# Patient Record
Sex: Female | Born: 1985 | Race: Black or African American | Hispanic: No | Smoking: Current every day smoker
Health system: Southern US, Community
[De-identification: ages and names within clinical notes are randomized; demographics above are authoritative.]

## PROBLEM LIST (undated history)

## (undated) ENCOUNTER — Inpatient Hospital Stay (HOSPITAL_COMMUNITY): Payer: Self-pay

## (undated) DIAGNOSIS — E785 Hyperlipidemia, unspecified: Secondary | ICD-10-CM

## (undated) DIAGNOSIS — F319 Bipolar disorder, unspecified: Secondary | ICD-10-CM

## (undated) DIAGNOSIS — B999 Unspecified infectious disease: Secondary | ICD-10-CM

## (undated) DIAGNOSIS — F419 Anxiety disorder, unspecified: Secondary | ICD-10-CM

## (undated) DIAGNOSIS — I209 Angina pectoris, unspecified: Secondary | ICD-10-CM

## (undated) DIAGNOSIS — Z9289 Personal history of other medical treatment: Secondary | ICD-10-CM

## (undated) DIAGNOSIS — E0500 Thyrotoxicosis with diffuse goiter without thyrotoxic crisis or storm: Secondary | ICD-10-CM

## (undated) DIAGNOSIS — I219 Acute myocardial infarction, unspecified: Secondary | ICD-10-CM

## (undated) DIAGNOSIS — R000 Tachycardia, unspecified: Secondary | ICD-10-CM

## (undated) DIAGNOSIS — D649 Anemia, unspecified: Secondary | ICD-10-CM

## (undated) DIAGNOSIS — F329 Major depressive disorder, single episode, unspecified: Secondary | ICD-10-CM

## (undated) DIAGNOSIS — L732 Hidradenitis suppurativa: Secondary | ICD-10-CM

## (undated) DIAGNOSIS — I10 Essential (primary) hypertension: Secondary | ICD-10-CM

## (undated) DIAGNOSIS — E079 Disorder of thyroid, unspecified: Secondary | ICD-10-CM

## (undated) DIAGNOSIS — G629 Polyneuropathy, unspecified: Secondary | ICD-10-CM

## (undated) DIAGNOSIS — I2510 Atherosclerotic heart disease of native coronary artery without angina pectoris: Secondary | ICD-10-CM

## (undated) DIAGNOSIS — E0590 Thyrotoxicosis, unspecified without thyrotoxic crisis or storm: Secondary | ICD-10-CM

## (undated) DIAGNOSIS — F32A Depression, unspecified: Secondary | ICD-10-CM

## (undated) HISTORY — DX: Thyrotoxicosis with diffuse goiter without thyrotoxic crisis or storm: E05.00

## (undated) HISTORY — DX: Hyperlipidemia, unspecified: E78.5

## (undated) HISTORY — PX: DILATION AND CURETTAGE OF UTERUS: SHX78

## (undated) HISTORY — PX: ADENOIDECTOMY: SUR15

## (undated) HISTORY — DX: Personal history of other medical treatment: Z92.89

## (undated) HISTORY — DX: Atherosclerotic heart disease of native coronary artery without angina pectoris: I25.10

## (undated) HISTORY — DX: Disorder of thyroid, unspecified: E07.9

## (undated) HISTORY — DX: Tachycardia, unspecified: R00.0

## (undated) HISTORY — PX: APPENDECTOMY: SHX54

## (undated) HISTORY — PX: TONSILLECTOMY: SUR1361

## (undated) HISTORY — PX: OTHER SURGICAL HISTORY: SHX169

## (undated) HISTORY — DX: Thyrotoxicosis, unspecified without thyrotoxic crisis or storm: E05.90

---
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
## 2009-05-18 ENCOUNTER — Emergency Department (HOSPITAL_COMMUNITY): Admission: EM | Admit: 2009-05-18 | Discharge: 2009-05-18 | Payer: Self-pay | Admitting: Emergency Medicine

## 2009-05-21 ENCOUNTER — Encounter (INDEPENDENT_AMBULATORY_CARE_PROVIDER_SITE_OTHER): Payer: Self-pay | Admitting: Family Medicine

## 2009-05-21 ENCOUNTER — Inpatient Hospital Stay (HOSPITAL_COMMUNITY): Admission: EM | Admit: 2009-05-21 | Discharge: 2009-05-24 | Payer: Self-pay | Admitting: Emergency Medicine

## 2009-06-11 ENCOUNTER — Emergency Department (HOSPITAL_COMMUNITY): Admission: EM | Admit: 2009-06-11 | Discharge: 2009-06-11 | Payer: Self-pay | Admitting: Emergency Medicine

## 2009-11-17 ENCOUNTER — Ambulatory Visit: Payer: Self-pay | Admitting: Family Medicine

## 2009-11-17 DIAGNOSIS — E669 Obesity, unspecified: Secondary | ICD-10-CM

## 2009-11-17 DIAGNOSIS — E118 Type 2 diabetes mellitus with unspecified complications: Secondary | ICD-10-CM

## 2009-11-17 DIAGNOSIS — N898 Other specified noninflammatory disorders of vagina: Secondary | ICD-10-CM | POA: Insufficient documentation

## 2009-11-17 DIAGNOSIS — I10 Essential (primary) hypertension: Secondary | ICD-10-CM | POA: Insufficient documentation

## 2009-11-17 DIAGNOSIS — J45909 Unspecified asthma, uncomplicated: Secondary | ICD-10-CM | POA: Insufficient documentation

## 2009-11-17 LAB — CONVERTED CEMR LAB
ALT: 12 units/L (ref 0–35)
AST: 14 units/L (ref 0–37)
Albumin: 4.2 g/dL (ref 3.5–5.2)
Alkaline Phosphatase: 147 units/L — ABNORMAL HIGH (ref 39–117)
BUN: 8 mg/dL (ref 6–23)
Basophils Absolute: 0 10*3/uL (ref 0.0–0.1)
Beta hcg, urine, semiquantitative: NEGATIVE
Bilirubin Urine: NEGATIVE
Blood Glucose, Fingerstick: 144
Blood in Urine, dipstick: NEGATIVE
Hgb A1c MFr Bld: 6.9 %
Ketones, urine, test strip: NEGATIVE
Lymphocytes Relative: 43 % (ref 12–46)
Lymphs Abs: 3.4 10*3/uL (ref 0.7–4.0)
Neutro Abs: 3.8 10*3/uL (ref 1.7–7.7)
Neutrophils Relative %: 49 % (ref 43–77)
Platelets: 357 10*3/uL (ref 150–400)
Potassium: 4.3 meq/L (ref 3.5–5.3)
RDW: 14.4 % (ref 11.5–15.5)
Rapid HIV Screen: NEGATIVE
Sodium: 139 meq/L (ref 135–145)
Specific Gravity, Urine: >=1.03
Total Protein: 7.2 g/dL (ref 6.0–8.3)
Vit D, 25-Hydroxy: 12 ng/mL — ABNORMAL LOW (ref 30–89)
WBC: 7.8 10*3/uL (ref 4.0–10.5)

## 2009-11-18 ENCOUNTER — Ambulatory Visit: Payer: Self-pay | Admitting: Family Medicine

## 2009-11-18 DIAGNOSIS — K219 Gastro-esophageal reflux disease without esophagitis: Secondary | ICD-10-CM | POA: Insufficient documentation

## 2009-11-18 DIAGNOSIS — N920 Excessive and frequent menstruation with regular cycle: Secondary | ICD-10-CM

## 2009-11-18 DIAGNOSIS — N760 Acute vaginitis: Secondary | ICD-10-CM | POA: Insufficient documentation

## 2009-11-18 DIAGNOSIS — E559 Vitamin D deficiency, unspecified: Secondary | ICD-10-CM | POA: Insufficient documentation

## 2009-11-18 LAB — CONVERTED CEMR LAB
Cholesterol: 176 mg/dL (ref 0–200)
HDL: 37 mg/dL — ABNORMAL LOW (ref >39)
Triglycerides: 62 mg/dL (ref <150)
VLDL: 12 mg/dL (ref 0–40)

## 2009-11-22 ENCOUNTER — Encounter (INDEPENDENT_AMBULATORY_CARE_PROVIDER_SITE_OTHER): Payer: Self-pay | Admitting: Family Medicine

## 2009-12-08 ENCOUNTER — Ambulatory Visit: Payer: Self-pay | Admitting: Family Medicine

## 2009-12-08 DIAGNOSIS — E7800 Pure hypercholesterolemia, unspecified: Secondary | ICD-10-CM

## 2009-12-08 DIAGNOSIS — L732 Hidradenitis suppurativa: Secondary | ICD-10-CM

## 2009-12-08 LAB — CONVERTED CEMR LAB: Blood Glucose, Fingerstick: 179

## 2009-12-19 ENCOUNTER — Telehealth (INDEPENDENT_AMBULATORY_CARE_PROVIDER_SITE_OTHER): Payer: Self-pay | Admitting: *Deleted

## 2009-12-28 ENCOUNTER — Ambulatory Visit: Payer: Self-pay | Admitting: Family Medicine

## 2010-01-05 ENCOUNTER — Encounter (INDEPENDENT_AMBULATORY_CARE_PROVIDER_SITE_OTHER): Payer: Self-pay | Admitting: Family Medicine

## 2011-01-21 ENCOUNTER — Encounter: Payer: Self-pay | Admitting: Family Medicine

## 2011-01-30 NOTE — Letter (Signed)
Summary: TEST ORDER FORM//[facility]TRASOUND//MENORRHAGIA//APPT DATE & TIME  TEST ORDER FORM//ULTRASOUND//MENORRHAGIA//APPT DATE & TIME   Imported By: Arta Bruce 01/25/2010 10:15:02  _____________________________________________________________________  External Attachment:    Type:   Image     Comment:   External Document

## 2011-01-30 NOTE — Letter (Signed)
Summary: Discharge Summary  Discharge Summary   Imported By: Arta Bruce 01/03/2010 14:25:19  _____________________________________________________________________  External Attachment:    Type:   Image     Comment:   External Document

## 2011-01-30 NOTE — Letter (Signed)
Summary: TEST ORDER FROM//ULTRASOUND//APPT DATE & TIME  TEST ORDER FROM//ULTRASOUND//APPT DATE & TIME   Imported By: Arta Bruce 03/13/2010 16:37:07  _____________________________________________________________________  External Attachment:    Type:   Image     Comment:   External Document

## 2011-04-10 LAB — PHOSPHORUS: Phosphorus: 3.3 mg/dL (ref 2.3–4.6)

## 2011-04-10 LAB — URINALYSIS, ROUTINE W REFLEX MICROSCOPIC
Ketones, ur: NEGATIVE mg/dL
Leukocytes, UA: NEGATIVE
Nitrite: NEGATIVE
Protein, ur: NEGATIVE mg/dL
pH: 5.5 (ref 5.0–8.0)

## 2011-04-10 LAB — CBC
Platelets: 298 10*3/uL (ref 150–400)
RDW: 13.1 % (ref 11.5–15.5)

## 2011-04-10 LAB — GLUCOSE, CAPILLARY
Glucose-Capillary: 117 mg/dL — ABNORMAL HIGH (ref 70–99)
Glucose-Capillary: 140 mg/dL — ABNORMAL HIGH (ref 70–99)
Glucose-Capillary: 142 mg/dL — ABNORMAL HIGH (ref 70–99)
Glucose-Capillary: 197 mg/dL — ABNORMAL HIGH (ref 70–99)

## 2011-04-10 LAB — POCT I-STAT, CHEM 8
Creatinine, Ser: 0.7 mg/dL (ref 0.4–1.2)
Hemoglobin: 13.6 g/dL (ref 12.0–15.0)
Sodium: 140 mEq/L (ref 135–145)
TCO2: 22 mmol/L (ref 0–100)

## 2011-04-10 LAB — CULTURE, BLOOD (ROUTINE X 2): Culture: NO GROWTH

## 2011-04-10 LAB — DIFFERENTIAL
Basophils Absolute: 0 10*3/uL (ref 0.0–0.1)
Lymphocytes Relative: 19 % (ref 12–46)
Neutro Abs: 7.1 10*3/uL (ref 1.7–7.7)
Neutrophils Relative %: 74 % (ref 43–77)

## 2011-04-10 LAB — URINE CULTURE

## 2011-04-10 LAB — MAGNESIUM: Magnesium: 1.8 mg/dL (ref 1.5–2.5)

## 2011-04-10 LAB — LIPID PANEL: Triglycerides: 94 mg/dL (ref <150)

## 2011-04-10 LAB — COMPREHENSIVE METABOLIC PANEL
ALT: 14 U/L (ref 0–35)
Calcium: 8.9 mg/dL (ref 8.4–10.5)
GFR calc Af Amer: >60 mL/min (ref >60)
Glucose, Bld: 139 mg/dL — ABNORMAL HIGH (ref 70–99)
Sodium: 141 mEq/L (ref 135–145)
Total Protein: 6.2 g/dL (ref 6.0–8.3)

## 2011-04-10 LAB — PROTIME-INR: Prothrombin Time: 13.1 seconds (ref 11.6–15.2)

## 2011-04-10 LAB — APTT: aPTT: 37 seconds (ref 24–37)

## 2011-04-10 LAB — HEMOGLOBIN A1C: Hgb A1c MFr Bld: 7.3 % — ABNORMAL HIGH (ref 4.6–6.1)

## 2011-04-10 LAB — TSH: TSH: 1.776 u[IU]/mL (ref 0.350–4.500)

## 2011-05-15 NOTE — Discharge Summary (Signed)
Michelle Osborn, Michelle Osborn NO.:  [id number]   MEDICAL RECORD NO.:  [id number]          PATIENT TYPE:  INP   LOCATION:  5152                         FACILITY:  [facility]   PHYSICIAN:  Charlestine Massed, MDDATE OF BIRTH:  Dec 04, 1986   DATE OF ADMISSION:  05/21/2009  DATE OF DISCHARGE:  05/24/2009                               DISCHARGE SUMMARY   PRIMARY CARE PHYSICIAN:  [facility]   ENT:  Jefry H. Pollyann Kennedy, MD, [facility].   DISCHARGE DIAGNOSES:  1. Bilateral otitis externa and bilateral otitis media with no      evidence of cholesteatoma.  2. Bilateral temporomandibular joint dysfunction, left more than      right.  3. Diabetes mellitus.  4. Hypertension.  5. Obesity.  6. History of asthma.   CURRENT DISCHARGE MEDICATIONS:  1. Clindamycin 600 mg p.o. t.i.d. for 3 days.  2. Levaquin 500 mg p.o. daily for 3 days.  3. Zyrtec 10 mg p.o. daily x2 weeks.  4. Cortisporin ear drops 3 drops to both ears 3 times per day for 2      more days.   The existing medications to be continued are:  1. Lisinopril 20 mg p.o. daily.  2. Metformin 1000 mg p.o. b.i.d.  3. Advair 250/50 one puff b.i.d.  4. Albuterol MDI 2 puffs twice daily p.r.n. for wheeze.  5. Glucotrol XL 2.5 mg p.o. daily.  6. Trazodone 100 mg p.o. at bedtime.   ALLERGIES:  She is allergic to PENICILLIN.  She gets anaphylaxis with  PENICILLIN.  She was admitted 5 years ago in a general hospital when she  was given some form of PENICILLIN and she had severe wheezing and  extreme chest discomfort and was given nebulizers and treated for that,  so she should be labeled as severe allergies to PENICILLIN.  She is also  allergic to PEANUTS.   HOSPITAL COURSE:  1. Ear infection.  CAT scan and ENT examination was done.  The      conclusion, she has bilateral otitis externa and bilateral otitis      media with effusion and bilateral mastoiditis.  She has been seen      by ENT, Dr. Pollyann Kennedy, who advised her  to continue Cortisporin ear      drops for 2 more days for a total of 3 days, and to see him back in      the office in 1 month's time.  He has given his card to her and she      has been advised to call and make appointment.  She will also      continue clindamycin for 3 more days.  She has completed 2 days of      antibiotics IV and she will also continue Levaquin for 3 more days.      She has completed 2 days IV.  She has also been advised to take      Zyrtec for 2 weeks because of eustachian catarrh and she is feeling      a lot better.  Auto-insufflation exercises have been taught by the  ENT physician and she is currently doing that and she is clinically      improved.  Medication adherence has been taught to her and she      exhibits understanding.  2. Bilateral temporomandibular joint dysfunction.  The patient needs      to be followed up with a dentist for further followup as per ENT.      The patient exhibits understanding and she will make an appointment      after she goes back.  3. Tobacco abuse.  The patient has been educated very well about      stopping smoking and she exhibits understanding.  She said she will      stop it.  4. Diabetes mellitus.  Blood sugars are currently controlled.      Continue same metformin and Glucotrol XL.  5. History of asthma.  Continue Advair 250/50 daily.  Finally, her      status is stable.  6. Hypertension.  Blood pressure is controlled.  Continue lisinopril      daily.  7. Questionable small dot on her right side of the posterior abdominal      wall.  The patient does not know whether she had any tick bite or      anything, but she has a small area with a dot with a punctum in the      middle and there is some exudation as well as punctum with erythema      around.  I have given her 1 dose of doxycycline.  She is not      homeless.  She has a house and she has a very remote chance of any      tick bite.  I strongly think it could be  a sebaceous cyst which      could be possibly infected and should get resolved by antibiotics,      and she has been advised to follow up with Dr. Despina Arias, also report      to the [facility] for any further followup on that.      Currently, no active management needed on that.   DISPOSITION:  Discharged back home.   FOLLOWUP:  1. Follow up with [facility] at the next appointment available.  2. Follow up with Dr. Pollyann Kennedy in 1 month.  Call [phone] for      appointment.  3. Follow up with dentist for TMJ issues.   A total of 40 minutes spent on this discharge.      Charlestine Massed, MD  Electronically Signed     UT/MEDQ  D:  05/24/2009  T:  05/25/2009  Job:  [id number]   cc:   Jeannett Senior. Pollyann Kennedy, MD  [facility]

## 2011-05-15 NOTE — Consult Note (Signed)
[name], Michelle Osborn               ACCOUNT NO.:  [id number]   MEDICAL RECORD NO.:  [id number]          PATIENT TYPE:  INP   LOCATION:  5152                         FACILITY:  [facility]   PHYSICIAN:  Jefry H. Pollyann Kennedy, MD     DATE OF BIRTH:  06-23-1986   DATE OF CONSULTATION:  05/23/2009  DATE OF DISCHARGE:                                 CONSULTATION   REASON FOR CONSULTATION:  Mastoiditis.   HISTORY:  This is a 25 year old female who was admitted to the hospital  for severe ear pain and otitis media that was not clearing on  antibiotic.  Prior to Wednesday, she was doing well and then on  Wednesday, she started to develop some hearing loss bilaterally with  bilateral ear pain, pain on chewing, and was initially treated with  antibiotics as an outpatient, but she was noncompliant with her  antibiotic and was then return to the emergency room where she was  admitted for continued treatment.   PAST MEDICAL HISTORY:  Significant for diabetes, hypertension, obesity.  She has a long history of itching of the ears and using Q-tips to  scratch.  She underwent a CT scan of the temporal bone, which revealed  fluid in the mastoid bilaterally and in the middle ear.  There is no  evidence coalescence or any bony destruction to suggest cholesteatoma.   PHYSICAL EXAMINATION:  She is an obese young lady in no distress.  No  palpable neck masses.  She has significant hirsutism.  The oral cavity  and pharynx are clear.  Nasal exam unremarkable.  Ears:  The ear canals  on both sides are worse, on the left reveal some surface exudate and  moist desquamation.  There is no granulation tissue identified.  The  tympanic membranes are intact and there is serous effusion bilaterally.  There is no evidence of injection or erythema of the drums.  There is no  tenderness of the mastoid, although she is tender in the left auricle.  She is also severely tender in the TMJ bilaterally, but worse on the  left side.   When she opens her mouth, she is exquisitely tender in the  left TMJ.   IMPRESSION:  Bilateral external otitis, worse on the left, bilateral  otitis media, serous without evidence of cholesteatoma clinically or  radiographically.  There is also evidence of bilateral temporomandibular  joint dysfunction, also worse on the left.  Recommend she stop using Q-  tips or any other instruments in the ears.  Recommend Cortisporin drops  for the next 3-5 days to treat the external otitis.  Recommend auto  inflation exercises for the middle ear effusion.  Recommend she consult  with a dentist for the TMJ dysfunction.  She has not been to a dentist  in quite some time.  Recommend she find a dentist and have a complete  dental examination.  Recommend a followup with me in about 1 month in  the office.      Jefry H. Pollyann Kennedy, MD  Electronically Signed     JHR/MEDQ  D:  05/23/2009  T:  05/24/2009  Job:  [id number]

## 2011-05-15 NOTE — H&P (Signed)
Michelle Osborn, Michelle Osborn NO.:  [id number]   MEDICAL RECORD NO.:  [id number]          PATIENT TYPE:  INP   LOCATION:  1824                         FACILITY:  [facility]   PHYSICIAN:  Manus Gunning, MD      DATE OF BIRTH:  [birth year]   DATE OF ADMISSION:  05/21/2009  DATE OF DISCHARGE:                              HISTORY & PHYSICAL   CHIEF COMPLAINT:  Bilateral ear pain and headache.   HISTORY OF PRESENT ILLNESS:  Michelle Osborn is a pleasant 25 year old African  American female who presented to the ED this past Wednesday at which  time she was diagnosed with otitis media, treated with doxycycline and  prescribed Percocet for pain.  Unfortunately, she has been taking these  medications, despite which she continues to experience pain which  gradually progressed to the right side of her ear as well.  It pains her  to move her head, pains her to chew and she has begun to experience  nausea and vomiting.  She presented to the emergency department at which  time her temperature was measured at 100.5 degrees Fahrenheit, and a CT  scan of her head demonstrated bilateral otitis media with mastoiditis,  left greater than right, with no definite signs to suggest  cholesteatoma.  The patient, herself, does complain of headache.  No  syncope, no presyncope, lightheadedness, no odynophagia, dysphagia. Does  have painful chewing, trismus. No muscle spasms.  No blurring of vision.  She does complain of superficial chest pain as the pain radiated  bilaterally but correlates this with worsening pain as it spread from  the left side to the right side.  She denies shortness of breath.  Denies dyspnea on exertion and PND.  Denies orthopnea and no  palpitations.  No other musculoskeletal complaints, abdominal pain,  positive nausea and vomiting starting to day x1 episode. No dysuria,  polyuria, hematuria.  No bright red blood per rectum. No melenic stools.  No diarrhea or constipation.   PAST  MEDICAL/SURGICAL HISTORY:  1. History of asthma.  2. Diabetes mellitus type 2.  3. Hypertension.  4. Tonsillectomy.  5. Adenoidectomy.  6. Appendectomy.   ALLERGIES:  PENICILLIN.   SOCIAL HISTORY:  Smokes 1 pack which lasts her for 4 days.  Smoking for  approximately 6 years.  Denies any illicit drug use.  Drinks socially  only.  Is currently married, no children.   FAMILY HISTORY:  Mother had asthma and hypertension.  Father had  hypertension.   HOME MEDICATIONS:  1. Metformin 1000 mg twice daily.  2. Lisinopril 20 mg daily.  3. Advair 250/50 mcg one puff daily.  4. Albuterol meter dose inhaler 2 puffs twice daily.  5. Glucotrol XL 2.5 mg p.o. daily (dose patient is not sure of).   REVIEW OF SYSTEMS:  Essentially a 14-point review of systems is  performed. Pertinent positives and negatives as described above.   PHYSICAL EXAMINATION:  VITAL SIGNS:  At the time of presentation,  temperature 100.5, heart rate 106, respiratory rate 20, blood pressure  142/94,  O2 saturation is 99% in room air.  GENERAL:  Well-developed, well-nourished African American lady lying in  bed comfortably in no apparent distress.  HEENT:  Normocephalic, atraumatic.  Does complain of bilateral ear pain  on palpation.  Has palpable lymphadenopathy in the jugular site as well  as retroauricular.  Eyes anicteric.  Extraocular muscles are intact.  Pupils are equal, react to light and accommodation.  CARDIOVASCULAR:  S1 and S2 normal, regular rate and rhythm.  No murmurs,  rubs or gallops.  RESPIRATORY:  Air entry bilaterally equal.  No rales, rhonchi or wheezes  appreciated.  ABDOMEN:  Soft and nontender.  Nondistended.  Positive bowel sounds. No  organomegaly.  EXTREMITIES:  No clubbing, cyanosis, or edema.  Positive bilateral  dorsalis pedis pulses.  CNS:  Alert and oriented x3.  Cranial nerves II through XII grossly  intact.  Power, sensation and reflexes bilaterally symmetrical.  SKIN:  No  breakdown, swelling, ulceration or masses.  HEMATOLOGY/ONCOLOGY:  No hepatosplenomegaly, no ecchymoses, bruising or  petechia.  Positive retroauricular and jugular lymphadenopathy.   LABORATORY DATA:  Urinalysis demonstrates 0-2 rbc's, 3-6 wbc's with a  negative urinalysis.  CT scan of the orbits, temporal bones demonstrates  bilateral otitis media and mastoiditis, left greater than right.  Chest  x-ray, 2 views, demonstrates no active cardiopulmonary disease.  PTT is  37, INR 1, pro time 13.1.  Sodium 140, potassium 4, chloride 107, BUN 5,  creatinine 0.7, glucose 124, calcium 1.11.  Hemoglobin 13.6, hematocrit  40, WBC 9,600 and platelet count 298,000 with 74% polymorphs, no  bandemia.   ASSESSMENT AND PLAN:  1. The patient has otitis media with mastoiditis, failed outpatient      therapy.  Start Rocephin 2 grams IV q.24 hours and clindamycin 450      mg IV q.6 hours.  I will have to confirm and make sure that the      patient is not allergic to Keflex as the patient, during my      interview, claimed only that she was allergic to penicillins but      apparently Keflex is on her  chart.  If this is the case, we will      discontinue Rocephin as well and continue only clindamycin. The      patient will need an ENT consultation in the morning.  At this      time, no steroids will be prescribed.  This can be addressed by      ENT.  2. Diabetes mellitus type 2.  Check hemoglobin A1c.  Patient placed on      sliding scale insulin protocol, sensitive scale.  Continue      metformin and Glucotrol. Check a fasting lipid profile as well.  3. Gastrointestinal and deep vein thrombosis prophylaxis:  Protonix 40      mg p.o. daily and heparin 5000 units subcu q.8 hours.      Manus Gunning, MD  Electronically Signed     SP/MEDQ  D:  05/21/2009  T:  05/22/2009  Job:  [id number]

## 2011-05-18 NOTE — Op Note (Signed)
NAME:  Michelle Osborn, Michelle Osborn                         ACCOUNT NO.:  [id number]   MEDICAL RECORD NO.:  [id number]                   PATIENT TYPE:  AMB   LOCATION:  DSC                                  FACILITY:  [facility]   PHYSICIAN:  Kinnie Scales. Annalee Genta, M.D.            DATE OF BIRTH:  09/29/1986   DATE OF PROCEDURE:  04/22/2003  DATE OF DISCHARGE:                                 OPERATIVE REPORT   PREOPERATIVE DIAGNOSIS:  1. Recurrent acute tonsillitis.  2. Adenotonsillar hypertrophy.   POSTOPERATIVE DIAGNOSIS:  1. Recurrent acute tonsillitis.  2. Adenotonsillar hypertrophy.   OPERATION PERFORMED:  Tonsillectomy and adenoidectomy (adenoid ablation).   SURGEON:  Kinnie Scales. Annalee Genta, M.D.   ANESTHESIA:  General endotracheal.   COMPLICATIONS:  None.   ESTIMATED BLOOD LOSS:  Minimal.   The patient was transferred from the operating room to the recovery room in  stable condition.  The patient will follow up in my office in two weeks for  postoperative care.   INDICATIONS FOR PROCEDURE:  The patient is an almost 25 year old black  female who was referred for evaluation of significant adenotonsillar  hypertrophy, recurrent tonsillitis, snoring with possible mild obstructive  sleep apnea.  The patient had a past medical history of mild reactive airway  disease, obesity and borderline hypertension. Given her history and  examination, I recommended that we consider her for tonsillectomy and  adenoidectomy under general anesthesia.  The risks, benefits and possible  complications of these procedures were discussed in detail with the patient  and her mother, who understood and concurred with our plan for surgery which  is scheduled for April 22, 2003.   DESCRIPTION OF PROCEDURE:  The patient was brought to the operating room on  April 22, 2003 at [facility].  General endotracheal  anesthesia was established without difficulty.  When the patient was  adequately  anesthetized, the Crowe-Davis mouth gag was inserted.  There are  no loose or broken teeth and the hard and soft palate were intact.  The  procedure was begun with adenoidectomy.  Beginning on the patient's left  hand side, using the [employer] scalpel and dissecting in subcapsular  fashion, the entire left tonsil from superior pole to tongue base was  resected.  The right tonsil was removed in a similar fashion and tonsillar  tissue was sent to pathology for gross microscopic evaluation.  The tonsil  was removed.  Access was gained to the posterior nasopharynx.  A red rubber  catheter was inserted without difficulty through the right nasal chamber and  Bovie suction cautery was used to ablate the adenoid tissue.  With complete  ablation of the adenoids, the nasopharynx was widely patent.  There was no  evidence of active bleeding.  The patient's oral cavity, oropharynx, nasal  cavity and nasopharynx were irrigated with saline solution and suctioned.  A  dry tonsil sponge was used to gently  abrade the tonsillar fossae and several  small areas of bleeding which were then cauterized with suction cautery.  An  orogastric tube was passed and the stomach contents were aspirated.  The  Crowe-Davis mouth gag was released and reapplied.  There was no active  bleeding.  The mouth gag was removed.  There were no loose or broken teeth.  The patient was then awakened from her anesthetic, extubated and was  transferred from the operating room to the recovery room in stable  condition.  There were no complications.  The estimated blood loss was  minimal.                                               Onalee Hua L. Annalee Genta, M.D.    DLS/MEDQ  D:  16/10/9603  T:  04/22/2003  Job:  [id number]

## 2012-02-05 ENCOUNTER — Emergency Department (HOSPITAL_COMMUNITY)
Admission: EM | Admit: 2012-02-05 | Discharge: 2012-02-05 | Disposition: A | Discharge status: Home / Self Care | Payer: Self-pay | Attending: Emergency Medicine | Admitting: Emergency Medicine

## 2012-02-05 ENCOUNTER — Encounter (HOSPITAL_COMMUNITY): Payer: Self-pay | Admitting: Emergency Medicine

## 2012-02-05 ENCOUNTER — Emergency Department (HOSPITAL_COMMUNITY): Payer: Self-pay

## 2012-02-05 DIAGNOSIS — R300 Dysuria: Secondary | ICD-10-CM | POA: Insufficient documentation

## 2012-02-05 DIAGNOSIS — F17200 Nicotine dependence, unspecified, uncomplicated: Secondary | ICD-10-CM | POA: Insufficient documentation

## 2012-02-05 DIAGNOSIS — E119 Type 2 diabetes mellitus without complications: Secondary | ICD-10-CM | POA: Insufficient documentation

## 2012-02-05 DIAGNOSIS — R1031 Right lower quadrant pain: Secondary | ICD-10-CM | POA: Insufficient documentation

## 2012-02-05 DIAGNOSIS — I10 Essential (primary) hypertension: Secondary | ICD-10-CM | POA: Insufficient documentation

## 2012-02-05 DIAGNOSIS — N898 Other specified noninflammatory disorders of vagina: Secondary | ICD-10-CM | POA: Insufficient documentation

## 2012-02-05 DIAGNOSIS — E669 Obesity, unspecified: Secondary | ICD-10-CM | POA: Insufficient documentation

## 2012-02-05 DIAGNOSIS — O200 Threatened abortion: Secondary | ICD-10-CM | POA: Insufficient documentation

## 2012-02-05 DIAGNOSIS — M549 Dorsalgia, unspecified: Secondary | ICD-10-CM | POA: Insufficient documentation

## 2012-02-05 DIAGNOSIS — R10819 Abdominal tenderness, unspecified site: Secondary | ICD-10-CM | POA: Insufficient documentation

## 2012-02-05 DIAGNOSIS — M79609 Pain in unspecified limb: Secondary | ICD-10-CM | POA: Insufficient documentation

## 2012-02-05 DIAGNOSIS — J45909 Unspecified asthma, uncomplicated: Secondary | ICD-10-CM | POA: Insufficient documentation

## 2012-02-05 HISTORY — DX: Essential (primary) hypertension: I10

## 2012-02-05 LAB — CBC
HCT: 34.9 % — ABNORMAL LOW (ref 36.0–46.0)
MCV: 77.4 fL — ABNORMAL LOW (ref 78.0–100.0)
Platelets: 324 10*3/uL (ref 150–400)
RBC: 4.51 MIL/uL (ref 3.87–5.11)
WBC: 7.1 10*3/uL (ref 4.0–10.5)

## 2012-02-05 LAB — COMPREHENSIVE METABOLIC PANEL
ALT: 29 U/L (ref 0–35)
Alkaline Phosphatase: 138 U/L — ABNORMAL HIGH (ref 39–117)
CO2: 22 mEq/L (ref 19–32)
Calcium: 9.4 mg/dL (ref 8.4–10.5)
Chloride: 102 mEq/L (ref 96–112)
GFR calc Af Amer: >90 mL/min (ref >90)
GFR calc non Af Amer: >90 mL/min (ref >90)
Glucose, Bld: 227 mg/dL — ABNORMAL HIGH (ref 70–99)
Sodium: 134 mEq/L — ABNORMAL LOW (ref 135–145)
Total Bilirubin: 0.3 mg/dL (ref 0.3–1.2)

## 2012-02-05 LAB — DIFFERENTIAL
Eosinophils Relative: 1 % (ref 0–5)
Lymphocytes Relative: 41 % (ref 12–46)
Lymphs Abs: 2.9 10*3/uL (ref 0.7–4.0)
Neutro Abs: 3.7 10*3/uL (ref 1.7–7.7)

## 2012-02-05 LAB — WET PREP, GENITAL

## 2012-02-05 LAB — URINALYSIS, ROUTINE W REFLEX MICROSCOPIC
Bilirubin Urine: NEGATIVE
Ketones, ur: 15 mg/dL — AB
Nitrite: NEGATIVE
Protein, ur: NEGATIVE mg/dL
Specific Gravity, Urine: 1.03 (ref 1.005–1.030)
Urobilinogen, UA: 0.2 mg/dL (ref 0.0–1.0)

## 2012-02-05 LAB — GLUCOSE, CAPILLARY: Glucose-Capillary: 203 mg/dL — ABNORMAL HIGH (ref 70–99)

## 2012-02-05 LAB — URINE MICROSCOPIC-ADD ON

## 2012-02-05 LAB — TYPE AND SCREEN

## 2012-02-05 LAB — HCG, QUANTITATIVE, PREGNANCY: hCG, Beta Chain, Quant, S: 130 m[IU]/mL — ABNORMAL HIGH (ref <5)

## 2012-02-05 MED ORDER — IBUPROFEN 200 MG PO TABS
600.0000 mg | ORAL_TABLET | Freq: Once | ORAL | Status: AC
  Administered 2012-02-05: 600 mg via ORAL
  Filled 2012-02-05: qty 3

## 2012-02-05 MED ORDER — SODIUM CHLORIDE 0.9 % IV BOLUS (SEPSIS)
500.0000 mL | Freq: Once | INTRAVENOUS | Status: AC
  Administered 2012-02-05: 500 mL via INTRAVENOUS

## 2012-02-05 MED ORDER — FENTANYL CITRATE 0.05 MG/ML IJ SOLN
50.0000 ug | Freq: Once | INTRAMUSCULAR | Status: AC
  Administered 2012-02-05: 50 ug via INTRAVENOUS
  Filled 2012-02-05: qty 2

## 2012-02-05 MED ORDER — ONDANSETRON HCL 4 MG/2ML IJ SOLN
4.0000 mg | Freq: Once | INTRAMUSCULAR | Status: AC
  Administered 2012-02-05: 4 mg via INTRAVENOUS
  Filled 2012-02-05: qty 2

## 2012-02-05 MED ORDER — ACETAMINOPHEN 500 MG PO TABS: 500.0000 mg | ORAL_TABLET | Freq: Four times a day (QID) | ORAL | Status: DC | PRN

## 2012-02-05 NOTE — ED Provider Notes (Signed)
History     CSN: [id number]  Arrival date & time 02/05/12  1151   First MD Initiated Contact with Patient 02/05/12 1159      Chief Complaint  Patient presents with  . Dysuria  . Abdominal Pain  . Back Pain    (Consider location/radiation/quality/duration/timing/severity/associated sxs/prior treatment) HPI  Patient presents to ER complaining of 2 day hx of gradual onset RLQ abdominal pain with pain in right inguinal region and radiation of pain into right thigh and down to right calf. Patient states she first noticed the pain yesterday that her persisted and worsened. Patient states pain is aggravated by movement of hip and walking, as well as touch of abdomen. Patient states pain is also aggravated by the straining of urination but denies dysuria or hematuria. She states she has a hx of appendectomy but no other abdominal surgeries. She is G0P0 but states that she is not due for her next menstrual cycle until Feb 20th but has been having light pink tinged "either vaginal discharge or pink when I wipe when I pee." Denies pelvic pain or dyspareunia but states she is sexually active. Denies fevers, chills, chest pain, SOB, n/v/d, blood in stool or dysuria. Patient has not taken anything for pain PTA. Patient states she has been dx with DM II and HTN in the past but is not currently taking any medications and is not being managed by a PCP.   Past Medical History  Diagnosis Date  . Hypertension   . Diabetes mellitus   . Asthma     Past Surgical History  Procedure Date  . Appendectomy   . Tonsillectomy   . Adenoidectomy     Family History  Problem Relation Age of Onset  . Hypertension Mother   . Hypertension Father     History  Substance Use Topics  . Smoking status: Current Everyday Smoker -- 1.0 packs/day for 10 years  . Smokeless tobacco: Not on file  . Alcohol Use: No    OB History    Grav Para Term Preterm Abortions TAB SAB Ect Mult Living                  Review  of Systems  All other systems reviewed and are negative.    Allergies  Peanut-containing drug products; Penicillins; Cephalexin; and Propoxyphene n-acetaminophen  Home Medications  No current outpatient prescriptions on file.  BP 143/95  Pulse 89  Temp(Src) 98 F (36.7 C) (Oral)  Resp 17  Ht 5\' 7"  (1.702 m)  Wt 255 lb (115.667 kg)  BMI 39.94 kg/m2  SpO2 100%  LMP 01/21/2012  Physical Exam  Nursing note and vitals reviewed. Constitutional: She is oriented to person, place, and time. She appears well-developed and well-nourished. No distress.       obese  HENT:  Head: Normocephalic and atraumatic.  Eyes: Conjunctivae are normal.  Neck: Normal range of motion. Neck supple.  Cardiovascular: Normal rate, regular rhythm, normal heart sounds and intact distal pulses.  Exam reveals no gallop and no friction rub.   No murmur heard. Pulmonary/Chest: Effort normal and breath sounds normal. No respiratory distress. She has no wheezes. She has no rales. She exhibits no tenderness.  Abdominal: Bowel sounds are normal. She exhibits no distension and no mass. There is tenderness. There is no rebound and no guarding.       TTP of entire right abdomen with RLQ TTP worse but entire right side tender. Guarding but no rigidity or peritoneal.  Genitourinary: Vagina normal. Uterus is tender. Cervix exhibits no motion tenderness, no discharge and no friability. Right adnexum displays tenderness. Right adnexum displays no fullness. Left adnexum displays no mass, no tenderness and no fullness.       Blood tinged thin cervical d/c  Musculoskeletal: Normal range of motion. She exhibits tenderness. She exhibits no edema.       TTP of right anterior thigh and inguinal crease as well as right calf but no skin changes, erythema, crepitous or edema. Good femoral pulse.   Neurological: She is alert and oriented to person, place, and time.  Skin: Skin is warm and dry. No rash noted. She is not diaphoretic. No  erythema.  Psychiatric: She has a normal mood and affect.    ED Course  Procedures (including critical care time)  IV fentanyl, zofran and fluids.   Urine pregnancy POC was ordered immediately after initial evaluation though there was a delay because ED staff sent urine to lab without dipping for pregnancy in ER. After delayed result was recognized, lab upreg was ordered but once again, prolonged time of after initial order to get result of positive.   After positive pregnancy, [place] immediately ordered. VSS. Patient is alert and oriented with pain better controlled.   Labs Reviewed  CBC - Abnormal; Notable for the following:    Hemoglobin 11.9 (*)    HCT 34.9 (*)    MCV 77.4 (*)    All other components within normal limits  COMPREHENSIVE METABOLIC PANEL - Abnormal; Notable for the following:    Sodium 134 (*)    Glucose, Bld 227 (*)    Creatinine, Ser 0.49 (*)    Alkaline Phosphatase 138 (*)    All other components within normal limits  URINALYSIS, ROUTINE W REFLEX MICROSCOPIC - Abnormal; Notable for the following:    Glucose, UA 500 (*)    Hgb urine dipstick TRACE (*)    Ketones, ur 15 (*)    All other components within normal limits  WET PREP, GENITAL - Abnormal; Notable for the following:    Clue Cells Wet Prep HPF POC MODERATE (*)    WBC, Wet Prep HPF POC FEW (*)    All other components within normal limits  GLUCOSE, CAPILLARY - Abnormal; Notable for the following:    Glucose-Capillary 203 (*)    All other components within normal limits  URINE MICROSCOPIC-ADD ON - Abnormal; Notable for the following:    Squamous Epithelial / LPF FEW (*)    Bacteria, UA FEW (*)    All other components within normal limits  PREGNANCY, URINE - Abnormal; Notable for the following:    Preg Test, Ur POSITIVE (*)    All other components within normal limits  DIFFERENTIAL  LIPASE, BLOOD  GC/CHLAMYDIA PROBE AMP, GENITAL  HCG, QUANTITATIVE, PREGNANCY  TYPE AND SCREEN  ABO/RH  PREGNANCY,  URINE   US Ob Comp Less 14 Wks  02/05/2012  *RADIOLOGY REPORT*  Clinical Data: Back pain and dysuria.  LMP 01/21/2012. +UPT with bHCG pending.  OBSTETRIC <14 WK [place] AND TRANSVAGINAL OB US  Technique:  Both transabdominal and transvaginal ultrasound examinations were performed for complete evaluation of the gestation as well as the maternal uterus, adnexal regions, and pelvic cul-de-sac.  Transvaginal technique was performed to assess early pregnancy.  Comparison:  None.  Intrauterine gestational sac:  None is seen Yolk sac: Not applicable Embryo: Not applicable Cardiac Activity: Heart Rate:  bpm  MSD:   mm  w     d CRL:    mm     w     d        [place] EDC:  Maternal uterus/adnexae: The uterus has a sagittal length of 7.6 cm, depth of 3.5 cm and width of 4.7 cm.  A homogeneous uterine myometrium is seen.  The endometrial lining appears trilayered with a width of 10 mm.  No areas of focal thickening or heterogeneity are seen and no signs of an intrauterine gestational sac is noted.  The right ovary measures 2.1 x 3.5 x 2.2 cm and contains several small follicles.  The left ovary is only seen transabdominally and is poorly resolved due to patient habitus measuring 1.8 x 2.1 x 1.7 cm.  A small amount of complex free fluid is noted in the cul-de-sac  IMPRESSION: Tri- layered endometrial stripe with no evidence for an intrauterine gestational sac.  Normal transabdominal appearance to the left ovary and normal endovaginal appearance to the right ovary.  Small amount of complex pelvic fluid.  Given the history of positive urine pregnancy test and suspected early pregnancy, this could represent and early intrauterine pregnancy which is not yet visible, a sonographically silent ectopic gestation or nonprogressing abnormal gestation. Correlation with serial beta HCG is recommended to help differentiate between these diagnostic possibilities with follow-up sonography as indicated.  We would typically expect to see evidence for  an intrauterine gestational sac with a beta HCG 3-D > 1800.  Original Report Authenticated By: Bertha Stakes, M.D.   US Ob Transvaginal  02/05/2012  *RADIOLOGY REPORT*  Clinical Data: Back pain and dysuria.  LMP 01/21/2012. +UPT with bHCG pending.  OBSTETRIC <14 WK [place] AND TRANSVAGINAL OB US  Technique:  Both transabdominal and transvaginal ultrasound examinations were performed for complete evaluation of the gestation as well as the maternal uterus, adnexal regions, and pelvic cul-de-sac.  Transvaginal technique was performed to assess early pregnancy.  Comparison:  None.  Intrauterine gestational sac:  None is seen Yolk sac: Not applicable Embryo: Not applicable Cardiac Activity: Heart Rate:  bpm  MSD:   mm      w     d CRL:    mm     w     d        [place] EDC:  Maternal uterus/adnexae: The uterus has a sagittal length of 7.6 cm, depth of 3.5 cm and width of 4.7 cm.  A homogeneous uterine myometrium is seen.  The endometrial lining appears trilayered with a width of 10 mm.  No areas of focal thickening or heterogeneity are seen and no signs of an intrauterine gestational sac is noted.  The right ovary measures 2.1 x 3.5 x 2.2 cm and contains several small follicles.  The left ovary is only seen transabdominally and is poorly resolved due to patient habitus measuring 1.8 x 2.1 x 1.7 cm.  A small amount of complex free fluid is noted in the cul-de-sac  IMPRESSION: Tri- layered endometrial stripe with no evidence for an intrauterine gestational sac.  Normal transabdominal appearance to the left ovary and normal endovaginal appearance to the right ovary.  Small amount of complex pelvic fluid.  Given the history of positive urine pregnancy test and suspected early pregnancy, this could represent and early intrauterine pregnancy which is not yet visible, a sonographically silent ectopic gestation or nonprogressing abnormal gestation. Correlation with serial beta HCG is recommended to help differentiate between these  diagnostic possibilities with follow-up sonography as indicated.  We would typically expect to see evidence for an intrauterine gestational sac with a beta HCG 3-D > 1800.  Original Report Authenticated By: Bertha Stakes, M.D.     1. Threatened abortion   2. Diabetes mellitus       MDM  Early IUP not seen on [place] vs early ectopic not visualized but patient's abdomen is non acute and her VSS. Afebrile. NAD. Spoke AT LENGTH with patient about GYN follow up and need to return to ER for changing or worsening of symptoms. She voices understanding and is agreeable to plan.         [place], [place] 02/05/12 [phone]

## 2012-02-05 NOTE — ED Notes (Signed)
Pt. Reported of pain upon urination with abdominal pain on RLQ radiating to lower back started 9pm of last night. Also claimed decreased in the amount of urine since pain felt. Denies SOB / chest pain.

## 2012-02-06 ENCOUNTER — Inpatient Hospital Stay (HOSPITAL_COMMUNITY)
Admission: AD | Admit: 2012-02-06 | Discharge: 2012-02-06 | Disposition: A | Discharge status: Home / Self Care | Payer: Self-pay | Source: Ambulatory Visit | Attending: Obstetrics & Gynecology | Admitting: Obstetrics & Gynecology

## 2012-02-06 ENCOUNTER — Encounter (HOSPITAL_COMMUNITY): Payer: Self-pay | Admitting: *Deleted

## 2012-02-06 DIAGNOSIS — O039 Complete or unspecified spontaneous abortion without complication: Secondary | ICD-10-CM | POA: Insufficient documentation

## 2012-02-06 LAB — GC/CHLAMYDIA PROBE AMP, GENITAL
Chlamydia, DNA Probe: NEGATIVE
GC Probe Amp, Genital: NEGATIVE

## 2012-02-06 LAB — CBC
HCT: 35.3 % — ABNORMAL LOW (ref 36.0–46.0)
Hemoglobin: 11.7 g/dL — ABNORMAL LOW (ref 12.0–15.0)
MCV: 79.7 fL (ref 78.0–100.0)
RBC: 4.43 MIL/uL (ref 3.87–5.11)
WBC: 8.6 10*3/uL (ref 4.0–10.5)

## 2012-02-06 LAB — HCG, QUANTITATIVE, PREGNANCY: hCG, Beta Chain, Quant, S: 91 m[IU]/mL — ABNORMAL HIGH (ref <5)

## 2012-02-06 MED ORDER — IBUPROFEN 800 MG PO TABS: 800.0000 mg | ORAL_TABLET | Freq: Three times a day (TID) | ORAL | Status: AC | PRN

## 2012-02-06 MED ORDER — IBUPROFEN 800 MG PO TABS
800.0000 mg | ORAL_TABLET | Freq: Once | ORAL | Status: AC
  Administered 2012-02-06: 800 mg via ORAL
  Filled 2012-02-06: qty 1

## 2012-02-06 MED ORDER — OXYCODONE-ACETAMINOPHEN 5-325 MG PO TABS
2.0000 | ORAL_TABLET | Freq: Once | ORAL | Status: AC
  Administered 2012-02-06: 2 via ORAL
  Filled 2012-02-06: qty 2

## 2012-02-06 MED ORDER — OXYCODONE-ACETAMINOPHEN 5-325 MG PO TABS: 1.0000 | ORAL_TABLET | ORAL | Status: AC | PRN

## 2012-02-06 MED ORDER — OXYCODONE-ACETAMINOPHEN 5-325 MG PO TABS: 2.0000 | ORAL_TABLET | Freq: Once | ORAL | Status: DC

## 2012-02-06 NOTE — ED Provider Notes (Signed)
Medical screening examination/treatment/procedure(s) were performed by non-physician practitioner and as supervising physician I was immediately available for consultation/collaboration.  Geoffery Lyons, MD 02/06/12 [phone]

## 2012-02-06 NOTE — Progress Notes (Signed)
Ivonne Andrew, CNM at bedside.  Lab results discussed with pt.

## 2012-02-06 NOTE — Progress Notes (Signed)
Pt seen at [facility] for abd pain 02/04/2012 for lower right side abd pain.  Pt was told she was pregnant and to follow up at [facility] in 2 days.  Pt continues to have RLQ pain that is constant.  Denies bleeding at this time.

## 2012-02-06 NOTE — Progress Notes (Signed)
V. Smith, CNM at bedside.  Assessment done and poc discussed with pt.  

## 2012-02-06 NOTE — ED Provider Notes (Signed)
History   Michelle Osborn is a 26 y.o. year old G1P0 female at unknown weeks gestation who presents to [facility]U reporting continued low abd pain. Pain is greater in right groin radiating down her right leg. She was seen in [facility] yesterday for the same concerns. Quant 130. Blood type O pos. She denies VB, N/V/D, fever, chills, loss of appetite. She has Hx appendectomy.  CSN: [id number]  Arrival date & time 02/06/12  1841   None     Chief Complaint  Patient presents with  . Abdominal Pain    (Consider location/radiation/quality/duration/timing/severity/associated sxs/prior treatment) HPI  Past Medical History  Diagnosis Date  . Hypertension   . Diabetes mellitus   . Asthma     Past Surgical History  Procedure Date  . Appendectomy   . Tonsillectomy   . Adenoidectomy     Family History  Problem Relation Age of Onset  . Hypertension Mother   . Hypertension Father     History  Substance Use Topics  . Smoking status: Current Everyday Smoker -- 1.0 packs/day for 10 years  . Smokeless tobacco: Not on file  . Alcohol Use: No    OB History    Grav Para Term Preterm Abortions TAB SAB Ect Mult Living   1               Review of Systems  Constitutional: Negative for fever, chills and appetite change.  Gastrointestinal: Positive for abdominal pain. Negative for nausea, vomiting, diarrhea and constipation.  Genitourinary: Negative for dysuria, flank pain and vaginal bleeding.    Allergies  Peanut-containing drug products; Penicillins; and Cephalexin  Home Medications  No current outpatient prescriptions on file.  BP 128/89  Pulse 86  Temp(Src) 98.4 F (36.9 C) (Oral)  Resp 16  Ht 5\' 7"  (1.702 m)  Wt 111.676 kg (246 lb 3.2 oz)  BMI 38.56 kg/m2  LMP 01/21/2012  Physical Exam  Constitutional: She is oriented to person, place, and time. She appears well-developed and well-nourished. She appears distressed (mild).  Cardiovascular: Normal rate.   Pulmonary/Chest: Effort  normal.  Abdominal: Soft. Bowel sounds are normal. She exhibits no distension and no mass. There is tenderness (generalized low abd). There is no rebound and no guarding.  Neurological: She is alert and oriented to person, place, and time.  Skin: Skin is warm and dry.  Psychiatric: She has a normal mood and affect.    ED Course  Procedures (including critical care time)  Labs Reviewed  HCG, QUANTITATIVE, PREGNANCY - Abnormal; Notable for the following:    hCG, Beta Chain, Quant, S 91 (*)    All other components within normal limits  CBC - Abnormal; Notable for the following:    Hemoglobin 11.7 (*)    HCT 35.3 (*)    All other components within normal limits   US Ob Comp Less 14 Wks  02/05/2012  *RADIOLOGY REPORT*  Clinical Data: Back pain and dysuria.  LMP 01/21/2012. +UPT with bHCG pending.  OBSTETRIC <14 WK [place] AND TRANSVAGINAL OB US  Technique:  Both transabdominal and transvaginal ultrasound examinations were performed for complete evaluation of the gestation as well as the maternal uterus, adnexal regions, and pelvic cul-de-sac.  Transvaginal technique was performed to assess early pregnancy.  Comparison:  None.  Intrauterine gestational sac:  None is seen Yolk sac: Not applicable Embryo: Not applicable Cardiac Activity: Heart Rate:  bpm  MSD:   mm      w     d CRL:  mm     w     d        [place] EDC:  Maternal uterus/adnexae: The uterus has a sagittal length of 7.6 cm, depth of 3.5 cm and width of 4.7 cm.  A homogeneous uterine myometrium is seen.  The endometrial lining appears trilayered with a width of 10 mm.  No areas of focal thickening or heterogeneity are seen and no signs of an intrauterine gestational sac is noted.  The right ovary measures 2.1 x 3.5 x 2.2 cm and contains several small follicles.  The left ovary is only seen transabdominally and is poorly resolved due to patient habitus measuring 1.8 x 2.1 x 1.7 cm.  A small amount of complex free fluid is noted in the cul-de-sac   IMPRESSION: Tri- layered endometrial stripe with no evidence for an intrauterine gestational sac.  Normal transabdominal appearance to the left ovary and normal endovaginal appearance to the right ovary.  Small amount of complex pelvic fluid.  Given the history of positive urine pregnancy test and suspected early pregnancy, this could represent and early intrauterine pregnancy which is not yet visible, a sonographically silent ectopic gestation or nonprogressing abnormal gestation. Correlation with serial beta HCG is recommended to help differentiate between these diagnostic possibilities with follow-up sonography as indicated.  We would typically expect to see evidence for an intrauterine gestational sac with a beta HCG 3-D > 1800.  Original Report Authenticated By: Bertha Stakes, M.D.   1. SAB (spontaneous abortion)     MDM  Pt very tearful. Has been TTC for several years. D/C home Support given F/U in [facility]U for quant on 02/09/12 SAB precautions  Dorathy Kinsman 02/06/2012 10:43 PM

## 2012-02-20 ENCOUNTER — Emergency Department (HOSPITAL_COMMUNITY)
Admission: EM | Admit: 2012-02-20 | Discharge: 2012-02-20 | Disposition: A | Discharge status: Home / Self Care | Payer: Self-pay | Attending: Emergency Medicine | Admitting: Emergency Medicine

## 2012-02-20 ENCOUNTER — Encounter (HOSPITAL_COMMUNITY): Payer: Self-pay | Admitting: *Deleted

## 2012-02-20 DIAGNOSIS — N61 Mastitis without abscess: Secondary | ICD-10-CM | POA: Insufficient documentation

## 2012-02-20 DIAGNOSIS — F17200 Nicotine dependence, unspecified, uncomplicated: Secondary | ICD-10-CM | POA: Insufficient documentation

## 2012-02-20 DIAGNOSIS — L989 Disorder of the skin and subcutaneous tissue, unspecified: Secondary | ICD-10-CM | POA: Insufficient documentation

## 2012-02-20 DIAGNOSIS — I10 Essential (primary) hypertension: Secondary | ICD-10-CM | POA: Insufficient documentation

## 2012-02-20 DIAGNOSIS — E119 Type 2 diabetes mellitus without complications: Secondary | ICD-10-CM | POA: Insufficient documentation

## 2012-02-20 DIAGNOSIS — N611 Abscess of the breast and nipple: Secondary | ICD-10-CM

## 2012-02-20 MED ORDER — SULFAMETHOXAZOLE-TRIMETHOPRIM 800-160 MG PO TABS: 2.0000 | ORAL_TABLET | Freq: Two times a day (BID) | ORAL | Status: AC

## 2012-02-20 MED ORDER — HYDROCODONE-ACETAMINOPHEN 5-325 MG PO TABS
1.0000 | ORAL_TABLET | Freq: Once | ORAL | Status: AC
  Administered 2012-02-20: 1 via ORAL
  Filled 2012-02-20: qty 1

## 2012-02-20 MED ORDER — HYDROCODONE-ACETAMINOPHEN 5-325 MG PO TABS: 1.0000 | ORAL_TABLET | ORAL | Status: AC | PRN

## 2012-02-20 NOTE — ED Notes (Signed)
Pt with multiple abscesses developiing a couple of days ago, noted in breast area and groin

## 2012-02-20 NOTE — Discharge Instructions (Signed)
Please call the surgeon listed above for a follow up appointment.  Use warm moist compresses on your abscesses every 2-3 hours.  Please see the surgeon (or return to the ER if you are unable to see the surgeon) for a recheck in 2-3 days.  You may return to the ER at any time for worsening condition or any new symptoms that concern you.

## 2012-02-21 NOTE — ED Provider Notes (Signed)
Medical screening examination/treatment/procedure(s) were performed by non-physician practitioner and as supervising physician I was immediately available for consultation/collaboration. Devoria Albe, MD, FACEP   Ward Givens, MD 02/21/12 1025

## 2012-02-21 NOTE — ED Provider Notes (Signed)
History     CSN: [id number]  Arrival date & time 02/20/12  9562   First MD Initiated Contact with Patient 02/20/12 2055      Chief Complaint  Patient presents with  . Abscess    (Consider location/radiation/quality/duration/timing/severity/associated sxs/prior treatment) HPI Comments: The patient reports she has hidradenitis suppurativa and she has developed abscesses of her right and left breast and left thigh.  The left thigh abscess opened on its own but is not healing. Reports she has had two surgeries of her axilla, but continues to get abscesses.  The surgeries were performed in [place].  Patient states since the abscesses began she has had migraines, decreased appetite, nausea, increased sleep, bilateral ear pain, metallic taste in her mouth, chills, and sweats.  The patient has also recently had a spontaneous abortion.  On the fifth of this month she was diagnosed with a threatened miscarriage, and uncontrolled diabetes.  She had beta hCG Quant of 130 at the time.  [facility] and had a Quant of 91.  States she was asked to come back to have it rechecked, but did not have the transportation to do it.  Patient states she continues to have light bleeding but never enough for a pad.  States she just has blood when she wiped.  Patient denies abdominal pain. Denies any drainage of the abscesses, denies nipple discharge.    Patient is a 26 y.o. female presenting with abscess. The history is provided by the patient.  Abscess     Past Medical History  Diagnosis Date  . Hypertension   . Diabetes mellitus   . Asthma     Past Surgical History  Procedure Date  . Appendectomy   . Tonsillectomy   . Adenoidectomy     Family History  Problem Relation Age of Onset  . Hypertension Mother   . Hypertension Father     History  Substance Use Topics  . Smoking status: Current Everyday Smoker -- 1.0 packs/day for 10 years  . Smokeless tobacco: Not on file  .  Alcohol Use: No    OB History    Grav Para Term Preterm Abortions TAB SAB Ect Mult Living   1               Review of Systems  Respiratory: Negative for shortness of breath.   Cardiovascular: Negative for chest pain.  Gastrointestinal: Negative for abdominal pain.  All other systems reviewed and are negative.    Allergies  Peanut-containing drug products; Penicillins; and Cephalexin  Home Medications   Current Outpatient Rx  Name Route Sig Dispense Refill  . HYDROCODONE-ACETAMINOPHEN 5-325 MG PO TABS Oral Take 1 tablet by mouth every 4 (four) hours as needed for pain. 20 tablet 0  . SULFAMETHOXAZOLE-TRIMETHOPRIM 800-160 MG PO TABS Oral Take 2 tablets by mouth 2 (two) times daily. 28 tablet 0    BP 148/103  Pulse 8  Temp(Src) 98.2 F (36.8 C) (Oral)  Resp 18  SpO2 100%  LMP 01/21/2012  Physical Exam  Nursing note and vitals reviewed. Constitutional: She is oriented to person, place, and time. She appears well-developed and well-nourished. No distress.  HENT:  Head: Normocephalic and atraumatic.  Neck: Neck supple.  Cardiovascular: Normal rate, regular rhythm and normal heart sounds.   Pulmonary/Chest: Breath sounds normal. No respiratory distress. She has no wheezes. She has no rales. She exhibits no tenderness.  Abdominal: Soft. Bowel sounds are normal. She exhibits no distension and  no mass. There is no tenderness. There is no rebound and no guarding.  Neurological: She is alert and oriented to person, place, and time.  Skin:     Psychiatric: She has a normal mood and affect. Her behavior is normal. Judgment and thought content normal.    ED Course  Procedures (including critical care time)   Labs Reviewed  HCG, QUANTITATIVE, PREGNANCY  HCG, QUANTITATIVE, PREGNANCY  HCG Quant is 1  No results found.   1. Abscess of right breast   2. Abscess of left breast       MDM  Nontoxic, afebrile patient with cellulitis and possible abscess of right breast  and very small induration of left breast and left thigh.  That appeared to be early abscess of the left breast and healing abscess of the left thigh.  Patient is hypertensive but in pain.  No CP, SOB, HA, AMS.  Patient may have multiple abscesses of right breast vs complex abscess, associated cellulitis.  I have strongly recommended surgical follow up in 2-3 days.  Pt to return to ED for a recheck if she is unable to see surgery for any reason.  Pt d/c home with pain medication and antibiotics.  Patient verbalizes understanding and agrees with plan.          Dillard Cannon [place], [place] 02/21/12 [phone]

## 2012-02-22 ENCOUNTER — Encounter (INDEPENDENT_AMBULATORY_CARE_PROVIDER_SITE_OTHER): Payer: Self-pay | Admitting: General Surgery

## 2012-03-04 ENCOUNTER — Encounter (HOSPITAL_COMMUNITY): Payer: Self-pay | Admitting: Emergency Medicine

## 2012-03-04 ENCOUNTER — Emergency Department (HOSPITAL_COMMUNITY)
Admission: EM | Admit: 2012-03-04 | Discharge: 2012-03-04 | Disposition: A | Discharge status: Home / Self Care | Payer: Self-pay | Attending: Emergency Medicine | Admitting: Emergency Medicine

## 2012-03-04 DIAGNOSIS — E119 Type 2 diabetes mellitus without complications: Secondary | ICD-10-CM | POA: Insufficient documentation

## 2012-03-04 DIAGNOSIS — J029 Acute pharyngitis, unspecified: Secondary | ICD-10-CM | POA: Insufficient documentation

## 2012-03-04 DIAGNOSIS — J329 Chronic sinusitis, unspecified: Secondary | ICD-10-CM | POA: Insufficient documentation

## 2012-03-04 DIAGNOSIS — I10 Essential (primary) hypertension: Secondary | ICD-10-CM | POA: Insufficient documentation

## 2012-03-04 DIAGNOSIS — R51 Headache: Secondary | ICD-10-CM | POA: Insufficient documentation

## 2012-03-04 DIAGNOSIS — H9209 Otalgia, unspecified ear: Secondary | ICD-10-CM | POA: Insufficient documentation

## 2012-03-04 LAB — GLUCOSE, CAPILLARY: Glucose-Capillary: 233 mg/dL — ABNORMAL HIGH (ref 70–99)

## 2012-03-04 MED ORDER — METFORMIN HCL 1000 MG PO TABS: 1000.0000 mg | ORAL_TABLET | Freq: Two times a day (BID) | ORAL | Status: DC

## 2012-03-04 MED ORDER — HYDROCODONE-ACETAMINOPHEN 5-325 MG PO TABS: 1.0000 | ORAL_TABLET | ORAL | Status: AC | PRN

## 2012-03-04 MED ORDER — LEVOFLOXACIN 750 MG PO TABS: 750.0000 mg | ORAL_TABLET | Freq: Every day | ORAL | Status: AC

## 2012-03-04 NOTE — Discharge Instructions (Signed)
Sinusitis Sinuses are air pockets within the bones of your face. The growth of bacteria within a sinus leads to infection. The infection prevents the sinuses from draining. This infection is called sinusitis. SYMPTOMS  There will be different areas of pain depending on which sinuses have become infected.  The maxillary sinuses often produce pain beneath the eyes.   Frontal sinusitis may cause pain in the middle of the forehead and above the eyes.  Other problems (symptoms) include:  Toothaches.   Colored, pus-like (purulent) drainage from the nose.   Swelling, warmth, and tenderness over the sinus areas may be signs of infection.  TREATMENT  Sinusitis is most often determined by an exam.X-rays may be taken. If x-rays have been taken, make sure you obtain your results or find out how you are to obtain them. Your caregiver may give you medications (antibiotics). These are medications that will help kill the bacteria causing the infection. You may also be given a medication (decongestant) that helps to reduce sinus swelling.  HOME CARE INSTRUCTIONS   Only take over-the-counter or prescription medicines for pain, discomfort, or fever as directed by your caregiver.   Drink extra fluids. Fluids help thin the mucus so your sinuses can drain more easily.   Applying either moist heat or ice packs to the sinus areas may help relieve discomfort.   Use saline nasal sprays to help moisten your sinuses. The sprays can be found at your local drugstore.  SEEK IMMEDIATE MEDICAL CARE IF:  You have a fever.   You have increasing pain, severe headaches, or toothache.   You have nausea, vomiting, or drowsiness.   You develop unusual swelling around the face or trouble seeing.  MAKE SURE YOU:   Understand these instructions.   Will watch your condition.   Will get help right away if you are not doing well or get worse.  Document Released: 12/17/2005 Document Revised: 12/06/2011 Document Reviewed:  07/16/2007 [facility] Patient Information 2012 [place], [place].        Otalgia The most common reason for this in children is an infection of the middle ear. Pain from the middle ear is usually caused by a build-up of fluid and pressure behind the eardrum. Pain from an earache can be sharp, dull, or burning. The pain may be temporary or constant. The middle ear is connected to the nasal passages by a short narrow tube called the Eustachian tube. The Eustachian tube allows fluid to drain out of the middle ear, and helps keep the pressure in your ear equalized. CAUSES  A cold or allergy can block the Eustachian tube with inflammation and the build-up of secretions. This is especially likely in small children, because their Eustachian tube is shorter and more horizontal. When the Eustachian tube closes, the normal flow of fluid from the middle ear is stopped. Fluid can accumulate and cause stuffiness, pain, hearing loss, and an ear infection if germs start growing in this area. SYMPTOMS  The symptoms of an ear infection may include fever, ear pain, fussiness, increased crying, and irritability. Many children will have temporary and minor hearing loss during and right after an ear infection. Permanent hearing loss is rare, but the risk increases the more infections a child has. Other causes of ear pain include retained water in the outer ear canal from swimming and bathing. Ear pain in adults is less likely to be from an ear infection. Ear pain may be referred from other locations. Referred pain may be from the joint  between your jaw and the skull. It may also come from a tooth problem or problems in the neck. Other causes of ear pain include:  A foreign body in the ear.   Outer ear infection.   Sinus infections.   Impacted ear wax.   Ear injury.   Arthritis of the jaw or TMJ problems.   Middle ear infection.   Tooth infections.   Sore throat with pain to the ears.  DIAGNOSIS  Your  caregiver can usually make the diagnosis by examining you. Sometimes other special studies, including x-rays and lab work may be necessary. TREATMENT   If antibiotics were prescribed, use them as directed and finish them even if you or your child's symptoms seem to be improved.   Sometimes PE tubes are needed in children. These are little plastic tubes which are put into the eardrum during a simple surgical procedure. They allow fluid to drain easier and allow the pressure in the middle ear to equalize. This helps relieve the ear pain caused by pressure changes.  HOME CARE INSTRUCTIONS   Only take over-the-counter or prescription medicines for pain, discomfort, or fever as directed by your caregiver. DO NOT GIVE CHILDREN ASPIRIN because of the association of Reye's Syndrome in children taking aspirin.   Use a cold pack applied to the outer ear for 15 to 20 minutes, 3 to 4 times per day or as needed may reduce pain. Do not apply ice directly to the skin. You may cause frost bite.   Over-the-counter ear drops used as directed may be effective. Your caregiver may sometimes prescribe ear drops.   Resting in an upright position may help reduce pressure in the middle ear and relieve pain.   Ear pain caused by rapidly descending from high altitudes can be relieved by swallowing or chewing gum. Allowing infants to suck on a bottle during airplane travel can help.   Do not smoke in the house or near children. If you are unable to quit smoking, smoke outside.   Control allergies.  SEEK IMMEDIATE MEDICAL CARE IF:   You or your child are becoming sicker.   Pain or fever relief is not obtained with medicine.   You or your child's symptoms (pain, fever, or irritability) do not improve within 24 to 48 hours or as instructed.   Severe pain suddenly stops hurting. This may indicate a ruptured eardrum.   You or your children develop new problems such as severe headaches, stiff neck, difficulty  swallowing, or swelling of the face or around the ear.  Document Released: 08/03/2004 Document Revised: 12/06/2011 Document Reviewed: 12/08/2008 [facility] Patient Information 2012 [place], [place].         Diabetes, Type 2 Diabetes is a long-lasting (chronic) disease. In type 2 diabetes, the pancreas does not make enough insulin (a hormone), and the body does not respond normally to the insulin that is made. This type of diabetes was also previously called adult-onset diabetes. It usually occurs after the age of 20, but it can occur at any age.  CAUSES  Type 2 diabetes happens because the pancreasis not making enough insulin or your body has trouble using the insulin that your pancreas does make properly. SYMPTOMS   Drinking more than usual.   Urinating more than usual.   Blurred vision.   Dry, itchy skin.   Frequent infections.   Feeling more tired than usual (fatigue).  DIAGNOSIS The diagnosis of type 2 diabetes is usually made by one of the following tests:  Fasting blood glucose test. You will not eat for at least 8 hours and then take a blood test.   Random blood glucose test. Your blood glucose (sugar) is checked at any time of the day regardless of when you ate.   Oral glucose tolerance test (OGTT). Your blood glucose is measured after you have not eaten (fasted) and then after you drink a glucose containing beverage.  TREATMENT   Healthy eating.   Exercise.   Medicine, if needed.   Monitoring blood glucose.   Seeing your caregiver regularly.  HOME CARE INSTRUCTIONS   Check your blood glucose at least once a day. More frequent monitoring may be necessary, depending on your medicines and on how well your diabetes is controlled. Your caregiver will advise you.   Take your medicine as directed by your caregiver.   Do not smoke.   Make wise food choices. Ask your caregiver for information. Weight loss can improve your diabetes.   Learn about low blood  glucose (hypoglycemia) and how to treat it.   Get your eyes checked regularly.   Have a yearly physical exam. Have your blood pressure checked and your blood and urine tested.   Wear a pendant or bracelet saying that you have diabetes.   Check your feet every night for cuts, sores, blisters, and redness. Let your caregiver know if you have any problems.  SEEK MEDICAL CARE IF:   You have problems keeping your blood glucose in target range.   You have problems with your medicines.   You have symptoms of an illness that do not improve after 24 hours.   You have a sore or wound that is not healing.   You notice a change in vision or a new problem with your vision.   You have a fever.  MAKE SURE YOU:  Understand these instructions.   Will watch your condition.   Will get help right away if you are not doing well or get worse.  Document Released: 12/17/2005 Document Revised: 12/06/2011 Document Reviewed: 06/04/2011 [facility] Patient Information 2012 [place], [place].     If you have no primary doctor, here are some resources that may be helpful:  Medicaid-accepting [facility] Providers:   - [facility]- [address]      [phone]      Mon-Fri 9am-7pm, Sat 9am-1pm   - [facility]- [address] [place]      [phone]   - [facility]- [address] [place]      [phone]   [facility]- [address]      [phone]   - Renaye Rakers- [address] [place], [address]      [phone]      Only accepts [place] patients       after they have her name applied to their card   Self Pay (no insurance) in [place]:   - Sickle Cell Patients: Dr Willey Blade, [facility]      [address] [place]      [phone]   - Health [phone]   - Physician Referral Service- [phone]   - [facility]- [address]  [place]      [phone]   [facility] [place]- [address]   - [facility]- see information above      (  Speak to [facility] if you do not have insurance)   - [facility]- [address] [place]      [phone]   - [facility] [place]- [address]      [phone]   - [facility]- [address]      [phone]   - Dr Julio Sicks-  [address], [place]      [phone]   - [facility]- [address]      [phone]   - [facility]- [address]      [phone]      Also [address]      [phone]   - [facility]- [address]      [phone]      1st and 3rd Saturday every month, 10am-1pm Other agencies that provide inexpensive medical care:    [facility]  [phone]    [facility]  [phone]    [facility]  [phone]    [facility]  [phone]    [facility]  [phone]  General Information: Finding a doctor when you do not have health insurance can be tricky. Although you are not limited by an insurance plan, you are of course limited by her finances and how much but he can pay out of pocket.  What are your options if you don't have health insurance?   1) Find a Librarian, academic and Pay Out of Pocket Although you won't have to find out who is covered by your insurance plan, it is a good idea to ask around and get recommendations. You will then need to call the office and see if the doctor you have chosen will accept you as a new patient and what types of options they offer for patients who are self-pay. Some doctors offer discounts or will set up payment plans for their patients who do not have insurance, but you will need to ask so you aren't surprised when you get to your appointment.  2) Contact Your Local Health Department Not all health departments have doctors that can see patients for sick visits, but many do, so it is  worth a call to see if yours does. If you don't know where your local health department is, you can check in your phone book. The CDC also has a tool to help you locate your state's health department, and many state websites also have listings of all of their local health departments.  3) Find a Walk-in Clinic If your illness is not likely to be very severe or complicated, you may want to try a walk in clinic. These are popping up all over the country in pharmacies, drugstores, and shopping centers. They're usually staffed by nurse practitioners or physician assistants that have been trained to treat common illnesses and complaints. They're usually fairly quick and inexpensive. However, if you have serious medical issues or chronic medical problems, these are probably not your best option

## 2012-03-04 NOTE — ED Provider Notes (Signed)
History     CSN: [id number]  Arrival date & time 03/04/12  1152   First MD Initiated Contact with Patient 03/04/12 1441      Chief Complaint  Patient presents with  . Facial Swelling    (Consider location/radiation/quality/duration/timing/severity/associated sxs/prior treatment) The history is provided by the patient.   the patient is a 26 year old female with a history of diabetes, asthma, and hypertension for which she does not take her recommended medications who presents to the emergency department with the chief complaint of bilateral ear pain it is associated with facial swelling and neck pain. She reports her symptoms began a week ago with pain to both ears and have gradually progressed to radiate to the head and neck. The pain is sharp and shooting, severe, constant. She relates that in the morning, it is difficult to open her mouth but this seems to resolve as the day goes on. There is associated intermittent change in hearing, absent at time of examination. There's been no associated fever, chills, eye pain, ear drainage, nasal congestion, difficulty swallowing, change in voice, neck stiffness, chest pain, or increased shortness of breath from her baseline. Nothing makes the symptoms better or worse. No prior treatment. The patient relates a hx of mastoiditis several years ago that felt similar to this.  Past Medical History  Diagnosis Date  . Hypertension   . Diabetes mellitus   . Asthma     Past Surgical History  Procedure Date  . Appendectomy   . Tonsillectomy   . Adenoidectomy     Family History  Problem Relation Age of Onset  . Hypertension Mother   . Hypertension Father     History  Substance Use Topics  . Smoking status: Current Everyday Smoker -- 1.0 packs/day for 10 years  . Smokeless tobacco: Not on file  . Alcohol Use: No    OB History    Grav Para Term Preterm Abortions TAB SAB Ect Mult Living   1               Review of Systems 10 systems  reviewed and are negative for acute change except as noted in the HPI.  Allergies  Peanut-containing drug products; Penicillins; and Cephalexin  Home Medications  No current outpatient prescriptions on file.  BP 131/94  Pulse 76  Temp(Src) 98.8 F (37.1 C) (Oral)  Resp 22  SpO2 100%  LMP 01/21/2012  Physical Exam  Constitutional: She is oriented to person, place, and time. She appears well-developed.       Vital signs reviewed and although they were initially significant for hypertension and tachycardia, her blood pressure at the time of my evaluation is 131/94 her heart rate is 80. Patient intermittently tearful during examination. Morbidly obese  HENT:  Head: Normocephalic and atraumatic. No trismus in the jaw.  Right Ear: Hearing and external ear normal. Tympanic membrane is erythematous. A middle ear effusion is present.  Left Ear: Hearing and external ear normal. Tympanic membrane is erythematous.  Nose: Mucosal edema present. Right sinus exhibits maxillary sinus tenderness. Left sinus exhibits maxillary sinus tenderness.  Mouth/Throat: Mucous membranes are normal. Dental caries present. No dental abscesses or uvula swelling. Posterior oropharyngeal erythema present. No oropharyngeal exudate, posterior oropharyngeal edema or tonsillar abscesses.       Bilateral ear canals are mildly erythematous. Purulent mucus to bilateral nares.  Eyes: Conjunctivae are normal. Pupils are equal, round, and reactive to light.  Neck: Normal range of motion. Neck supple.  Soft tissue tenderness to the anterior neck without discrete lymphadenopathy  Cardiovascular: Normal rate, regular rhythm and normal heart sounds.   Pulmonary/Chest: Effort normal and breath sounds normal. No respiratory distress. She has no wheezes. She has no rales. She exhibits no tenderness.  Musculoskeletal: She exhibits no edema and no tenderness.  Neurological: She is alert and oriented to person, place, and time. No  cranial nerve deficit.       Normal spoken voice  Skin: Skin is warm and dry.    ED Course  Procedures (including critical care time)  Labs Reviewed  GLUCOSE, CAPILLARY - Abnormal; Notable for the following:    Glucose-Capillary 233 (*)    All other components within normal limits   No results found.   Dx 1: Sinusitis   MDM  Although pt presents with c/o facial and neck swelling, I cannot appreciate this on exam. She has also been evaluated by attending MD. Exam suggestive of sinusitis. She will be treated for this and given pain medication. I have advised f/u with Dr Pollyann Kennedy (who saw her 3 years ago) or the on-call ENT if Dr Pollyann Kennedy is unable to see her. Pt voices understanding of plan. Return precautions discussed. I will also restart her metformin and we have discussed at length the importance of a primary care doctor.         Shaaron Adler, PA-C 03/04/12 1600

## 2012-03-04 NOTE — ED Notes (Signed)
Pt states she had pain in her ears 1 week ago now she still has the pain but also has neck, lip  and jaw swelling.Denies fever.denies taking new medications. Denies rash,or increased SOB. 02 sat 100 RA. States tenderness when swallowing.

## 2012-03-04 NOTE — ED Provider Notes (Signed)
26 year old female comes in with complaints of bilateral ear pain for the last week. Pain is radiating into her throat and neck. Has intermittent difficulty hearing. She denies fever or chills. She thinks her face and neck are swollen. On exam, there is an altered light reflex on the right tympanic membrane suggesting a serous effusion. Nasal turbinates are edematous with purulent drainage and there is marked tenderness at the upper palpation over the maxillary sinuses. Oropharynx is clear the neck is supple without adenopathy. a round face but I do not see anything which is clearly swelling. She will be treated for sinusitis.   [name], MD 03/04/12 1550

## 2012-03-04 NOTE — ED Provider Notes (Signed)
Medical screening examination/treatment/procedure(s) were conducted as a shared visit with non-physician practitioner(s) and myself.  I personally evaluated the patient during the encounter   Vaishali Baise, MD 03/04/12 1635 

## 2012-03-04 NOTE — ED Notes (Signed)
Facial swelling ear pain throat pain  X 1 week

## 2012-12-31 HISTORY — PX: DILATION AND CURETTAGE OF UTERUS: SHX78

## 2013-03-05 ENCOUNTER — Encounter (HOSPITAL_COMMUNITY): Payer: Self-pay | Admitting: *Deleted

## 2013-03-05 ENCOUNTER — Emergency Department (HOSPITAL_COMMUNITY)
Admission: EM | Admit: 2013-03-05 | Discharge: 2013-03-05 | Disposition: A | Discharge status: Home / Self Care | Payer: Self-pay | Attending: Emergency Medicine | Admitting: Emergency Medicine

## 2013-03-05 DIAGNOSIS — N898 Other specified noninflammatory disorders of vagina: Secondary | ICD-10-CM | POA: Insufficient documentation

## 2013-03-05 DIAGNOSIS — Z79899 Other long term (current) drug therapy: Secondary | ICD-10-CM | POA: Insufficient documentation

## 2013-03-05 DIAGNOSIS — IMO0002 Reserved for concepts with insufficient information to code with codable children: Secondary | ICD-10-CM | POA: Insufficient documentation

## 2013-03-05 DIAGNOSIS — E119 Type 2 diabetes mellitus without complications: Secondary | ICD-10-CM | POA: Insufficient documentation

## 2013-03-05 DIAGNOSIS — L732 Hidradenitis suppurativa: Secondary | ICD-10-CM | POA: Insufficient documentation

## 2013-03-05 DIAGNOSIS — F17200 Nicotine dependence, unspecified, uncomplicated: Secondary | ICD-10-CM | POA: Insufficient documentation

## 2013-03-05 DIAGNOSIS — N760 Acute vaginitis: Secondary | ICD-10-CM | POA: Insufficient documentation

## 2013-03-05 DIAGNOSIS — J45909 Unspecified asthma, uncomplicated: Secondary | ICD-10-CM | POA: Insufficient documentation

## 2013-03-05 DIAGNOSIS — I10 Essential (primary) hypertension: Secondary | ICD-10-CM | POA: Insufficient documentation

## 2013-03-05 DIAGNOSIS — Z4802 Encounter for removal of sutures: Secondary | ICD-10-CM | POA: Insufficient documentation

## 2013-03-05 LAB — URINALYSIS, ROUTINE W REFLEX MICROSCOPIC
Glucose, UA: >1000 mg/dL — AB
Leukocytes, UA: NEGATIVE
Protein, ur: NEGATIVE mg/dL
Specific Gravity, Urine: 1.038 — ABNORMAL HIGH (ref 1.005–1.030)
pH: 5.5 (ref 5.0–8.0)

## 2013-03-05 LAB — WET PREP, GENITAL
Clue Cells Wet Prep HPF POC: NONE SEEN
Trich, Wet Prep: NONE SEEN
Yeast Wet Prep HPF POC: NONE SEEN

## 2013-03-05 LAB — URINE MICROSCOPIC-ADD ON

## 2013-03-05 LAB — PREGNANCY, URINE: Preg Test, Ur: NEGATIVE

## 2013-03-05 LAB — GLUCOSE, CAPILLARY: Glucose-Capillary: 214 mg/dL — ABNORMAL HIGH (ref 70–99)

## 2013-03-05 MED ORDER — OXYCODONE-ACETAMINOPHEN 5-325 MG PO TABS: 1.0000 | ORAL_TABLET | ORAL | Status: DC | PRN

## 2013-03-05 MED ORDER — SODIUM CHLORIDE 0.9 % IV BOLUS (SEPSIS)
1000.0000 mL | Freq: Once | INTRAVENOUS | Status: AC
  Administered 2013-03-05: 1000 mL via INTRAVENOUS

## 2013-03-05 MED ORDER — CLINDAMYCIN HCL 300 MG PO CAPS: 300.0000 mg | ORAL_CAPSULE | Freq: Four times a day (QID) | ORAL | Status: DC

## 2013-03-05 MED ORDER — CLINDAMYCIN PHOSPHATE 600 MG/50ML IV SOLN
600.0000 mg | Freq: Once | INTRAVENOUS | Status: AC
  Administered 2013-03-05: 600 mg via INTRAVENOUS
  Filled 2013-03-05: qty 50

## 2013-03-05 MED ORDER — MORPHINE SULFATE 4 MG/ML IJ SOLN
4.0000 mg | Freq: Once | INTRAMUSCULAR | Status: AC
  Administered 2013-03-05: 4 mg via INTRAVENOUS
  Filled 2013-03-05: qty 1

## 2013-03-05 MED ORDER — LIDOCAINE-EPINEPHRINE-TETRACAINE (LET) SOLUTION
3.0000 mL | Freq: Once | NASAL | Status: AC
  Administered 2013-03-05: 3 mL via TOPICAL
  Filled 2013-03-05: qty 3

## 2013-03-05 MED ORDER — HYDROCODONE-ACETAMINOPHEN 5-325 MG PO TABS
2.0000 | ORAL_TABLET | Freq: Once | ORAL | Status: AC
  Administered 2013-03-05: 2 via ORAL
  Filled 2013-03-05: qty 2

## 2013-03-05 MED ORDER — ONDANSETRON HCL 4 MG/2ML IJ SOLN
4.0000 mg | Freq: Once | INTRAMUSCULAR | Status: AC
  Administered 2013-03-05: 4 mg via INTRAVENOUS
  Filled 2013-03-05: qty 2

## 2013-03-05 NOTE — ED Notes (Addendum)
Pt reports having surgery Jan 29 for "sweat gland issues". Has not been to follow up appt due to recent bad weather. Reports "knots, rash" to surgical area. Also had a similar area that she popped this am with drainage. Also c/o vaginal irritation x1 week.

## 2013-03-05 NOTE — Progress Notes (Signed)
ED CM consulted by ED patient advocate about medication assistance for self pay pt She reports having a pcp EPIC updated Cm reviewed MATCH program She agreed to services and assistance for clindamycin Rx Pt is aware that the Percocet Rx can not be filled in [facility] program Recommended [facility] try [facility] for discount rate for Percocet Pt state state she would obtain medication CM discussed and provided written information for self pay pcps, importance of pcp for f/u care, www.needymeds.org, discounted pharmacies, and other [place] resources such as financial assistance, DSS and  health department Reviewed Health connect number to assist with finding self pay provider close to pt's residence. Reviewed resources for [facility], general medical clinics, [facility] out patient [facility]ies, housing, affordable care act/health reform (deadline 03/30/13) and other resources in [employer] Pt voiced understanding and appreciation of resources provided

## 2013-03-05 NOTE — Progress Notes (Signed)
pcp is Dr Allena Katz at [facility] [place]

## 2013-03-05 NOTE — ED Provider Notes (Signed)
  Medical screening examination/treatment/procedure(s) were performed by non-physician practitioner and as supervising physician I was immediately available for consultation/collaboration.   Gerhard Munch, MD 03/05/13 [phone]

## 2013-03-05 NOTE — ED Provider Notes (Signed)
History     CSN: [id number]  Arrival date & time 03/05/13  1133   First MD Initiated Contact with Patient 03/05/13 1217      Chief Complaint  Patient presents with  . Rash  . Vaginal Itching    (Consider location/radiation/quality/duration/timing/severity/associated sxs/prior treatment) Patient is a 27 y.o. female presenting with vaginal itching. The history is provided by the patient and medical records. No language interpreter was used.  Vaginal Itching Associated symptoms include a rash. Pertinent negatives include no abdominal pain, chest pain, coughing, diaphoresis, fatigue, fever, headaches, nausea or vomiting.    Keirstyn Aydt is a 27 y.o. female  with a hx of DM, hydradenitis, HTN, asthma presents to the Emergency Department complaining of gradual, persistent, progressively worsening rash and vaginal itching onset 2 weeks ago.  Vaginal itching began last week.  Pt has tried vagisil for the itching without relief.  Associated symptoms include vaginal itching, abscesses, vomiting x2 yesterday and x1 today.  Nothing makes it better and nothing makes it worse.  Pt denies fever, chills, headache, chest pain, shortness of breath, abdominal pain, nausea, diarrhea, weakness, dizziness, syncope.  Pt is here visiting her mother.  She had surgery in the bilateral axilla for hydradenitis on 01/28/13, but was unable to go back to have the stitches removed.     Past Medical History  Diagnosis Date  . Hypertension   . Diabetes mellitus   . Asthma     Past Surgical History  Procedure Laterality Date  . Appendectomy    . Tonsillectomy    . Adenoidectomy    . Other surgical history      sweat gland excision    Family History  Problem Relation Age of Onset  . Hypertension Mother   . Hypertension Father     History  Substance Use Topics  . Smoking status: Current Every Day Smoker -- 1.00 packs/day for 10 years  . Smokeless tobacco: Not on file  . Alcohol Use: No    OB History    Grav Para Term Preterm Abortions TAB SAB Ect Mult Living   1               Review of Systems  Constitutional: Negative for fever, diaphoresis, appetite change, fatigue and unexpected weight change.  HENT: Negative for mouth sores and neck stiffness.   Eyes: Negative for visual disturbance.  Respiratory: Negative for cough, chest tightness, shortness of breath and wheezing.   Cardiovascular: Negative for chest pain.  Gastrointestinal: Negative for nausea, vomiting, abdominal pain, diarrhea and constipation.  Endocrine: Negative for polydipsia, polyphagia and polyuria.  Genitourinary: Positive for vaginal discharge. Negative for dysuria, urgency, frequency and hematuria.  Musculoskeletal: Negative for back pain.  Skin: Positive for rash.  Allergic/Immunologic: Negative for immunocompromised state.  Neurological: Negative for syncope, light-headedness and headaches.  Hematological: Does not bruise/bleed easily.  Psychiatric/Behavioral: Negative for sleep disturbance. The patient is not nervous/anxious.   All other systems reviewed and are negative.    Allergies  Peanut-containing drug products; Penicillins; and Cephalexin  Home Medications   Current Outpatient Rx  Name  Route  Sig  Dispense  Refill  . metFORMIN (GLUCOPHAGE) 500 MG tablet   Oral   Take 500 mg by mouth 2 (two) times daily with a meal.         . clindamycin (CLEOCIN) 300 MG capsule   Oral   Take 1 capsule (300 mg total) by mouth 4 (four) times daily. X 7 days   28 capsule  0   . EXPIRED: metFORMIN (GLUCOPHAGE) 1000 MG tablet   Oral   Take 1 tablet (1,000 mg total) by mouth 2 (two) times daily.   60 tablet   0   . oxyCODONE-acetaminophen (PERCOCET) 5-325 MG per tablet   Oral   Take 1 tablet by mouth every 4 (four) hours as needed for pain.   20 tablet   0     BP 136/88  Pulse 87  Temp(Src) 98 F (36.7 C) (Oral)  Resp 20  SpO2 99%  LMP 03/04/2013  Breastfeeding? Unknown  Physical Exam   Nursing note and vitals reviewed. Constitutional: She appears well-developed and well-nourished. No distress.  HENT:  Head: Normocephalic and atraumatic.  Mouth/Throat: Oropharynx is clear and moist. No oropharyngeal exudate.  Eyes: Conjunctivae are normal. No scleral icterus.  Neck: Normal range of motion. Neck supple.  Cardiovascular: Normal rate, regular rhythm and intact distal pulses.   Pulmonary/Chest: Effort normal and breath sounds normal. No respiratory distress. She has no wheezes.  Abdominal: Soft. Bowel sounds are normal. She exhibits no mass. There is no tenderness. There is no rebound and no guarding. Hernia confirmed negative in the right inguinal area and confirmed negative in the left inguinal area.  Genitourinary: No labial fusion. There is no rash, tenderness, lesion or injury on the right labia. There is no rash, tenderness, lesion or injury on the left labia. Uterus is not deviated, not enlarged, not fixed and not tender. Cervix exhibits no motion tenderness, no discharge and no friability. Right adnexum displays no mass, no tenderness and no fullness. Left adnexum displays no mass, no tenderness and no fullness. There is bleeding around the vagina. No erythema or tenderness around the vagina. No foreign body around the vagina. No signs of injury around the vagina. Vaginal discharge (scant, white seen more on the labia than in the vault) found.  Excoriations noted to the labia   Musculoskeletal: Normal range of motion. She exhibits no edema.  Lymphadenopathy:       Right: No inguinal adenopathy present.       Left: No inguinal adenopathy present.  Neurological: She is alert.  Speech is clear and goal oriented Moves extremities without ataxia  Skin: Skin is warm and dry. Rash noted. Rash is pustular. She is not diaphoretic. There is erythema.  Pustules and abscesses noted in the bilateral axilla, extending down the trunk and arms bilaterally Sutures in place in the axilla  bilaterally   Psychiatric: She has a normal mood and affect.    ED Course  SUTURE REMOVAL Date/Time: 03/05/2013 1:59 PM Performed by: Dierdre Forth Authorized by: Dierdre Forth Consent: Verbal consent obtained. written consent obtained. Risks and benefits: risks, benefits and alternatives were discussed Consent given by: patient Patient understanding: patient states understanding of the procedure being performed Patient consent: the patient's understanding of the procedure matches consent given Procedure consent: procedure consent matches procedure scheduled Relevant documents: relevant documents present and verified Site marked: the operative site was marked Required items: required blood products, implants, devices, and special equipment available Patient identity confirmed: verbally with patient Body area: trunk Location details: right axilla Wound Appearance: red, tender, indurated, draining and purulent Sutures Removed: 7 Post-removal: dressing applied Patient tolerance: Patient tolerated the procedure well with no immediate complications.  SUTURE REMOVAL Date/Time: 03/05/2013 3:48 PM Performed by: Dierdre Forth Authorized by: Dierdre Forth Consent: Verbal consent obtained. Risks and benefits: risks, benefits and alternatives were discussed Consent given by: patient Patient understanding: patient states understanding of  the procedure being performed Patient consent: the patient's understanding of the procedure matches consent given Procedure consent: procedure consent matches procedure scheduled Relevant documents: relevant documents present and verified Site marked: the operative site was marked Required items: required blood products, implants, devices, and special equipment available Patient identity confirmed: verbally with patient and arm band Time out: Immediately prior to procedure a "time out" was called to verify the correct patient,  procedure, equipment, support staff and site/side marked as required. Body area: trunk Location details: left axilla Wound Appearance: red, tender, indurated, draining and purulent Sutures Removed: 7 Post-removal: dressing applied Patient tolerance: Patient tolerated the procedure well with no immediate complications. Comments: Sutures buried and covered with skin requiring the use of lidocaine 2% with epi, 3mL and #11 scalpel.    INCISION AND DRAINAGE Date/Time: 03/05/2013 3:50 PM Performed by: Dierdre Forth Authorized by: Dierdre Forth Consent: Verbal consent obtained. Risks and benefits: risks, benefits and alternatives were discussed Consent given by: patient Patient understanding: patient states understanding of the procedure being performed Patient consent: the patient's understanding of the procedure matches consent given Procedure consent: procedure consent matches procedure scheduled Relevant documents: relevant documents present and verified Site marked: the operative site was marked Required items: required blood products, implants, devices, and special equipment available Patient identity confirmed: verbally with patient Time out: Immediately prior to procedure a "time out" was called to verify the correct patient, procedure, equipment, support staff and site/side marked as required. Type: abscess Body area: trunk (L axilla) Anesthesia: local infiltration Local anesthetic: lidocaine 2% with epinephrine Anesthetic total: 2 ml Patient sedated: no Scalpel size: 11 Incision type: single straight Complexity: simple Drainage: purulent Drainage amount: scant Wound treatment: wound left open Patient tolerance: Patient tolerated the procedure well with no immediate complications. Comments: Abscess was adjacent to suture line   (including critical care time)  Labs Reviewed  WET PREP, GENITAL - Abnormal; Notable for the following:    WBC, Wet Prep HPF POC FEW  (*)    All other components within normal limits  URINALYSIS, ROUTINE W REFLEX MICROSCOPIC - Abnormal; Notable for the following:    Specific Gravity, Urine 1.038 (*)    Glucose, UA >1000 (*)    Hgb urine dipstick SMALL (*)    Ketones, ur TRACE (*)    All other components within normal limits  GLUCOSE, CAPILLARY - Abnormal; Notable for the following:    Glucose-Capillary 214 (*)    All other components within normal limits  GC/CHLAMYDIA PROBE AMP  URINE MICROSCOPIC-ADD ON  PREGNANCY, URINE   No results found.   1. Vaginitis and vulvovaginitis   2. HIDRADENITIS SUPPURATIVA   3. Morbid obesity   4. Visit for suture removal   5. Abscess       MDM  Lindon Romp with vaginal discharge and ingrown sutures with satellite pustules.  Pt GU exam largely unremarkable with only few WBCs on wet prep.  No evidence of yeast BV or trichomonas.  Pt likely with contact vaginitis.  Will recommend sitz baths.  Pt also with infections surrounding the surgical sites as her stitches have remained in place since Jan 28, 2013.  Incisions have become pustulant, indurated.  Pt sutures removed, wounds cleaned and IV abx given.  Will d/c home with PO abx.  Strict return precautions given and pt has an appointment with her surgeon on March 13.  I strongly recommended that she keep that appointment. Also discussed use of warm compresses for axilla and other sites of  infection.    1. Medications: clindamycin, usual home medications 2. Treatment: rest, drink plenty of fluids, take medications as prescribed 3. Follow Up: Please followup with your primary doctor for discussion of your diagnoses and further evaluation after today's visit; if you do not have a primary care doctor use the resource guide provided to find one; follow-up with your surgeon or return here as needed        Dierdre Forth, PA-C 03/05/13 1605

## 2013-03-06 LAB — GC/CHLAMYDIA PROBE AMP: CT Probe RNA: NEGATIVE

## 2013-04-10 ENCOUNTER — Emergency Department (HOSPITAL_COMMUNITY)
Admission: EM | Admit: 2013-04-10 | Discharge: 2013-04-10 | Disposition: A | Discharge status: Home / Self Care | Payer: Self-pay | Attending: Emergency Medicine | Admitting: Emergency Medicine

## 2013-04-10 DIAGNOSIS — Y9389 Activity, other specified: Secondary | ICD-10-CM | POA: Insufficient documentation

## 2013-04-10 DIAGNOSIS — E119 Type 2 diabetes mellitus without complications: Secondary | ICD-10-CM | POA: Insufficient documentation

## 2013-04-10 DIAGNOSIS — I10 Essential (primary) hypertension: Secondary | ICD-10-CM | POA: Insufficient documentation

## 2013-04-10 DIAGNOSIS — Y929 Unspecified place or not applicable: Secondary | ICD-10-CM | POA: Insufficient documentation

## 2013-04-10 DIAGNOSIS — IMO0001 Reserved for inherently not codable concepts without codable children: Secondary | ICD-10-CM | POA: Insufficient documentation

## 2013-04-10 DIAGNOSIS — J45909 Unspecified asthma, uncomplicated: Secondary | ICD-10-CM | POA: Insufficient documentation

## 2013-04-10 DIAGNOSIS — L732 Hidradenitis suppurativa: Secondary | ICD-10-CM | POA: Insufficient documentation

## 2013-04-10 DIAGNOSIS — F17200 Nicotine dependence, unspecified, uncomplicated: Secondary | ICD-10-CM | POA: Insufficient documentation

## 2013-04-10 DIAGNOSIS — Z79899 Other long term (current) drug therapy: Secondary | ICD-10-CM | POA: Insufficient documentation

## 2013-04-10 MED ORDER — SULFAMETHOXAZOLE-TRIMETHOPRIM 800-160 MG PO TABS: 1.0000 | ORAL_TABLET | Freq: Two times a day (BID) | ORAL | Status: DC

## 2013-04-10 MED ORDER — OXYCODONE-ACETAMINOPHEN 5-325 MG PO TABS: 1.0000 | ORAL_TABLET | ORAL | Status: DC | PRN

## 2013-04-10 MED ORDER — OXYCODONE-ACETAMINOPHEN 5-325 MG PO TABS
1.0000 | ORAL_TABLET | Freq: Once | ORAL | Status: AC
  Administered 2013-04-10: 1 via ORAL
  Filled 2013-04-10: qty 1

## 2013-04-10 MED ORDER — SULFAMETHOXAZOLE-TMP DS 800-160 MG PO TABS
1.0000 | ORAL_TABLET | Freq: Once | ORAL | Status: AC
  Administered 2013-04-10: 1 via ORAL
  Filled 2013-04-10: qty 1

## 2013-04-10 NOTE — ED Notes (Signed)
Pt c/o red raised area to R thigh. Pt states she saw a black spider on her leg and states the area on her leg is from the spider bite. Pt states she popped the area and pus came out of it. Pt states the area feels tight and she has pain radiating down her R leg. Pt also states she has a draining abscess to L axilla area. Pt states she had surgery in January to same abscess. Pt states the drainage started today. Pt ambulatory to exam room with steady gait. Pt arrives with companion.

## 2013-04-10 NOTE — ED Provider Notes (Signed)
Medical screening examination/treatment/procedure(s) were performed by non-physician practitioner and as supervising physician I was immediately available for consultation/collaboration.   Keyetta Hollingworth L Reshaun Briseno, MD 04/10/13 2241 

## 2013-04-10 NOTE — ED Provider Notes (Signed)
History    This chart was scribed for non-physician practitioner working with [name], MD by Frederik Pear, ED Scribe. This patient was seen in room WTR5/WTR5 and the patient's care was started at 2141.   CSN: [id number]  Arrival date & time 04/10/13  2008   First MD Initiated Contact with Patient 04/10/13 2141      Chief Complaint  Patient presents with  . Insect Bite  . Abscess    (Consider location/radiation/quality/duration/timing/severity/associated sxs/prior treatment) The history is provided by the patient and medical records. No language interpreter was used.    Michelle Osborn is a 27 y.o. female with a h/o of hidradenitis suppurativa who presents to the Emergency Department complaining of sudden onset, constant, gradually improving erythematous area to her right upper thigh with swelling and severe pain that radiates down to her toes that began yesterday morning after she noticed a dark spider on her leg. She states the area began to swell so she used an earring to drain the area. She reports that pus came out. She denies any nausea, emesis or fevers. She also complains of a draining abscess to the left axilla area. She states that she had surgery on the same location in Jan and was on numerous courses of antibiotics. She reports that she is allergic to Toradol, Kephlex, and penicillin. She states that she was able to complete a course of Bactrim and Septra without complications.  Surgeon is Dr. Carolyne Fiscal.  Past Medical History  Diagnosis Date  . Hypertension   . Diabetes mellitus   . Asthma     Past Surgical History  Procedure Laterality Date  . Appendectomy    . Tonsillectomy    . Adenoidectomy    . Other surgical history      sweat gland excision    Family History  Problem Relation Age of Onset  . Hypertension Mother   . Hypertension Father     History  Substance Use Topics  . Smoking status: Current Every Day Smoker -- 1.00 packs/day for 10 years  .  Smokeless tobacco: Not on file  . Alcohol Use: No    OB History   Grav Para Term Preterm Abortions TAB SAB Ect Mult Living   1               Review of Systems  Skin: Positive for wound (spider bite).  All other systems reviewed and are negative.    Allergies  Peanut-containing drug products; Penicillins; and Cephalexin  Home Medications   Current Outpatient Rx  Name  Route  Sig  Dispense  Refill  . metFORMIN (GLUCOPHAGE) 500 MG tablet   Oral   Take 500 mg by mouth 2 (two) times daily with a meal.         . oxyCODONE-acetaminophen (PERCOCET) 5-325 MG per tablet   Oral   Take 1 tablet by mouth every 4 (four) hours as needed for pain.   20 tablet   0   . oxyCODONE-acetaminophen (PERCOCET/ROXICET) 5-325 MG per tablet   Oral   Take 1 tablet by mouth every 4 (four) hours as needed for pain.   10 tablet   0   . sulfamethoxazole-trimethoprim (SEPTRA DS) 800-160 MG per tablet   Oral   Take 1 tablet by mouth every 12 (twelve) hours.   10 tablet   0     BP 128/88  Pulse 120  Temp(Src) 98.4 F (36.9 C) (Oral)  Resp 22  SpO2 98%  Physical Exam  Nursing note and vitals reviewed. Constitutional: She is oriented to person, place, and time. She appears well-developed and well-nourished. No distress.  HENT:  Head: Normocephalic and atraumatic.  Eyes: EOM are normal. Pupils are equal, round, and reactive to light.  Neck: Normal range of motion. Neck supple. No tracheal deviation present.  Cardiovascular: Normal rate.   Pulmonary/Chest: Effort normal. No respiratory distress.  Abdominal: Soft. She exhibits no distension.  Musculoskeletal: Normal range of motion. She exhibits no edema.       Arms:      Legs: Neurological: She is alert and oriented to person, place, and time.  Skin: Skin is warm and dry.  Psychiatric: She has a normal mood and affect. Her behavior is normal.    ED Course  Procedures (including critical care time)  DIAGNOSTIC STUDIES: Oxygen  Saturation is 98% on room air, normal by my interpretation.    COORDINATION OF CARE:  21:50- Discussed planned course of treatment with the patient, including a course of Septra DS, who is agreeable at this time.  Labs Reviewed - No data to display No results found.   1. Hidradenitis suppurativa of left axilla   2. Spider bite, initial encounter       MDM  Abscess to left axilla previously removed by Dr. Carolyne Fiscal [facility]) will refer her back to [facility]. Nothing to drain on abscess on right thigh, pt drained it at home, nothing to drain here in ED. Rx for abx and pain medication given in ED.  Pt has been advised of the symptoms that warrant their return to the ED. Patient has voiced understanding and has agreed to follow-up with the PCP or specialist.  I personally performed the services described in this documentation, which was scribed in my presence. The recorded information has been reviewed and is accurate.        Dorthula Matas, PA-C 04/10/13 2211

## 2013-04-10 NOTE — ED Notes (Signed)
Pt has a ride home.  

## 2013-05-01 ENCOUNTER — Emergency Department (HOSPITAL_COMMUNITY)
Admission: EM | Admit: 2013-05-01 | Discharge: 2013-05-01 | Disposition: A | Discharge status: Home / Self Care | Payer: Self-pay | Attending: Emergency Medicine | Admitting: Emergency Medicine

## 2013-05-01 ENCOUNTER — Encounter (HOSPITAL_COMMUNITY): Payer: Self-pay | Admitting: *Deleted

## 2013-05-01 DIAGNOSIS — F3289 Other specified depressive episodes: Secondary | ICD-10-CM | POA: Insufficient documentation

## 2013-05-01 DIAGNOSIS — R45851 Suicidal ideations: Secondary | ICD-10-CM | POA: Insufficient documentation

## 2013-05-01 DIAGNOSIS — F17200 Nicotine dependence, unspecified, uncomplicated: Secondary | ICD-10-CM | POA: Insufficient documentation

## 2013-05-01 DIAGNOSIS — Z79899 Other long term (current) drug therapy: Secondary | ICD-10-CM | POA: Insufficient documentation

## 2013-05-01 DIAGNOSIS — J45909 Unspecified asthma, uncomplicated: Secondary | ICD-10-CM | POA: Insufficient documentation

## 2013-05-01 DIAGNOSIS — Z880 Allergy status to penicillin: Secondary | ICD-10-CM | POA: Insufficient documentation

## 2013-05-01 DIAGNOSIS — M79609 Pain in unspecified limb: Secondary | ICD-10-CM | POA: Insufficient documentation

## 2013-05-01 DIAGNOSIS — IMO0002 Reserved for concepts with insufficient information to code with codable children: Secondary | ICD-10-CM | POA: Insufficient documentation

## 2013-05-01 DIAGNOSIS — L732 Hidradenitis suppurativa: Secondary | ICD-10-CM | POA: Insufficient documentation

## 2013-05-01 DIAGNOSIS — I10 Essential (primary) hypertension: Secondary | ICD-10-CM | POA: Insufficient documentation

## 2013-05-01 DIAGNOSIS — Z8669 Personal history of other diseases of the nervous system and sense organs: Secondary | ICD-10-CM | POA: Insufficient documentation

## 2013-05-01 DIAGNOSIS — Z9889 Other specified postprocedural states: Secondary | ICD-10-CM | POA: Insufficient documentation

## 2013-05-01 DIAGNOSIS — E119 Type 2 diabetes mellitus without complications: Secondary | ICD-10-CM | POA: Insufficient documentation

## 2013-05-01 DIAGNOSIS — F329 Major depressive disorder, single episode, unspecified: Secondary | ICD-10-CM | POA: Insufficient documentation

## 2013-05-01 HISTORY — DX: Polyneuropathy, unspecified: G62.9

## 2013-05-01 MED ORDER — DOXYCYCLINE HYCLATE 100 MG PO CAPS: 100.0000 mg | ORAL_CAPSULE | Freq: Two times a day (BID) | ORAL | Status: DC

## 2013-05-01 MED ORDER — OXYCODONE-ACETAMINOPHEN 5-325 MG PO TABS: 1.0000 | ORAL_TABLET | Freq: Four times a day (QID) | ORAL | Status: DC | PRN

## 2013-05-01 NOTE — ED Notes (Signed)
Act team did not arrive, pt requesting to leave before being seen

## 2013-05-01 NOTE — ED Provider Notes (Signed)
History     CSN: [id number]  Arrival date & time 05/01/13  1120   First MD Initiated Contact with Patient 05/01/13 1134      Chief Complaint  Patient presents with  . Post-op Problem    (Consider location/radiation/quality/duration/timing/severity/associated sxs/prior treatment) HPI Comments: Patients with history of hidradenitis since 2006, status post surgery in [place], [place] in 2007 -- presents with complaint of worsening bilateral axilla pain, drainage that is been worsening for the past week. She was on a course of Septra was prescribed in the middle of April that she completed. This helped a previous "spider bite" but did not help the hidradenitis. She denies fever, nausea, or vomiting. Patient has tried multiple other treatments including warm compresses however these have not helped.The onset of this condition was acute. The course is constant. Aggravating factors: palpation. Alleviating factors: none.   Pt also c/o depression, bipolar symptoms that are worsening with new life stressors over the past week. She is no longer on her chronic medications due to moving. She has new PCP appt at the end of May. She is having depressive thoughts, no suicidal plan, but wonders if suicide would be easier than she is going through now.    The history is provided by the patient.    Past Medical History  Diagnosis Date  . Hypertension   . Diabetes mellitus   . Asthma   . Neuropathy     Past Surgical History  Procedure Laterality Date  . Appendectomy    . Tonsillectomy    . Adenoidectomy    . Other surgical history      sweat gland excision    Family History  Problem Relation Age of Onset  . Hypertension Mother   . Hypertension Father     History  Substance Use Topics  . Smoking status: Current Every Day Smoker -- 1.00 packs/day for 10 years  . Smokeless tobacco: Not on file  . Alcohol Use: No    OB History   Grav Para Term Preterm Abortions TAB SAB Ect Mult Living    1               Review of Systems  Constitutional: Negative for fever.  HENT: Negative for sore throat and rhinorrhea.   Eyes: Negative for redness.  Respiratory: Negative for cough.   Cardiovascular: Negative for chest pain.  Gastrointestinal: Negative for nausea, vomiting, abdominal pain and diarrhea.  Genitourinary: Negative for dysuria.  Musculoskeletal: Negative for myalgias.  Skin: Negative for rash.       Positive for abscess.  Neurological: Negative for headaches.  Hematological: Negative for adenopathy.  Psychiatric/Behavioral: Positive for suicidal ideas.    Allergies  Peanut-containing drug products; Penicillins; and Cephalexin  Home Medications   Current Outpatient Rx  Name  Route  Sig  Dispense  Refill  . metFORMIN (GLUCOPHAGE) 500 MG tablet   Oral   Take 500 mg by mouth 2 (two) times daily with a meal.         . oxyCODONE-acetaminophen (PERCOCET/ROXICET) 5-325 MG per tablet   Oral   Take 1 tablet by mouth every 4 (four) hours as needed for pain.   10 tablet   0   . sulfamethoxazole-trimethoprim (SEPTRA DS) 800-160 MG per tablet   Oral   Take 1 tablet by mouth every 12 (twelve) hours.   10 tablet   0     BP 130/89  Pulse 106  Temp(Src) 98.8 F (37.1 C) (Oral)  Resp 20  SpO2 98%  LMP 03/31/2013  Physical Exam  Nursing note and vitals reviewed. Constitutional: She appears well-developed and well-nourished.  HENT:  Head: Normocephalic and atraumatic.  Eyes: Conjunctivae are normal.  Neck: Normal range of motion. Neck supple.  Pulmonary/Chest: No respiratory distress.  Neurological: She is alert.  Skin: Skin is warm and dry.  Bilateral axilla with small abscesses, mild drainage, tenderness to palpation, and scarring. No cellulitis noted.  Psychiatric: She exhibits a depressed mood. She expresses no suicidal plans and no homicidal plans.    ED Course  Procedures (including critical care time)  Labs Reviewed - No data to display No  results found.   1. HIDRADENITIS SUPPURATIVA     12:34 PM Patient seen and examined. No areas that appear would be helped with I&D at this time. Pain and infection is more diffuse.    Vital signs reviewed and are as follows: Filed Vitals:   05/01/13 1140  BP: 130/89  Pulse: 106  Temp: 98.8 F (37.1 C)  Resp: 20   Will give patient prolonged course of abx (per Up-to-date recommendations) and be referred to surgery for definitive care.   Will give pain medication. Patient counseled on use of narcotic pain medications. Counseled not to combine these medications with others containing tylenol. Urged not to drink alcohol, drive, or perform any other activities that requires focus while taking these medications. The patient verbalizes understanding and agrees with the plan.  1:11 PM Spoke with [place] who will see.   3:41 PM Nursing notes state that patient left prior to ACT team eval. I was not notified of patient departure. Patient voices depressive feelings but no active suicidal ideation or plan. Do not feel she is a danger to herself.   MDM  Patient currently hydradenitis with secondary infections. No systemic symptoms of illness or fever. No acute need for surgical management. Will give prolong course of antibiotics as well as surgical referral for definitive treatment. Patient appears well, nontoxic. There are no areas that I think would be helped with I&D today.      Renne Crigler, PA-C 05/01/13 1542

## 2013-05-01 NOTE — ED Notes (Signed)
Pt tearful, states she would like to speak with someone about personal problems. States she has hx of depression. PA notified

## 2013-05-01 NOTE — ED Notes (Signed)
Pt called surgeon's office, will have paperwork faxed to ED

## 2013-05-01 NOTE — ED Notes (Addendum)
Pt reports having surgery for hydradenitis on January 29th. Starting last night pt had drainage from underarms and now c/o severe pain to underarms.

## 2013-05-01 NOTE — ED Notes (Signed)
CBG 205 

## 2013-05-02 NOTE — ED Provider Notes (Signed)
Medical screening examination/treatment/procedure(s) were conducted as a shared visit with non-physician practitioner(s) and myself.  I personally evaluated the patient during the encounter   Emely Fahy, MD 05/02/13 1304 

## 2013-06-24 ENCOUNTER — Encounter (HOSPITAL_COMMUNITY): Payer: Self-pay | Admitting: Emergency Medicine

## 2013-06-24 ENCOUNTER — Emergency Department (HOSPITAL_COMMUNITY): Payer: Self-pay

## 2013-06-24 ENCOUNTER — Emergency Department (HOSPITAL_COMMUNITY)
Admission: EM | Admit: 2013-06-24 | Discharge: 2013-06-24 | Disposition: A | Discharge status: Home / Self Care | Payer: Self-pay | Attending: Emergency Medicine | Admitting: Emergency Medicine

## 2013-06-24 ENCOUNTER — Emergency Department (HOSPITAL_COMMUNITY)
Admission: EM | Admit: 2013-06-24 | Discharge: 2013-06-24 | Discharge status: Against Medical Advice | Payer: Self-pay | Attending: Emergency Medicine | Admitting: Emergency Medicine

## 2013-06-24 DIAGNOSIS — J45909 Unspecified asthma, uncomplicated: Secondary | ICD-10-CM | POA: Insufficient documentation

## 2013-06-24 DIAGNOSIS — I10 Essential (primary) hypertension: Secondary | ICD-10-CM | POA: Insufficient documentation

## 2013-06-24 DIAGNOSIS — M549 Dorsalgia, unspecified: Secondary | ICD-10-CM | POA: Insufficient documentation

## 2013-06-24 DIAGNOSIS — Z79899 Other long term (current) drug therapy: Secondary | ICD-10-CM | POA: Insufficient documentation

## 2013-06-24 DIAGNOSIS — R0602 Shortness of breath: Secondary | ICD-10-CM | POA: Insufficient documentation

## 2013-06-24 DIAGNOSIS — Z8669 Personal history of other diseases of the nervous system and sense organs: Secondary | ICD-10-CM | POA: Insufficient documentation

## 2013-06-24 DIAGNOSIS — R739 Hyperglycemia, unspecified: Secondary | ICD-10-CM

## 2013-06-24 DIAGNOSIS — Z880 Allergy status to penicillin: Secondary | ICD-10-CM | POA: Insufficient documentation

## 2013-06-24 DIAGNOSIS — Z3202 Encounter for pregnancy test, result negative: Secondary | ICD-10-CM | POA: Insufficient documentation

## 2013-06-24 DIAGNOSIS — J45901 Unspecified asthma with (acute) exacerbation: Secondary | ICD-10-CM | POA: Insufficient documentation

## 2013-06-24 DIAGNOSIS — R112 Nausea with vomiting, unspecified: Secondary | ICD-10-CM | POA: Insufficient documentation

## 2013-06-24 DIAGNOSIS — F17200 Nicotine dependence, unspecified, uncomplicated: Secondary | ICD-10-CM | POA: Insufficient documentation

## 2013-06-24 DIAGNOSIS — E119 Type 2 diabetes mellitus without complications: Secondary | ICD-10-CM | POA: Insufficient documentation

## 2013-06-24 DIAGNOSIS — E1169 Type 2 diabetes mellitus with other specified complication: Secondary | ICD-10-CM | POA: Insufficient documentation

## 2013-06-24 DIAGNOSIS — R091 Pleurisy: Secondary | ICD-10-CM | POA: Insufficient documentation

## 2013-06-24 DIAGNOSIS — R079 Chest pain, unspecified: Secondary | ICD-10-CM | POA: Insufficient documentation

## 2013-06-24 LAB — CBC
HCT: 38.3 % (ref 36.0–46.0)
Hemoglobin: 13.2 g/dL (ref 12.0–15.0)
MCH: 27 pg (ref 26.0–34.0)
MCHC: 34.5 g/dL (ref 30.0–36.0)
MCV: 78.3 fL (ref 78.0–100.0)
Platelets: 326 10*3/uL (ref 150–400)
RBC: 4.89 MIL/uL (ref 3.87–5.11)
RDW: 13.6 % (ref 11.5–15.5)
WBC: 7.1 10*3/uL (ref 4.0–10.5)

## 2013-06-24 LAB — BASIC METABOLIC PANEL
BUN: 6 mg/dL (ref 6–23)
CO2: 23 mEq/L (ref 19–32)
Calcium: 9.6 mg/dL (ref 8.4–10.5)
Chloride: 104 mEq/L (ref 96–112)
Creatinine, Ser: 0.59 mg/dL (ref 0.50–1.10)
GFR calc Af Amer: >90 mL/min (ref >90)
GFR calc non Af Amer: >90 mL/min (ref >90)
Glucose, Bld: 191 mg/dL — ABNORMAL HIGH (ref 70–99)
Potassium: 3.8 mEq/L (ref 3.5–5.1)
Sodium: 139 mEq/L (ref 135–145)

## 2013-06-24 LAB — GLUCOSE, CAPILLARY: Glucose-Capillary: 177 mg/dL — ABNORMAL HIGH (ref 70–99)

## 2013-06-24 LAB — POCT I-STAT TROPONIN I: Troponin i, poc: 0 ng/mL (ref 0.00–0.08)

## 2013-06-24 MED ORDER — IBUPROFEN 800 MG PO TABS
800.0000 mg | ORAL_TABLET | Freq: Once | ORAL | Status: AC
  Administered 2013-06-24: 800 mg via ORAL
  Filled 2013-06-24: qty 1

## 2013-06-24 MED ORDER — MORPHINE SULFATE 4 MG/ML IJ SOLN
4.0000 mg | Freq: Once | INTRAMUSCULAR | Status: AC
  Administered 2013-06-24: 4 mg via INTRAVENOUS
  Filled 2013-06-24: qty 1

## 2013-06-24 MED ORDER — HYDROCODONE-ACETAMINOPHEN 5-325 MG PO TABS: 1.0000 | ORAL_TABLET | ORAL | Status: DC | PRN

## 2013-06-24 MED ORDER — METFORMIN HCL 500 MG PO TABS: 500.0000 mg | ORAL_TABLET | Freq: Two times a day (BID) | ORAL | Status: DC

## 2013-06-24 MED ORDER — ASPIRIN 81 MG PO CHEW
324.0000 mg | CHEWABLE_TABLET | Freq: Once | ORAL | Status: AC
  Administered 2013-06-24: 324 mg via ORAL
  Filled 2013-06-24: qty 4

## 2013-06-24 NOTE — ED Notes (Signed)
[id number]> Expected date:<BR> Expected time:<BR> Means of arrival:<BR> Comments:<BR> 80 ?SBO

## 2013-06-24 NOTE — ED Provider Notes (Signed)
History    CSN: [id number] Arrival date & time 06/24/13  1702  First MD Initiated Contact with Patient 06/24/13 1909     Chief Complaint  Patient presents with  . Chest Pain  . Back Pain   (Consider location/radiation/quality/duration/timing/severity/associated sxs/prior Treatment) HPI Comments: Patient presents with a chief complaint of chest pain that has been present since yesterday.  She states that the chest pain is substernal and does not radiate.  She reports that the pain is worse with taking deep breaths.  She does report some SOB associated with the pain.  She has not taken anything for the pain prior to arrival.  She reports that she had one episode of vomiting earlier today, but denies nausea at this time.  She denies dizziness, lightheadedness, syncope, numbness, or tingling.  She denies cough, fever, chills, or hemoptysis.  She denies She went to the ED at [facility] earlier today and was seen in triage, but not seen by a provider.  In triage a CBC, BMP, troponin, and CXR were ordered, which were unremarkable.  She states that she is currently not on any estrogen containing medications.  She denies lower extremity edema or pain. Denies prolonged travel or surgeries in the past 4 weeks.  No prior history of PE or DVT.  No prior cardiac history.   The history is provided by the patient.   Past Medical History  Diagnosis Date  . Hypertension   . Diabetes mellitus   . Asthma   . Neuropathy    Past Surgical History  Procedure Laterality Date  . Appendectomy    . Tonsillectomy    . Adenoidectomy    . Other surgical history      sweat gland excision   Family History  Problem Relation Age of Onset  . Hypertension Mother   . Hypertension Father    History  Substance Use Topics  . Smoking status: Current Every Day Smoker -- 1.00 packs/day for 10 years  . Smokeless tobacco: Not on file  . Alcohol Use: No   OB History   Grav Para Term Preterm Abortions TAB SAB Ect Mult  Living   1              Review of Systems  Constitutional: Negative for fever and chills.  Respiratory: Positive for shortness of breath.   Cardiovascular: Positive for chest pain.  Gastrointestinal: Positive for nausea. Negative for abdominal pain and diarrhea.  Musculoskeletal: Positive for back pain.  All other systems reviewed and are negative.    Allergies  Darvocet; Peanut-containing drug products; Penicillins; Cephalexin; and Toradol  Home Medications  No current outpatient prescriptions on file. BP 118/79  Pulse 98  Temp(Src) 98.5 F (36.9 C) (Oral)  Resp 20  SpO2 100%  LMP 06/09/2013 Physical Exam  Nursing note and vitals reviewed. Constitutional: She appears well-developed and well-nourished. No distress.  HENT:  Head: Normocephalic and atraumatic.  Mouth/Throat: Oropharynx is clear and moist.  Neck: Normal range of motion. Neck supple.  Cardiovascular: Normal rate, regular rhythm, normal heart sounds and intact distal pulses.   Pulmonary/Chest: Effort normal and breath sounds normal. No respiratory distress. She has no decreased breath sounds. She has no wheezes. She has no rales. She exhibits tenderness.  Chest wall tenderness to palpation  Abdominal: Soft. Bowel sounds are normal. She exhibits no distension and no mass. There is no tenderness. There is no rebound and no guarding.  Musculoskeletal: Normal range of motion.  Neurological: She is alert.  Skin: Skin is warm and dry. She is not diaphoretic.  Psychiatric: She has a normal mood and affect.    ED Course  Procedures (including critical care time) Labs Reviewed  PREGNANCY, URINE   Dg Chest 2 View  06/24/2013   *RADIOLOGY REPORT*  Clinical Data: Chest pain and shortness of breath.  CHEST - 2 VIEW  Comparison: 05/19/2009  Findings: The lungs are clear without focal consolidation, edema, effusion or pneumothorax.  Cardiopericardial silhouette is within normal limits for size.  Imaged bony structures of  the thorax are intact.  IMPRESSION: Stable.  Normal exam.   Original Report Authenticated By: Kennith Center, M.D.   No diagnosis found.   Date: 06/24/2013  Rate: 109  Rhythm: sinus tachycardia  QRS Axis: normal  Intervals: normal  ST/T Wave abnormalities: normal  Conduction Disutrbances:none  Narrative Interpretation:   Old EKG Reviewed: none available    8:45 PM Patient signed out to [employer], PA-C at shift change.  D-dimer pending.  MDM  Patient presents with a chief complaint of pleuritic chest pain that has been present since yesterday.  Chest wall tender to palpation on exam.  Labs and CXR ordered earlier today unremarkable.  Heart rate initially 120.  Therefore, patient is not PERC negative.  D-dimer pending.    Pascal Lux [place], PA-C 06/24/13 2211

## 2013-06-24 NOTE — ED Notes (Signed)
Pt c/o CP and SOB starting last night; pt noted to be hyperventilating; pt sts has not had meds x months; pt sts no able to hold down PO

## 2013-06-24 NOTE — ED Notes (Signed)
Pt states that she began having mid back pain and chest pain last night.  Went to [facility] this morning.  Pt still has EKG stickers on chest and gauze from blood draws.  States that she left there and came here because this is her "hospital of choice".

## 2013-06-24 NOTE — ED Notes (Signed)
Pt sts she did not want to stay to be seen

## 2013-06-25 NOTE — ED Provider Notes (Signed)
Medical screening examination/treatment/procedure(s) were performed by non-physician practitioner and as supervising physician I was immediately available for consultation/collaboration.   Atley Scarboro L Tru Leopard, MD 06/25/13 1937 

## 2014-11-01 ENCOUNTER — Encounter (HOSPITAL_COMMUNITY): Payer: Self-pay | Admitting: Emergency Medicine

## 2015-07-04 ENCOUNTER — Encounter (HOSPITAL_COMMUNITY): Payer: Self-pay | Admitting: Emergency Medicine

## 2015-07-04 ENCOUNTER — Emergency Department (HOSPITAL_COMMUNITY)
Admission: EM | Admit: 2015-07-04 | Discharge: 2015-07-04 | Disposition: A | Discharge status: Home / Self Care | Payer: Medicaid - Out of State | Attending: Emergency Medicine | Admitting: Emergency Medicine

## 2015-07-04 ENCOUNTER — Emergency Department (HOSPITAL_COMMUNITY): Payer: Medicaid - Out of State

## 2015-07-04 DIAGNOSIS — W19XXXA Unspecified fall, initial encounter: Secondary | ICD-10-CM

## 2015-07-04 DIAGNOSIS — S40012A Contusion of left shoulder, initial encounter: Secondary | ICD-10-CM | POA: Diagnosis not present

## 2015-07-04 DIAGNOSIS — S0083XA Contusion of other part of head, initial encounter: Secondary | ICD-10-CM

## 2015-07-04 DIAGNOSIS — E119 Type 2 diabetes mellitus without complications: Secondary | ICD-10-CM | POA: Diagnosis not present

## 2015-07-04 DIAGNOSIS — J45909 Unspecified asthma, uncomplicated: Secondary | ICD-10-CM | POA: Diagnosis not present

## 2015-07-04 DIAGNOSIS — Z79899 Other long term (current) drug therapy: Secondary | ICD-10-CM | POA: Diagnosis not present

## 2015-07-04 DIAGNOSIS — Z720 Tobacco use: Secondary | ICD-10-CM | POA: Insufficient documentation

## 2015-07-04 DIAGNOSIS — Y998 Other external cause status: Secondary | ICD-10-CM | POA: Insufficient documentation

## 2015-07-04 DIAGNOSIS — W01198A Fall on same level from slipping, tripping and stumbling with subsequent striking against other object, initial encounter: Secondary | ICD-10-CM | POA: Insufficient documentation

## 2015-07-04 DIAGNOSIS — Z8669 Personal history of other diseases of the nervous system and sense organs: Secondary | ICD-10-CM | POA: Insufficient documentation

## 2015-07-04 DIAGNOSIS — Y92095 Swimming-pool of other non-institutional residence as the place of occurrence of the external cause: Secondary | ICD-10-CM | POA: Diagnosis not present

## 2015-07-04 DIAGNOSIS — I10 Essential (primary) hypertension: Secondary | ICD-10-CM | POA: Diagnosis not present

## 2015-07-04 DIAGNOSIS — Z880 Allergy status to penicillin: Secondary | ICD-10-CM | POA: Insufficient documentation

## 2015-07-04 DIAGNOSIS — Y9302 Activity, running: Secondary | ICD-10-CM | POA: Diagnosis not present

## 2015-07-04 DIAGNOSIS — S4992XA Unspecified injury of left shoulder and upper arm, initial encounter: Secondary | ICD-10-CM | POA: Diagnosis present

## 2015-07-04 MED ORDER — HYDROCODONE-ACETAMINOPHEN 5-325 MG PO TABS
1.0000 | ORAL_TABLET | Freq: Once | ORAL | Status: AC
  Administered 2015-07-04: 1 via ORAL
  Filled 2015-07-04: qty 1

## 2015-07-04 MED ORDER — CYCLOBENZAPRINE HCL 10 MG PO TABS
10.0000 mg | ORAL_TABLET | Freq: Once | ORAL | Status: AC
  Administered 2015-07-04: 10 mg via ORAL
  Filled 2015-07-04: qty 1

## 2015-07-04 MED ORDER — NAPROXEN 500 MG PO TABS: 500.0000 mg | ORAL_TABLET | Freq: Two times a day (BID) | ORAL | Status: DC

## 2015-07-04 MED ORDER — HYDROCODONE-ACETAMINOPHEN 5-325 MG PO TABS: 1.0000 | ORAL_TABLET | Freq: Once | ORAL | Status: DC

## 2015-07-04 MED ORDER — OXYCODONE-ACETAMINOPHEN 5-325 MG PO TABS: 1.0000 | ORAL_TABLET | Freq: Four times a day (QID) | ORAL | Status: DC | PRN

## 2015-07-04 MED ORDER — CYCLOBENZAPRINE HCL 10 MG PO TABS: 10.0000 mg | ORAL_TABLET | Freq: Two times a day (BID) | ORAL | Status: DC | PRN

## 2015-07-04 NOTE — ED Notes (Signed)
Pt verbalizes understanding of d/c instructions and denies any further needs at this time. 

## 2015-07-04 NOTE — ED Provider Notes (Signed)
CSN: [id number]     Arrival date & time 07/04/15  1536 History  This chart was scribed for non-physician practitioner Kerrie Buffalo, NP working with Mirian Mo, MD by Leone Payor, ED Scribe. This patient was seen in room TR03C/TR03C and the patient's care was started at 4:06 PM.    Chief Complaint  Patient presents with  . Fall   Patient is a 29 y.o. female presenting with fall. The history is provided by the patient. No language interpreter was used.  Fall This is a new problem. The current episode started 12 to 24 hours ago. The problem has not changed since onset.The symptoms are aggravated by bending and twisting. The symptoms are relieved by NSAIDs. The treatment provided mild relief.    HPI Comments: Michelle Osborn is a 29 y.o. female who presents to the Emergency Department complaining of a fall that occurred about 24 hours ago. She reports running to help her nephew who was in a swimming pool having difficulty when she slipped on wet cement, causing her to fall.  She reports landing on the left side of her body. She hit the left side of her face and has small bruise but most pain in her left shoulder. She denies LOC. She complains of constant, unchanged, moderate left shoulder pain which is worsened with any movement. She has taken ibuprofen 400 mg one just after it happened without significant relief. No medication today.  She denies wrist or elbow pain. The pain today is only in the left shoulder.   Past Medical History  Diagnosis Date  . Hypertension   . Diabetes mellitus   . Asthma   . Neuropathy    Past Surgical History  Procedure Laterality Date  . Appendectomy    . Tonsillectomy    . Adenoidectomy    . Other surgical history      sweat gland excision  . Dilation and curettage of uterus     Family History  Problem Relation Age of Onset  . Hypertension Mother   . Hypertension Father    History  Substance Use Topics  . Smoking status: Current Every Day Smoker -- 1.00  packs/day for 10 years  . Smokeless tobacco: Not on file  . Alcohol Use: No   OB History    Gravida Para Term Preterm AB TAB SAB Ectopic Multiple Living   1              Review of Systems  Musculoskeletal: Positive for myalgias and arthralgias (left shoulder). Negative for neck pain.  Neurological: Negative for weakness and numbness.  All other systems reviewed and are negative.  Allergies  Darvocet; Peanut-containing drug products; Penicillins; Cephalexin; and Toradol  Home Medications   Prior to Admission medications   Medication Sig Start Date End Date Taking? Authorizing Provider  cyclobenzaprine (FLEXERIL) 10 MG tablet Take 1 tablet (10 mg total) by mouth 2 (two) times daily as needed for muscle spasms. 07/04/15   Michelle Riddle Orlene Och, NP  HYDROcodone-acetaminophen (NORCO/VICODIN) 5-325 MG per tablet Take 1 tablet by mouth every 4 (four) hours as needed for pain. 06/24/13   Ruby Cola, PA-C  metFORMIN (GLUCOPHAGE) 500 MG tablet Take 1 tablet (500 mg total) by mouth 2 (two) times daily. 06/24/13   Ruby Cola, PA-C  naproxen (NAPROSYN) 500 MG tablet Take 1 tablet (500 mg total) by mouth 2 (two) times daily. 07/04/15   Michelle Stallings Orlene Och, NP  oxyCODONE-acetaminophen (ROXICET) 5-325 MG per tablet Take 1 tablet by mouth every 6 (  six) hours as needed for severe pain. 07/04/15   Michelle Sek Orlene Och, NP   BP 150/85 mmHg  Pulse 108  Temp(Src) 98.4 F (36.9 C) (Oral)  Resp 16  Ht 5\' 7"  (1.702 m)  Wt 237 lb (107.502 kg)  BMI 37.11 kg/m2  SpO2 97%  LMP 06/28/2015 Physical Exam  Constitutional: She is oriented to person, place, and time. She appears well-developed and well-nourished.  HENT:  Head: Normocephalic.  Right Ear: No hemotympanum.  Left Ear: No hemotympanum.  No hemotympanum bilaterally. No dental fractures. Small area of ecchymosis to the left cheek.  Eyes: Conjunctivae and EOM are normal. Pupils are equal, round, and reactive to light.  Neck: Normal range of motion. Neck  supple.  No tenderness over cervical spine  Cardiovascular: Normal rate, regular rhythm and intact distal pulses.   Radial pulse 2+ bilaterally  Pulmonary/Chest: Effort normal and breath sounds normal. She has no wheezes. She has no rales.  Abdominal: Soft. Bowel sounds are normal. She exhibits no distension.  Musculoskeletal: She exhibits tenderness.  LUE: Full ROM of wrist and elbow without pain. Pain to left shoulder with any ROM. tenderness to left sternocleidomastoid area. Decreased range of motion left shoulder due to pain.  Neurological: She is alert and oriented to person, place, and time.  Skin: Skin is warm and dry.  Psychiatric: She has a normal mood and affect. Her behavior is normal.  Nursing note and vitals reviewed.  ED Course  Procedures (including critical care time)  DIAGNOSTIC STUDIES: Oxygen Saturation is 97% on RA, adequate by my interpretation.    COORDINATION OF CARE: 4:17 PM Will order XRAY of left shoulder. Discussed treatment plan with pt at bedside and pt agreed to plan.   5:13 PM Pt reports only minimal improvement of symptoms after Flexeril and Norco.  Labs Review Labs Reviewed - No data to display  Imaging Review Dg Shoulder Left  07/04/2015   CLINICAL DATA:  Slipped on wet cement at a swimming pool. Falling on the left side. Pain and limited range of motion.  EXAM: LEFT SHOULDER - 2+ VIEW  COMPARISON:  None.  FINDINGS: There is no evidence of fracture or dislocation. There is no evidence of arthropathy or other focal bone abnormality. Soft tissues are unremarkable.  IMPRESSION: Negative.   Electronically Signed   By: Paulina Fusi M.D.   On: 07/04/2015 17:16    MDM  29 y.o. female with left shoulder pain s/p fall yesterday. Placed in sling with instructions on range of motion. Will treat for pain and she will follow up with ortho if symptoms persist. Stable for d/c without neurovascular compromise.   Final diagnoses:  Contusion of left shoulder,  initial encounter  Fall, initial encounter  Facial contusion, initial encounter    I personally performed the services described in this documentation, which was scribed in my presence. The recorded information has been reviewed and is accurate.    [address] [place], NP 07/04/15 1936  Mirian Mo, MD 07/04/15 [phone]

## 2015-07-04 NOTE — Discharge Instructions (Signed)
Follow up with Dr. Shon Baton if pain continues.

## 2015-07-04 NOTE — ED Notes (Signed)
Pt st's she was running yesterday and fell.  C/O pain in left shoulder, left arm and left upper  Back

## 2015-09-24 ENCOUNTER — Encounter (HOSPITAL_COMMUNITY): Payer: Self-pay | Admitting: Emergency Medicine

## 2015-09-24 ENCOUNTER — Emergency Department (HOSPITAL_COMMUNITY)
Admission: EM | Admit: 2015-09-24 | Discharge: 2015-09-24 | Disposition: A | Discharge status: Home / Self Care | Payer: Medicaid - Out of State | Attending: Emergency Medicine | Admitting: Emergency Medicine

## 2015-09-24 DIAGNOSIS — J45909 Unspecified asthma, uncomplicated: Secondary | ICD-10-CM | POA: Diagnosis not present

## 2015-09-24 DIAGNOSIS — A5901 Trichomonal vulvovaginitis: Secondary | ICD-10-CM | POA: Diagnosis not present

## 2015-09-24 DIAGNOSIS — R319 Hematuria, unspecified: Secondary | ICD-10-CM | POA: Diagnosis present

## 2015-09-24 DIAGNOSIS — Z79899 Other long term (current) drug therapy: Secondary | ICD-10-CM | POA: Insufficient documentation

## 2015-09-24 DIAGNOSIS — Z3202 Encounter for pregnancy test, result negative: Secondary | ICD-10-CM | POA: Insufficient documentation

## 2015-09-24 DIAGNOSIS — Z791 Long term (current) use of non-steroidal anti-inflammatories (NSAID): Secondary | ICD-10-CM | POA: Insufficient documentation

## 2015-09-24 DIAGNOSIS — Z880 Allergy status to penicillin: Secondary | ICD-10-CM | POA: Insufficient documentation

## 2015-09-24 DIAGNOSIS — E119 Type 2 diabetes mellitus without complications: Secondary | ICD-10-CM | POA: Insufficient documentation

## 2015-09-24 DIAGNOSIS — Z720 Tobacco use: Secondary | ICD-10-CM | POA: Insufficient documentation

## 2015-09-24 DIAGNOSIS — N939 Abnormal uterine and vaginal bleeding, unspecified: Secondary | ICD-10-CM | POA: Diagnosis not present

## 2015-09-24 DIAGNOSIS — I10 Essential (primary) hypertension: Secondary | ICD-10-CM | POA: Insufficient documentation

## 2015-09-24 LAB — URINALYSIS, ROUTINE W REFLEX MICROSCOPIC
Bilirubin Urine: NEGATIVE
Hgb urine dipstick: NEGATIVE
Ketones, ur: NEGATIVE mg/dL
LEUKOCYTES UA: NEGATIVE
NITRITE: NEGATIVE
PH: 5.5 (ref 5.0–8.0)
Protein, ur: NEGATIVE mg/dL
SPECIFIC GRAVITY, URINE: 1.025 (ref 1.005–1.030)
Urobilinogen, UA: 1 mg/dL (ref 0.0–1.0)

## 2015-09-24 LAB — WET PREP, GENITAL
CLUE CELLS WET PREP: NONE SEEN
WBC WET PREP: NONE SEEN
Yeast Wet Prep HPF POC: NONE SEEN

## 2015-09-24 LAB — BASIC METABOLIC PANEL
ANION GAP: 9 (ref 5–15)
BUN: 5 mg/dL — ABNORMAL LOW (ref 6–20)
CALCIUM: 9.5 mg/dL (ref 8.9–10.3)
CO2: 21 mmol/L — AB (ref 22–32)
CREATININE: 0.61 mg/dL (ref 0.44–1.00)
Chloride: 108 mmol/L (ref 101–111)
GLUCOSE: 279 mg/dL — AB (ref 65–99)
Potassium: 3.9 mmol/L (ref 3.5–5.1)
Sodium: 138 mmol/L (ref 135–145)

## 2015-09-24 LAB — CBC
HCT: 37.3 % (ref 36.0–46.0)
HEMOGLOBIN: 12.5 g/dL (ref 12.0–15.0)
MCH: 27 pg (ref 26.0–34.0)
MCHC: 33.5 g/dL (ref 30.0–36.0)
MCV: 80.6 fL (ref 78.0–100.0)
Platelets: 282 10*3/uL (ref 150–400)
RBC: 4.63 MIL/uL (ref 3.87–5.11)
RDW: 13.1 % (ref 11.5–15.5)
WBC: 8.7 10*3/uL (ref 4.0–10.5)

## 2015-09-24 LAB — URINE MICROSCOPIC-ADD ON

## 2015-09-24 LAB — PREGNANCY, URINE: PREG TEST UR: NEGATIVE

## 2015-09-24 LAB — CBG MONITORING, ED: GLUCOSE-CAPILLARY: 304 mg/dL — AB (ref 65–99)

## 2015-09-24 MED ORDER — METRONIDAZOLE 500 MG PO TABS: 500.0000 mg | ORAL_TABLET | Freq: Two times a day (BID) | ORAL | Status: DC

## 2015-09-24 MED ORDER — SODIUM CHLORIDE 0.9 % IV BOLUS (SEPSIS)
1000.0000 mL | Freq: Once | INTRAVENOUS | Status: AC
  Administered 2015-09-24: 1000 mL via INTRAVENOUS

## 2015-09-24 MED ORDER — OXYCODONE HCL 5 MG PO TABS
5.0000 mg | ORAL_TABLET | Freq: Once | ORAL | Status: AC
  Administered 2015-09-24: 5 mg via ORAL
  Filled 2015-09-24: qty 1

## 2015-09-24 NOTE — ED Provider Notes (Signed)
CSN: [id number]     Arrival date & time 09/24/15  1151 History   First MD Initiated Contact with Patient 09/24/15 1513     Chief Complaint  Patient presents with  . Abdominal Cramping  . Hematuria    Patient is a 29 y.o. female presenting with vaginal bleeding. The history is provided by the patient and medical records.  Vaginal Bleeding Quality:  Clots and dark red Severity:  Moderate Onset quality:  Gradual Duration:  3 months Timing:  Intermittent Progression:  Waxing and waning Chronicity:  New Menstrual history:  Irregular Context comment:  Started depo july 17, sx onset shortly after Ineffective treatments:  Ibuprofen, acetaminophen and rest (up to 1600mg  a day) Associated symptoms: abdominal pain (pelvic area, sharp, radiating from front to back), fatigue and nausea   Associated symptoms: no dysuria and no fever   Risk factors: no bleeding disorder     Past Medical History  Diagnosis Date  . Hypertension   . Diabetes mellitus   . Asthma   . Neuropathy    Past Surgical History  Procedure Laterality Date  . Appendectomy    . Tonsillectomy    . Adenoidectomy    . Other surgical history      sweat gland excision  . Dilation and curettage of uterus     Family History  Problem Relation Age of Onset  . Hypertension Mother   . Hypertension Father    Social History  Substance Use Topics  . Smoking status: Current Every Day Smoker -- 1.00 packs/day for 10 years  . Smokeless tobacco: None  . Alcohol Use: No   OB History    Gravida Para Term Preterm AB TAB SAB Ectopic Multiple Living   1              Review of Systems  Constitutional: Positive for fatigue. Negative for fever.  HENT: Negative for rhinorrhea.   Eyes: Negative for visual disturbance.  Respiratory: Negative for shortness of breath.   Cardiovascular: Negative for chest pain.  Gastrointestinal: Positive for nausea and abdominal pain (pelvic area, sharp, radiating from front to back). Negative for  vomiting.  Genitourinary: Positive for hematuria (pt unsure, states sees blood she thought was vaginal more with urination), vaginal bleeding, menstrual problem and pelvic pain. Negative for dysuria.  Skin: Negative for rash.  Allergic/Immunologic: Negative for immunocompromised state.  Neurological: Positive for weakness (generalized) and light-headedness (with exertion). Negative for syncope.  Psychiatric/Behavioral: Negative for confusion.    Allergies  Darvocet; Peanut-containing drug products; Penicillins; Cephalexin; and Toradol  Home Medications   Prior to Admission medications   Medication Sig Start Date End Date Taking? Authorizing Provider  cyclobenzaprine (FLEXERIL) 10 MG tablet Take 1 tablet (10 mg total) by mouth 2 (two) times daily as needed for muscle spasms. 07/04/15   Hope Orlene Och, NP  HYDROcodone-acetaminophen (NORCO/VICODIN) 5-325 MG per tablet Take 1 tablet by mouth every 4 (four) hours as needed for pain. 06/24/13   Ruby Cola, PA-C  metFORMIN (GLUCOPHAGE) 500 MG tablet Take 1 tablet (500 mg total) by mouth 2 (two) times daily. 06/24/13   Catherine Schinlever, PA-C  metroNIDAZOLE (FLAGYL) 500 MG tablet Take 1 tablet (500 mg total) by mouth 2 (two) times daily. 09/24/15   Urban Gibson, MD  naproxen (NAPROSYN) 500 MG tablet Take 1 tablet (500 mg total) by mouth 2 (two) times daily. 07/04/15   Hope Orlene Och, NP  oxyCODONE-acetaminophen (ROXICET) 5-325 MG per tablet Take 1 tablet by mouth every 6 (  six) hours as needed for severe pain. 07/04/15   Hope Orlene Och, NP   BP 143/80 mmHg  Pulse 98  Temp(Src) 98.4 F (36.9 C) (Oral)  Resp 18  Ht  (1.702 m)  Wt 230 lb (104.327 kg)  BMI 36.01 kg/m2  SpO2 100%  LMP 07/24/2015 Physical Exam  Constitutional: She is oriented to person, place, and time. She appears well-developed and well-nourished. No distress.  HENT:  Head: Normocephalic and atraumatic.  Eyes: Right eye exhibits no discharge. Left eye exhibits no discharge.   Pale conjunctiva  Neck: No tracheal deviation present.  Cardiovascular: Normal rate and regular rhythm.   Pulmonary/Chest: Effort normal and breath sounds normal. No respiratory distress.  Abdominal: Soft. She exhibits no distension. There is tenderness (suprapubic, no r/g).  Genitourinary: Vaginal discharge (scant dark vaginal blood, os closed, no CMT, uterus wnl, no adnexal fullness or tenderness) found.  Musculoskeletal: She exhibits no edema.  Neurological: She is alert and oriented to person, place, and time.  Skin: Skin is warm and dry. No pallor.  Psychiatric: She has a normal mood and affect. Her behavior is normal.    ED Course  Procedures (including critical care time) Labs Review Labs Reviewed  WET PREP, GENITAL - Abnormal; Notable for the following:    Trich, Wet Prep FEW (*)    All other components within normal limits  URINALYSIS, ROUTINE W REFLEX MICROSCOPIC (NOT AT [facility]) - Abnormal; Notable for the following:    Glucose, UA >1000 (*)    All other components within normal limits  BASIC METABOLIC PANEL - Abnormal; Notable for the following:    CO2 21 (*)    Glucose, Bld 279 (*)    BUN 5 (*)    All other components within normal limits  CBG MONITORING, ED - Abnormal; Notable for the following:    Glucose-Capillary 304 (*)    All other components within normal limits  URINE MICROSCOPIC-ADD ON  CBC  PREGNANCY, URINE  POC URINE PREG, ED  GC/CHLAMYDIA PROBE AMP ([place]) NOT AT [facility]    Imaging Review No results found. I have personally reviewed and evaluated these images and lab results as part of my medical decision-making.   EKG Interpretation None      MDM   Final diagnoses:  Trichomonal vaginitis  Vaginal bleeding    29 year old female with hx DM, HTN, asthma presenting with 3 months of vaginal bleeding and pelvic pain, as above. Afebrile, hemodynamically stable. Mildly tachycardic, improved after IVF. Pt reports she had a pelvic ultrasound in  July that was normal. Outside records reviewed- per discussion with OB/Gyn provider pt's amount of bleeding is within expected response to the Depo and she has an appt scheduled for the first week of October. Not pregnant currently.  Pelvic exam with trace vaginal blood, closed cervical os, and diffuse vaginal tenderness. No anemia on CBC. Elevated glucose without evidence of acidosis/DKA. No hematuria. Trichomoniasis on wet prep. Discussed results with patient. Plan to tx trich, f/u with OB/Gyn in Oct as scheduled, notified pt of need for further STI testing given results and our outstanding GC/CT tests. Dc in stable condition with return precautions.   Discussed with Dr. Denton Lank who oversaw management of this patient.     Urban Gibson, MD 09/24/15 1610  Cathren Laine, MD 09/24/15 2029

## 2015-09-24 NOTE — ED Notes (Signed)
Pt placed into gown and on to monitor upon arrival to room.Pt remains monitored by blood pressure and pulse ox.

## 2015-09-24 NOTE — ED Notes (Signed)
Pt. Stated, I've been having abdominal cramping ever since i started the Depo shot and bleeding either in vagina or where i pee. The cramping is breath taking.

## 2015-09-26 LAB — GC/CHLAMYDIA PROBE AMP (~~LOC~~) NOT AT ARMC
CHLAMYDIA, DNA PROBE: NEGATIVE
NEISSERIA GONORRHEA: NEGATIVE

## 2016-01-01 DIAGNOSIS — I219 Acute myocardial infarction, unspecified: Secondary | ICD-10-CM

## 2016-01-01 HISTORY — DX: Acute myocardial infarction, unspecified: I21.9

## 2016-03-31 HISTORY — PX: CORONARY ANGIOPLASTY WITH STENT PLACEMENT: SHX49

## 2016-03-31 HISTORY — PX: CARDIAC CATHETERIZATION: SHX172

## 2017-02-20 ENCOUNTER — Telehealth: Payer: Self-pay | Admitting: Internal Medicine

## 2017-02-20 NOTE — Telephone Encounter (Signed)
Called by Dr. Jacinto Reap at [facility] for possible transfer although we are holding SDU and Tele beds at this time due to [facility] hospital being full.  However before I could even speak with Dr. Jacinto Reap, was told by [facility] that we could "disregard" the transfer request at this time as [facility] was just now informed that we dont have SDU or tele beds available.

## 2017-05-22 ENCOUNTER — Encounter (HOSPITAL_COMMUNITY): Payer: Self-pay | Admitting: Emergency Medicine

## 2017-05-22 ENCOUNTER — Emergency Department (HOSPITAL_COMMUNITY): Payer: BLUE CROSS/BLUE SHIELD

## 2017-05-22 ENCOUNTER — Emergency Department (HOSPITAL_COMMUNITY)
Admission: EM | Admit: 2017-05-22 | Discharge: 2017-05-22 | Disposition: A | Discharge status: Home / Self Care | Payer: BLUE CROSS/BLUE SHIELD | Attending: Emergency Medicine | Admitting: Emergency Medicine

## 2017-05-22 DIAGNOSIS — I10 Essential (primary) hypertension: Secondary | ICD-10-CM | POA: Diagnosis not present

## 2017-05-22 DIAGNOSIS — Z7984 Long term (current) use of oral hypoglycemic drugs: Secondary | ICD-10-CM | POA: Insufficient documentation

## 2017-05-22 DIAGNOSIS — Z87891 Personal history of nicotine dependence: Secondary | ICD-10-CM | POA: Diagnosis not present

## 2017-05-22 DIAGNOSIS — J45909 Unspecified asthma, uncomplicated: Secondary | ICD-10-CM | POA: Insufficient documentation

## 2017-05-22 DIAGNOSIS — I252 Old myocardial infarction: Secondary | ICD-10-CM | POA: Insufficient documentation

## 2017-05-22 DIAGNOSIS — Z79899 Other long term (current) drug therapy: Secondary | ICD-10-CM | POA: Diagnosis not present

## 2017-05-22 DIAGNOSIS — Y999 Unspecified external cause status: Secondary | ICD-10-CM | POA: Diagnosis not present

## 2017-05-22 DIAGNOSIS — E1140 Type 2 diabetes mellitus with diabetic neuropathy, unspecified: Secondary | ICD-10-CM | POA: Insufficient documentation

## 2017-05-22 DIAGNOSIS — Y92410 Unspecified street and highway as the place of occurrence of the external cause: Secondary | ICD-10-CM | POA: Diagnosis not present

## 2017-05-22 DIAGNOSIS — Z91010 Allergy to peanuts: Secondary | ICD-10-CM | POA: Insufficient documentation

## 2017-05-22 DIAGNOSIS — Y939 Activity, unspecified: Secondary | ICD-10-CM | POA: Diagnosis not present

## 2017-05-22 DIAGNOSIS — O9A212 Injury, poisoning and certain other consequences of external causes complicating pregnancy, second trimester: Secondary | ICD-10-CM | POA: Insufficient documentation

## 2017-05-22 DIAGNOSIS — M5416 Radiculopathy, lumbar region: Secondary | ICD-10-CM

## 2017-05-22 DIAGNOSIS — Z3A16 16 weeks gestation of pregnancy: Secondary | ICD-10-CM | POA: Insufficient documentation

## 2017-05-22 DIAGNOSIS — M79604 Pain in right leg: Secondary | ICD-10-CM | POA: Insufficient documentation

## 2017-05-22 HISTORY — DX: Acute myocardial infarction, unspecified: I21.9

## 2017-05-22 MED ORDER — HYDROMORPHONE HCL 1 MG/ML IJ SOLN
1.0000 mg | Freq: Once | INTRAMUSCULAR | Status: AC
  Administered 2017-05-22: 1 mg via INTRAMUSCULAR
  Filled 2017-05-22: qty 1

## 2017-05-22 MED ORDER — ONDANSETRON 4 MG PO TBDP
4.0000 mg | ORAL_TABLET | Freq: Once | ORAL | Status: AC
  Administered 2017-05-22: 4 mg via ORAL
  Filled 2017-05-22: qty 1

## 2017-05-22 MED ORDER — MORPHINE SULFATE (PF) 4 MG/ML IV SOLN
4.0000 mg | Freq: Once | INTRAVENOUS | Status: AC
  Administered 2017-05-22: 4 mg via INTRAMUSCULAR
  Filled 2017-05-22: qty 1

## 2017-05-22 MED ORDER — HYDROMORPHONE HCL 1 MG/ML IJ SOLN
INTRAMUSCULAR | Status: AC
  Filled 2017-05-22: qty 1

## 2017-05-22 MED ORDER — OXYCODONE-ACETAMINOPHEN 5-325 MG PO TABS: 1.0000 | ORAL_TABLET | ORAL | 0 refills | Status: DC | PRN
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
## 2017-05-22 NOTE — ED Notes (Signed)
Patient able to ambulate independently with small steps and limp.
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
## 2017-05-22 NOTE — ED Notes (Signed)
Pt transported to xray 

## 2017-05-22 NOTE — ED Notes (Signed)
Patient attempted ambulation with RN.  Able to bear weight on to leg, but experiences shooting pain with ambulation.  MD made aware

## 2018-03-10 ENCOUNTER — Ambulatory Visit: Payer: Self-pay | Admitting: "Endocrinology

## 2018-05-21 ENCOUNTER — Ambulatory Visit: Payer: Self-pay | Admitting: "Endocrinology

## 2018-06-12 ENCOUNTER — Emergency Department
Admission: EM | Admit: 2018-06-12 | Discharge: 2018-06-13 | Disposition: A | Discharge status: Other Acute Inpatient Hospital | Payer: BLUE CROSS/BLUE SHIELD | Attending: Emergency Medicine | Admitting: Emergency Medicine

## 2018-06-12 ENCOUNTER — Emergency Department: Payer: BLUE CROSS/BLUE SHIELD

## 2018-06-12 ENCOUNTER — Other Ambulatory Visit: Payer: Self-pay

## 2018-06-12 ENCOUNTER — Encounter: Payer: Self-pay | Admitting: Emergency Medicine

## 2018-06-12 ENCOUNTER — Ambulatory Visit (HOSPITAL_COMMUNITY)
Admission: AD | Admit: 2018-06-12 | Discharge: 2018-06-12 | Disposition: A | Discharge status: Home / Self Care | Payer: BLUE CROSS/BLUE SHIELD | Source: Other Acute Inpatient Hospital | Attending: Emergency Medicine | Admitting: Emergency Medicine

## 2018-06-12 DIAGNOSIS — E7800 Pure hypercholesterolemia, unspecified: Secondary | ICD-10-CM | POA: Diagnosis not present

## 2018-06-12 DIAGNOSIS — J45909 Unspecified asthma, uncomplicated: Secondary | ICD-10-CM | POA: Insufficient documentation

## 2018-06-12 DIAGNOSIS — R079 Chest pain, unspecified: Secondary | ICD-10-CM | POA: Insufficient documentation

## 2018-06-12 DIAGNOSIS — E0590 Thyrotoxicosis, unspecified without thyrotoxic crisis or storm: Secondary | ICD-10-CM | POA: Insufficient documentation

## 2018-06-12 DIAGNOSIS — K219 Gastro-esophageal reflux disease without esophagitis: Secondary | ICD-10-CM | POA: Diagnosis not present

## 2018-06-12 DIAGNOSIS — R51 Headache: Secondary | ICD-10-CM | POA: Insufficient documentation

## 2018-06-12 DIAGNOSIS — Z87891 Personal history of nicotine dependence: Secondary | ICD-10-CM | POA: Insufficient documentation

## 2018-06-12 DIAGNOSIS — I10 Essential (primary) hypertension: Secondary | ICD-10-CM | POA: Diagnosis not present

## 2018-06-12 DIAGNOSIS — E119 Type 2 diabetes mellitus without complications: Secondary | ICD-10-CM | POA: Insufficient documentation

## 2018-06-12 DIAGNOSIS — Z91010 Allergy to peanuts: Secondary | ICD-10-CM | POA: Diagnosis not present

## 2018-06-12 DIAGNOSIS — I252 Old myocardial infarction: Secondary | ICD-10-CM | POA: Diagnosis not present

## 2018-06-12 LAB — CBC
HEMATOCRIT: 35.3 % (ref 35.0–47.0)
HEMOGLOBIN: 12 g/dL (ref 12.0–16.0)
MCH: 25.9 pg — ABNORMAL LOW (ref 26.0–34.0)
MCHC: 33.9 g/dL (ref 32.0–36.0)
MCV: 76.2 fL — ABNORMAL LOW (ref 80.0–100.0)
Platelets: 254 10*3/uL (ref 150–440)
RBC: 4.64 MIL/uL (ref 3.80–5.20)
RDW: 14.3 % (ref 11.5–14.5)
WBC: 6.5 10*3/uL (ref 3.6–11.0)

## 2018-06-12 LAB — BASIC METABOLIC PANEL
Anion gap: 8 (ref 5–15)
BUN: 11 mg/dL (ref 6–20)
CO2: 21 mmol/L — ABNORMAL LOW (ref 22–32)
Calcium: 9.3 mg/dL (ref 8.9–10.3)
Chloride: 110 mmol/L (ref 101–111)
Creatinine, Ser: 0.41 mg/dL — ABNORMAL LOW (ref 0.44–1.00)
GFR calc Af Amer: >60 mL/min (ref >60)
GLUCOSE: 145 mg/dL — AB (ref 65–99)
POTASSIUM: 3.5 mmol/L (ref 3.5–5.1)
SODIUM: 139 mmol/L (ref 135–145)

## 2018-06-12 LAB — TSH

## 2018-06-12 LAB — T4, FREE: Free T4: 4.28 ng/dL — ABNORMAL HIGH (ref 0.82–1.77)

## 2018-06-12 LAB — TROPONIN I

## 2018-06-12 MED ORDER — OXYCODONE-ACETAMINOPHEN 5-325 MG PO TABS
ORAL_TABLET | ORAL | Status: AC
  Administered 2018-06-12: 2 via ORAL
  Filled 2018-06-12: qty 2

## 2018-06-12 MED ORDER — MORPHINE SULFATE (PF) 4 MG/ML IV SOLN
4.0000 mg | Freq: Once | INTRAVENOUS | Status: AC
  Administered 2018-06-12: 4 mg via INTRAVENOUS
  Filled 2018-06-12: qty 1

## 2018-06-12 MED ORDER — SODIUM CHLORIDE 0.9 % IV BOLUS
1000.0000 mL | Freq: Once | INTRAVENOUS | Status: AC
  Administered 2018-06-12: 1000 mL via INTRAVENOUS

## 2018-06-12 MED ORDER — METHIMAZOLE 10 MG PO TABS
20.0000 mg | ORAL_TABLET | Freq: Once | ORAL | Status: AC
  Administered 2018-06-12: 20 mg via ORAL
  Filled 2018-06-12: qty 2

## 2018-06-12 MED ORDER — PROPRANOLOL HCL 20 MG/5ML PO SOLN
60.0000 mg | Freq: Once | ORAL | Status: DC
  Filled 2018-06-12: qty 15

## 2018-06-12 MED ORDER — ONDANSETRON HCL 4 MG/2ML IJ SOLN: 4.0000 mg | Freq: Once | INTRAMUSCULAR | Status: DC

## 2018-06-12 MED ORDER — ONDANSETRON HCL 4 MG/2ML IJ SOLN
4.0000 mg | Freq: Once | INTRAMUSCULAR | Status: AC
  Administered 2018-06-12: 4 mg via INTRAVENOUS
  Filled 2018-06-12: qty 2

## 2018-06-12 MED ORDER — OXYCODONE-ACETAMINOPHEN 5-325 MG PO TABS
2.0000 | ORAL_TABLET | Freq: Once | ORAL | Status: AC
  Administered 2018-06-12: 2 via ORAL

## 2018-06-12 NOTE — ED Notes (Signed)
Gave report to Barbara at [facility]  

## 2018-06-12 NOTE — ED Triage Notes (Signed)
PT to ED via POV with c/o CP and headache. Pt has thyroid disease and states her PCP told her to come to ED for rule thyroid storm. PT ambulatory. HR 130s

## 2018-06-12 NOTE — ED Provider Notes (Signed)
[facility] Emergency Department Provider Note  ___________________________________________   First MD Initiated Contact with Patient 06/12/18 1552     (approximate)  I have reviewed the triage vital signs and the nursing notes.   HISTORY  Chief Complaint Chest Pain and Headache   HPI Michelle Osborn is a 32 y.o. female history of diabetes, MI as well as hyperthyroidism on methimazole who is presenting to the emergency department with 3 days of intermittent altered level of consciousness as well as aching pain to the anterior neck rating up to her jaw.  Patient also has night sweats.  Is compliant with her medication.  Was sent into the emergency department today out of concern for hyperthyroidism/thyroid storm by her primary care doctor.  Past Medical History:  Diagnosis Date  . Asthma   . Diabetes mellitus   . Hypertension   . MI (myocardial infarction) (HCC)   . Neuropathy     Patient Active Problem List   Diagnosis Date Noted  . HYPERCHOLESTEROLEMIA 12/08/2009  . MORBID OBESITY 12/08/2009  . HIDRADENITIS SUPPURATIVA 12/08/2009  . VITAMIN D DEFICIENCY 11/18/2009  . GERD 11/18/2009  . BACTERIAL VAGINITIS 11/18/2009  . MENORRHAGIA 11/18/2009  . DIABETES MELLITUS, TYPE II 11/17/2009  . OBESITY 11/17/2009  . HYPERTENSION 11/17/2009  . ASTHMA 11/17/2009  . VAGINAL DISCHARGE 11/17/2009    Past Surgical History:  Procedure Laterality Date  . ADENOIDECTOMY    . APPENDECTOMY    . DILATION AND CURETTAGE OF UTERUS    . OTHER SURGICAL HISTORY     sweat gland excision  . TONSILLECTOMY      Prior to Admission medications   Not on File    Allergies Darvocet [propoxyphene n-acetaminophen]; Peanut-containing drug products; Penicillins; Cephalexin; Tramadol; and Toradol [ketorolac tromethamine]  Family History  Problem Relation Age of Onset  . Hypertension Mother   . Hypertension Father     Social History Social History   Tobacco Use  .  Smoking status: Former Smoker    Packs/day: 1.00    Years: 10.00    Pack years: 10.00  . Smokeless tobacco: Never Used  Substance Use Topics  . Alcohol use: No  . Drug use: Yes    Types: Marijuana    Review of Systems  Constitutional: No fever/chills Eyes: No visual changes. ENT: No sore throat. Cardiovascular: Anterior chest pain which patient describes as aching and radiating from her neck. Respiratory: Denies shortness of breath. Gastrointestinal: No abdominal pain.  No nausea, no vomiting.  No diarrhea.  No constipation. Genitourinary: Negative for dysuria. Musculoskeletal: Negative for back pain. Skin: Negative for rash. Neurological: Negative for headaches, focal weakness or numbness.   ____________________________________________   PHYSICAL EXAM:  VITAL SIGNS: ED Triage Vitals  Enc Vitals Group     BP 06/12/18 1539 (!) 172/105     Pulse Rate 06/12/18 1539 (!) 130     Resp 06/12/18 1539 18     Temp 06/12/18 1539 98.7 F (37.1 C)     Temp Source 06/12/18 1539 Oral     SpO2 06/12/18 1539 100 %     Weight 06/12/18 1540 168 lb (76.2 kg)     Height 06/12/18 1540 5\' 7"  (1.702 m)     Head Circumference --      Peak Flow --      Pain Score 06/12/18 1540 9     Pain Loc --      Pain Edu? --      Excl. in GC? --  Constitutional: Alert and oriented. Well appearing and in no acute distress. Eyes: Conjunctivae are normal.  Head: Atraumatic. Nose: No congestion/rhinnorhea. Mouth/Throat: Mucous membranes are moist.  Neck: No stridor.  Anterior fullness to the neck at the base of the neck at the thyroid.  Minimal tenderness to palpation and minimal warmth.  No fluctuance. Cardiovascular: Tachycardic, regular rhythm. Grossly normal heart sounds.   Respiratory: Normal respiratory effort.  No retractions. Lungs CTAB. Gastrointestinal: Soft and nontender. No distention.  Musculoskeletal: No lower extremity tenderness nor edema.  No joint effusions. Neurologic:  Normal  speech and language. No gross focal neurologic deficits are appreciated. Skin:  Skin is warm, dry and intact. No rash noted. Psychiatric: Mood and affect are normal. Speech and behavior are normal.  ____________________________________________   LABS (all labs ordered are listed, but only abnormal results are displayed)  Labs Reviewed  BASIC METABOLIC PANEL - Abnormal; Notable for the following components:      Result Value   CO2 21 (*)    Glucose, Bld 145 (*)    Creatinine, Ser 0.41 (*)    All other components within normal limits  CBC - Abnormal; Notable for the following components:   MCV 76.2 (*)    MCH 25.9 (*)    All other components within normal limits  TSH - Abnormal; Notable for the following components:   TSH <0.010 (*)    All other components within normal limits  T4, FREE - Abnormal; Notable for the following components:   Free T4 4.28 (*)    All other components within normal limits  TROPONIN I  POC URINE PREG, ED   ____________________________________________  EKG  ED ECG REPORT I, Arelia Longest, the attending physician, personally viewed and interpreted this ECG.   Date: 06/12/2018  EKG Time: 1549  Rate: 120  Rhythm: sinus tachycardia  Axis: Normal  Intervals:none  ST&T Change: No ST segment elevation or depression.  No abnormal T wave inversion.  ____________________________________________  RADIOLOGY  Minimal deviation of the upper thoracic trachea toward the right.  Question thyroid enlargement. ____________________________________________   PROCEDURES  Procedure(s) performed:   Procedures  Critical Care performed:   ____________________________________________   INITIAL IMPRESSION / ASSESSMENT AND PLAN / ED COURSE  Pertinent labs & imaging results that were available during my care of the patient were reviewed by me and considered in my medical decision making (see chart for details).  Differential diagnosis includes, but is  not limited to, ACS, aortic dissection, pulmonary embolism, cardiac tamponade, pneumothorax, pneumonia, pericarditis, myocarditis, GI-related causes including esophagitis/gastritis, and musculoskeletal chest wall pain.   Thyroid storm, hypothyroidism As part of my medical decision making, I reviewed the following data within the electronic MEDICAL RECORD NUMBER Notes from prior outpatient visits  Patient's heart rate down with IV fluids.  Undetectable TSH and elevated free T4.  ----------------------------------------- 10:17 PM on 06/12/2018 -----------------------------------------  No bed availability at [facility].  No endocrine consultation at [facility].  Accepted at [facility] by Dr. Leda Roys of the endocrine service.  Patient will be transferred ER to ER.  Accepting ER physician is Dr. Greer Pickerel.   ____________________________________________   FINAL CLINICAL IMPRESSION(S) / ED DIAGNOSES  Hyperthyroidism  NEW MEDICATIONS STARTED DURING THIS VISIT:  New Prescriptions   No medications on file     Note:  This document was prepared using Dragon voice recognition software and may include unintentional dictation errors.     Myrna Blazer, MD 06/12/18 2221

## 2018-07-01 ENCOUNTER — Encounter: Payer: Self-pay | Admitting: Emergency Medicine

## 2018-07-01 ENCOUNTER — Emergency Department
Admission: EM | Admit: 2018-07-01 | Discharge: 2018-07-01 | Disposition: A | Discharge status: Home / Self Care | Payer: BLUE CROSS/BLUE SHIELD | Attending: Emergency Medicine | Admitting: Emergency Medicine

## 2018-07-01 DIAGNOSIS — Z87891 Personal history of nicotine dependence: Secondary | ICD-10-CM | POA: Insufficient documentation

## 2018-07-01 DIAGNOSIS — Z7982 Long term (current) use of aspirin: Secondary | ICD-10-CM | POA: Insufficient documentation

## 2018-07-01 DIAGNOSIS — K0889 Other specified disorders of teeth and supporting structures: Secondary | ICD-10-CM

## 2018-07-01 DIAGNOSIS — Z91010 Allergy to peanuts: Secondary | ICD-10-CM | POA: Insufficient documentation

## 2018-07-01 DIAGNOSIS — K0510 Chronic gingivitis, plaque induced: Secondary | ICD-10-CM | POA: Insufficient documentation

## 2018-07-01 DIAGNOSIS — I252 Old myocardial infarction: Secondary | ICD-10-CM | POA: Insufficient documentation

## 2018-07-01 DIAGNOSIS — F1210 Cannabis abuse, uncomplicated: Secondary | ICD-10-CM | POA: Insufficient documentation

## 2018-07-01 DIAGNOSIS — E1140 Type 2 diabetes mellitus with diabetic neuropathy, unspecified: Secondary | ICD-10-CM | POA: Diagnosis not present

## 2018-07-01 DIAGNOSIS — I10 Essential (primary) hypertension: Secondary | ICD-10-CM | POA: Diagnosis not present

## 2018-07-01 DIAGNOSIS — Z79899 Other long term (current) drug therapy: Secondary | ICD-10-CM | POA: Diagnosis not present

## 2018-07-01 DIAGNOSIS — J45909 Unspecified asthma, uncomplicated: Secondary | ICD-10-CM | POA: Insufficient documentation

## 2018-07-01 MED ORDER — DOXYCYCLINE HYCLATE 100 MG PO TABS
100.0000 mg | ORAL_TABLET | Freq: Once | ORAL | Status: AC
  Administered 2018-07-01: 100 mg via ORAL
  Filled 2018-07-01: qty 1

## 2018-07-01 MED ORDER — HYDROCODONE-ACETAMINOPHEN 5-325 MG PO TABS
1.0000 | ORAL_TABLET | Freq: Once | ORAL | Status: AC
  Administered 2018-07-01: 1 via ORAL
  Filled 2018-07-01: qty 1

## 2018-07-01 MED ORDER — CHLORHEXIDINE GLUCONATE 0.12 % MT SOLN: 10.0000 mL | Freq: Two times a day (BID) | OROMUCOSAL | 0 refills | Status: DC

## 2018-07-01 MED ORDER — DOXYCYCLINE HYCLATE 100 MG PO TABS: 100.0000 mg | ORAL_TABLET | Freq: Two times a day (BID) | ORAL | 0 refills | Status: DC

## 2018-07-01 MED ORDER — MAGIC MOUTHWASH W/LIDOCAINE: 5.0000 mL | Freq: Four times a day (QID) | ORAL | 0 refills | Status: DC

## 2018-07-01 NOTE — ED Provider Notes (Signed)
[facility] Emergency Department Provider Note  ____________________________________________  Time seen: Approximately 10:59 PM  I have reviewed the triage vital signs and the nursing notes.   HISTORY  Chief Complaint Dental Pain    HPI Michelle Osborn is a 32 y.o. female who presents the emergency department complaining of right lower dental pain.  Patient reports that she has had intermittent pain with her dentition for several months but this typically responds to Tylenol and Motrin.  Patient reports that she has had sharp pain and burning sensation to the right lower dentition.  Patient has been taking Tylenol and Motrin and states that this does not relieve her symptoms any longer.  She denies any fevers or chills, difficulty breathing or swallowing.  Patient denies any dental trauma.  No other complaints at this time.  Patient denies any headache, visual changes, nasal congestion, sore throat, cough, abdominal pain, nausea vomiting, diarrhea or constipation.    Past Medical History:  Diagnosis Date  . Asthma   . Diabetes mellitus   . Hypertension   . MI (myocardial infarction) (HCC)   . Neuropathy     Patient Active Problem List   Diagnosis Date Noted  . HYPERCHOLESTEROLEMIA 12/08/2009  . MORBID OBESITY 12/08/2009  . HIDRADENITIS SUPPURATIVA 12/08/2009  . VITAMIN D DEFICIENCY 11/18/2009  . GERD 11/18/2009  . BACTERIAL VAGINITIS 11/18/2009  . MENORRHAGIA 11/18/2009  . DIABETES MELLITUS, TYPE II 11/17/2009  . OBESITY 11/17/2009  . HYPERTENSION 11/17/2009  . ASTHMA 11/17/2009  . VAGINAL DISCHARGE 11/17/2009    Past Surgical History:  Procedure Laterality Date  . ADENOIDECTOMY    . APPENDECTOMY    . DILATION AND CURETTAGE OF UTERUS    . OTHER SURGICAL HISTORY     sweat gland excision  . TONSILLECTOMY      Prior to Admission medications   Medication Sig Start Date End Date Taking? Authorizing Provider  aspirin 81 MG chewable tablet Chew 81  mg by mouth daily.    [provider]  carvedilol (COREG) 25 MG tablet Take 25 mg by mouth 2 (two) times daily with a meal.    [provider]  cetirizine (ZYRTEC) 10 MG tablet Take 10 mg by mouth daily.    [provider]  chlorhexidine (PERIDEX) 0.12 % solution Use as directed 10 mLs in the mouth or throat 2 (two) times daily. Swish and spit 07/01/18   Rylend Pietrzak, Delorise Royals, PA-C  doxycycline (VIBRA-TABS) 100 MG tablet Take 1 tablet (100 mg total) by mouth 2 (two) times daily. 07/01/18   Morna Flud, Delorise Royals, PA-C  ferrous sulfate 325 (65 FE) MG tablet Take 325 mg by mouth 2 (two) times daily.    [provider]  magic mouthwash w/lidocaine SOLN Take 5 mLs by mouth 4 (four) times daily. 07/01/18   Cinsere Mizrahi, Delorise Royals, PA-C  methimazole (TAPAZOLE) 10 MG tablet Take 10 mg by mouth 3 (three) times daily.    [provider]  pyridOXINE (VITAMIN B-6) 50 MG tablet Take 25 mg by mouth 3 (three) times daily.    [provider]  triamcinolone cream (KENALOG) 0.1 % Apply 1 application topically 2 (two) times daily.    [provider]    Allergies Darvocet [propoxyphene n-acetaminophen]; Peanut-containing drug products; Penicillins; Cephalexin; Tramadol; and Toradol [ketorolac tromethamine]  Family History  Problem Relation Age of Onset  . Hypertension Mother   . Hypertension Father     Social History Social History   Tobacco Use  . Smoking status:  Former Smoker    Packs/day: 1.00    Years: 10.00    Pack years: 10.00  . Smokeless tobacco: Never Used  Substance Use Topics  . Alcohol use: No  . Drug use: Yes    Types: Marijuana     Review of Systems  Constitutional: No fever/chills Eyes: No visual changes. No discharge ENT: Positive for right lower dental pain Cardiovascular: no chest pain. Respiratory: no cough. No SOB. Gastrointestinal: No abdominal pain.  No nausea, no vomiting.  No diarrhea.  No  constipation. Musculoskeletal: Negative for musculoskeletal pain. Skin: Negative for rash, abrasions, lacerations, ecchymosis. Neurological: Negative for headaches, focal weakness or numbness. 10-point ROS otherwise negative.  ____________________________________________   PHYSICAL EXAM:  VITAL SIGNS: ED Triage Vitals  Enc Vitals Group     BP 07/01/18 2223 (!) 165/90     Pulse Rate 07/01/18 2223 93     Resp 07/01/18 2223 17     Temp 07/01/18 2223 98 F (36.7 C)     Temp Source 07/01/18 2223 Oral     SpO2 07/01/18 2223 100 %     Weight 07/01/18 2224 168 lb (76.2 kg)     Height --      Head Circumference --      Peak Flow --      Pain Score --      Pain Loc --      Pain Edu? --      Excl. in GC? --      Constitutional: Alert and oriented. Well appearing and in no acute distress. Eyes: Conjunctivae are normal. PERRL. EOMI. Head: Atraumatic. ENT:      Ears:       Nose: No congestion/rhinnorhea.      Mouth/Throat: Mucous membranes are moist.  Multiple caries noted throughout dentition.  Patient does have irritation, evidence of significant gingivitis around the 20th, 29th, 30th tooth.  No indication of deep space infection.  Uvula is midline. Neck: No stridor.   Cardiovascular: Normal rate, regular rhythm. Normal S1 and S2.  Good peripheral circulation. Respiratory: Normal respiratory effort without tachypnea or retractions. Lungs CTAB. Good air entry to the bases with no decreased or absent breath sounds. Musculoskeletal: Full range of motion to all extremities. No gross deformities appreciated. Neurologic:  Normal speech and language. No gross focal neurologic deficits are appreciated.  Skin:  Skin is warm, dry and intact. No rash noted. Psychiatric: Mood and affect are normal. Speech and behavior are normal. Patient exhibits appropriate insight and judgement.   ____________________________________________   LABS (all labs ordered are listed, but only abnormal results  are displayed)  Labs Reviewed - No data to display ____________________________________________  EKG   ____________________________________________  RADIOLOGY   No results found.  ____________________________________________    PROCEDURES  Procedure(s) performed:    Procedures    Medications  HYDROcodone-acetaminophen (NORCO/VICODIN) 5-325 MG per tablet 1 tablet (has no administration in time range)  doxycycline (VIBRA-TABS) tablet 100 mg (has no administration in time range)     ____________________________________________   INITIAL IMPRESSION / ASSESSMENT AND PLAN / ED COURSE  Pertinent labs & imaging results that were available during my care of the patient were reviewed by me and considered in my medical decision making (see chart for details).  Review of the [place] CSRS was performed in accordance of the [facility] prior to dispensing any controlled drugs.      Patient's diagnosis is consistent with dentalgia and gingivitis.  Patient presents the emergency department complaining of right  lower dental pain.  Findings are consistent with significant gingivitis, dental caries to the area.  No indication of deep space infection.  No indication of abscess requiring incision and drainage.  No indication at this time for labs or imaging.  Patient will be placed on chlorhexidine mouthwash, Magic mouthwash, doxycycline as patient is allergic to penicillins.  Patient is to follow-up with primary care or dentist as needed..  Patient is given ED precautions to return to the ED for any worsening or new symptoms.     ____________________________________________  FINAL CLINICAL IMPRESSION(S) / ED DIAGNOSES  Final diagnoses:  Dentalgia  Gingivitis      NEW MEDICATIONS STARTED DURING THIS VISIT:  ED Discharge Orders        Ordered    doxycycline (VIBRA-TABS) 100 MG tablet  2 times daily     07/01/18 2314    chlorhexidine (PERIDEX) 0.12 % solution  2 times daily      07/01/18 2314    magic mouthwash w/lidocaine SOLN  4 times daily    Note to Pharmacy:  Dispense in a 1/1/1 ratio. Use lidocaine, diphenhydramine, prednisolone   07/01/18 2314          This chart was dictated using voice recognition software/Dragon. Despite best efforts to proofread, errors can occur which can change the meaning. Any change was purely unintentional.    Racheal Patches, PA-C 07/01/18 2315    Arnaldo Natal, MD 07/01/18 2340

## 2018-07-01 NOTE — ED Triage Notes (Signed)
Pt c/o right upper and lower dental pain x2 months that radiates into the jaw, ear and neck. Swelling noted to area.

## 2018-07-01 NOTE — Discharge Instructions (Addendum)
OPTIONS FOR DENTAL FOLLOW UP CARE ° °[place] Department of Health and Human Services - Local Safety Net Dental Clinics °http://www.ncdhhs.gov/dph/oralhealth/services/safetynetclinics.htm °  °[facility] (336-562-3123) ° °[facility] [place] (919-933-9087) ° °[facility] ([phone] ext [phone]) ° °[facility] ([phone]) ° °[facility] ([phone]) °This clinic caters to the indigent population and is on a lottery system. °Location: °[facility], [address], [place] °Clinic Hours: °Wednesdays from 6pm - 9pm, patients seen by a lottery system. °For dates, call or go to [email or web address].edu/[facility]/patients/[email or web address]-[email or web address] °Services: °Cleanings, fillings and simple extractions. °Payment Options: °DENTAL WORK IS FREE OF CHARGE. Bring proof of income or support. °Best way to get seen: °Arrive at 5:15 pm - this is a lottery, NOT first come/first serve, so arriving earlier will not increase your chances of being seen. °  °  °[facility] °[phone] °Select option 1 for emergencies °  °Location: °[facility], [address], [place] °Clinic Hours: °No walk-ins accepted - call the day before to schedule an appointment. °Check in times are 9:30 am and 1:30 pm. °Services: °Simple extractions, temporary fillings, pulpectomy/pulp debridement, uncomplicated abscess drainage. °Payment Options: °PAYMENT IS DUE AT THE TIME OF SERVICE.  Fee is usually $100-200, additional surgical procedures (e.g. abscess drainage) may be extra. °Cash, checks, Visa/MasterCard accepted.  Can file Medicaid if patient is covered for dental - patient should call case worker to check. °No discount for [facility] patients. °Best way to get seen: °MUST call the day before and get onto the schedule. Can usually be seen the next 1-2 days. No walk-ins accepted. °  °  °[facility] °[phone] °   °Location: °[facility], [address], [place] °Clinic Hours: °M, W, Th, F 8am or 1:30pm, Tues 9a or 1:30 - first come/first served. °Services: °Simple extractions, temporary fillings, uncomplicated abscess drainage.  You do not need to be an [place] resident. °Payment Options: °PAYMENT IS DUE AT THE TIME OF SERVICE. °Dental insurance, otherwise sliding scale - bring proof of income or support. °Depending on income and treatment needed, cost is usually $50-200. °Best way to get seen: °Arrive early as it is first come/first served. °  °  °[facility] °[phone] °  °Location: °[address] °Clinic Hours: °Mon-Thu 8a-5p °Services: °Most basic dental services including extractions and fillings. °Payment Options: °PAYMENT IS DUE AT THE TIME OF SERVICE. °Sliding scale, up to 50% off - bring proof if income or support. °Medicaid with dental option accepted. °Best way to get seen: °Call to schedule an appointment, can usually be seen within 2 weeks OR they will try to see walk-ins - show up at 8a or 2p (you may have to wait). °  °  °[facility] °[phone] °[place] RESIDENTS ONLY °  °Location: °[facility], [address], [place], [place] [address] °Clinic Hours: By appointment only. °Monday - Thursday 8am-5pm, Friday 8am-12pm °Services: Cleanings, fillings, extractions. °Payment Options: °PAYMENT IS DUE AT THE TIME OF SERVICE. °Cash, Visa or MasterCard. Sliding scale - $30 minimum per service. °Best way to get seen: °Come in to office, complete packet and make an appointment - need proof of income °or support monies for each household member and proof of [place] residence. °Usually takes about a month to get in. °  °  °[facility] °[phone] °  °Location: °[address]   [place] °Clinic Hours: Walk-in Urgent Care Dental Services are offered Monday-Friday  mornings only. °The numbers of emergencies accepted daily is limited to the number of °providers available. °Maximum 15 - Mondays, Wednesdays & Thursdays °Maximum 10 - Tuesdays & Fridays °Services: °You do not need to be a [place] resident to be seen for a dental emergency. °Emergencies are defined as pain, swelling, abnormal bleeding, or dental trauma. Walkins will receive x-rays if needed. °NOTE: Dental cleaning is not an emergency. °Payment Options: °PAYMENT IS DUE AT THE TIME OF SERVICE. °Minimum co-pay is $40.00 for uninsured patients. °Minimum co-pay is $3.00 for Medicaid with dental coverage. °Dental Insurance is accepted and must be presented at time of visit. °Medicare does not cover dental. °Forms of payment: Cash, credit card, checks. °Best way to get seen: °If not previously registered with the clinic, walk-in dental registration begins at 7:15 am and is on a first come/first serve basis. °If previously registered with the clinic, call to make an appointment. °  °  °[facility] °[phone] °[place] RESIDENTS ONLY °  °Location: °[address], [place] °Clinic Hours: °Mon-Thu 10a-2p °Services: Extractions only! °Payment Options: °FREE (donations accepted) - bring proof of income or support °Best way to get seen: °Call and schedule an appointment OR come at 8am on the 1st Monday of every month (except for holidays) when it is first come/first served. °  °  °[facility] °[phone] °  °Location: °[address], [place] °Clinic Hours: °Friday mornings °Services, Payment Options, Best way to get seen: °Call for info °

## 2018-09-26 ENCOUNTER — Emergency Department
Admission: EM | Admit: 2018-09-26 | Discharge: 2018-09-26 | Discharge status: Against Medical Advice | Payer: BLUE CROSS/BLUE SHIELD

## 2018-09-26 NOTE — ED Triage Notes (Signed)
Pt reports she is leaving. States that she was here last night and was told there was a wait and she was not waiting today again. Pt encouraged to stay and be seen however patients states she is leaving. Pt husband reports he will take her to [facility] or [place].

## 2018-09-28 ENCOUNTER — Other Ambulatory Visit: Payer: Self-pay

## 2018-09-28 ENCOUNTER — Emergency Department
Admission: EM | Admit: 2018-09-28 | Discharge: 2018-09-28 | Disposition: A | Discharge status: Home / Self Care | Payer: Medicaid Other | Attending: Emergency Medicine | Admitting: Emergency Medicine

## 2018-09-28 DIAGNOSIS — O24111 Pre-existing diabetes mellitus, type 2, in pregnancy, first trimester: Secondary | ICD-10-CM | POA: Insufficient documentation

## 2018-09-28 DIAGNOSIS — E1140 Type 2 diabetes mellitus with diabetic neuropathy, unspecified: Secondary | ICD-10-CM | POA: Insufficient documentation

## 2018-09-28 DIAGNOSIS — I252 Old myocardial infarction: Secondary | ICD-10-CM | POA: Insufficient documentation

## 2018-09-28 DIAGNOSIS — Z87891 Personal history of nicotine dependence: Secondary | ICD-10-CM | POA: Diagnosis not present

## 2018-09-28 DIAGNOSIS — O9989 Other specified diseases and conditions complicating pregnancy, childbirth and the puerperium: Secondary | ICD-10-CM | POA: Diagnosis present

## 2018-09-28 DIAGNOSIS — Z3A01 Less than 8 weeks gestation of pregnancy: Secondary | ICD-10-CM

## 2018-09-28 DIAGNOSIS — O10011 Pre-existing essential hypertension complicating pregnancy, first trimester: Secondary | ICD-10-CM | POA: Diagnosis not present

## 2018-09-28 DIAGNOSIS — Z79899 Other long term (current) drug therapy: Secondary | ICD-10-CM | POA: Diagnosis not present

## 2018-09-28 DIAGNOSIS — Z7982 Long term (current) use of aspirin: Secondary | ICD-10-CM | POA: Diagnosis not present

## 2018-09-28 DIAGNOSIS — J069 Acute upper respiratory infection, unspecified: Secondary | ICD-10-CM | POA: Diagnosis not present

## 2018-09-28 DIAGNOSIS — J45909 Unspecified asthma, uncomplicated: Secondary | ICD-10-CM | POA: Diagnosis not present

## 2018-09-28 LAB — URINALYSIS, COMPLETE (UACMP) WITH MICROSCOPIC
BILIRUBIN URINE: NEGATIVE
Bacteria, UA: NONE SEEN
GLUCOSE, UA: NEGATIVE mg/dL
Hgb urine dipstick: NEGATIVE
Ketones, ur: NEGATIVE mg/dL
LEUKOCYTES UA: NEGATIVE
Nitrite: NEGATIVE
Protein, ur: NEGATIVE mg/dL
Specific Gravity, Urine: 1.025 (ref 1.005–1.030)
pH: 5 (ref 5.0–8.0)

## 2018-09-28 LAB — POCT PREGNANCY, URINE: PREG TEST UR: POSITIVE — AB

## 2018-09-28 MED ORDER — CHLORPHENIRAMINE MALEATE 4 MG PO TABS: 4.0000 mg | ORAL_TABLET | Freq: Two times a day (BID) | ORAL | 0 refills | Status: DC | PRN

## 2018-09-28 MED ORDER — CROMOLYN SODIUM 5.2 MG/ACT NA AERS: 1.0000 | INHALATION_SPRAY | Freq: Three times a day (TID) | NASAL | 0 refills | Status: DC

## 2018-09-28 NOTE — ED Triage Notes (Signed)
Pt reports that she is about [redacted] weeks pregnant. She has had cold symptoms for about a week. Reports congestion, cough, SOB, trouble laying down, and brown sputum. Also reports back pain. She has been taking tylenol cold and flu. No fevers. Nausea present but no V/D.  PT WOULD LIKE A PREG BLOOD TEST.

## 2018-09-28 NOTE — ED Notes (Signed)
Pt reports  Symptoms of cough and congestion sputum is  Michelle Osborn  Also has urinary frequent ct pt has positive preg test Thursday

## 2018-09-28 NOTE — ED Provider Notes (Signed)
[facility] Emergency Department Provider Note   ____________________________________________   First MD Initiated Contact with Patient 09/28/18 1100     (approximate)  I have reviewed the triage vital signs and the nursing notes.   HISTORY  Chief Complaint Cough and Nasal Congestion   HPI Michelle Osborn is a 32 y.o. female resents to the ED with complaint of cold symptoms for approximately 1 week.  Patient states that she has had congestion, productive cough and some shortness of breath.  She has been taking over-the-counter Tylenol cold and flu without relief.  She continues to have nasal congestion which makes it difficult for her to breathe.  She also complains of nausea but presently states this is resolved.  There is been no vomiting or diarrhea.  She is unaware of any fever or chills.  She states no one else in the family has symptoms.  She also wants to have a another pregnancy test done.  She took a urine pregnancy test at home which was positive.  She denies any abdominal pain or vaginal bleeding.  Patient continues to smoke.   Past Medical History:  Diagnosis Date  . Asthma   . Diabetes mellitus   . Hypertension   . MI (myocardial infarction) (HCC)   . Neuropathy     Patient Active Problem List   Diagnosis Date Noted  . HYPERCHOLESTEROLEMIA 12/08/2009  . MORBID OBESITY 12/08/2009  . HIDRADENITIS SUPPURATIVA 12/08/2009  . VITAMIN D DEFICIENCY 11/18/2009  . GERD 11/18/2009  . BACTERIAL VAGINITIS 11/18/2009  . MENORRHAGIA 11/18/2009  . DIABETES MELLITUS, TYPE II 11/17/2009  . OBESITY 11/17/2009  . HYPERTENSION 11/17/2009  . ASTHMA 11/17/2009  . VAGINAL DISCHARGE 11/17/2009    Past Surgical History:  Procedure Laterality Date  . ADENOIDECTOMY    . APPENDECTOMY    . DILATION AND CURETTAGE OF UTERUS    . OTHER SURGICAL HISTORY     sweat gland excision  . TONSILLECTOMY      Prior to Admission medications   Medication Sig Start Date  End Date Taking? Authorizing Provider  aspirin 81 MG chewable tablet Chew 81 mg by mouth daily.    [provider]  carvedilol (COREG) 25 MG tablet Take 25 mg by mouth 2 (two) times daily with a meal.    [provider]  cetirizine (ZYRTEC) 10 MG tablet Take 10 mg by mouth daily.    [provider]  chlorhexidine (PERIDEX) 0.12 % solution Use as directed 10 mLs in the mouth or throat 2 (two) times daily. Swish and spit 07/01/18   Cuthriell, Delorise Royals, PA-C  chlorpheniramine (CHLOR-TRIMETON) 4 MG tablet Take 1 tablet (4 mg total) by mouth 2 (two) times daily as needed for allergies. 09/28/18   Tommi Rumps, PA-C  cromolyn (NASALCROM) 5.2 MG/ACT nasal spray Place 1 spray into both nostrils 3 (three) times daily. 09/28/18 09/28/19  Tommi Rumps, PA-C  doxycycline (VIBRA-TABS) 100 MG tablet Take 1 tablet (100 mg total) by mouth 2 (two) times daily. 07/01/18   Cuthriell, Delorise Royals, PA-C  ferrous sulfate 325 (65 FE) MG tablet Take 325 mg by mouth 2 (two) times daily.    [provider]  magic mouthwash w/lidocaine SOLN Take 5 mLs by mouth 4 (four) times daily. 07/01/18   Cuthriell, Delorise Royals, PA-C  methimazole (TAPAZOLE) 10 MG tablet Take 10 mg by mouth 3 (three) times daily.    [provider]  pyridOXINE (VITAMIN B-6) 50 MG tablet Take 25 mg  by mouth 3 (three) times daily.    [provider]  triamcinolone cream (KENALOG) 0.1 % Apply 1 application topically 2 (two) times daily.    [provider]    Allergies Darvocet [propoxyphene n-acetaminophen]; Peanut-containing drug products; Penicillins; Cephalexin; Tramadol; and Toradol [ketorolac tromethamine]  Family History  Problem Relation Age of Onset  . Hypertension Mother   . Hypertension Father     Social History Social History   Tobacco Use  . Smoking status: Former Smoker    Packs/day: 1.00    Years: 10.00    Pack years: 10.00  . Smokeless tobacco: Never Used  Substance  Use Topics  . Alcohol use: No  . Drug use: Yes    Types: Marijuana    Review of Systems Constitutional: No fever/chills Eyes: No visual changes. ENT: Positive nasal congestion.  Negative for ear pain. Cardiovascular: Denies chest pain. Respiratory: Denies shortness of breath.  Positive productive cough. Gastrointestinal: No abdominal pain.  Positive nausea, no vomiting.  No diarrhea.   Genitourinary: Negative for dysuria.  Negative for vaginal complaints. Musculoskeletal: Negative for back pain. Skin: Negative for rash. Neurological: Negative for headaches, focal weakness or numbness. ___________________________________________   PHYSICAL EXAM:  VITAL SIGNS: ED Triage Vitals  Enc Vitals Group     BP 09/28/18 1006 (!) 133/95     Pulse Rate 09/28/18 1006 (!) 107     Resp 09/28/18 1006 20     Temp 09/28/18 1006 98.2 F (36.8 C)     Temp Source 09/28/18 1006 Oral     SpO2 09/28/18 1006 98 %     Weight 09/28/18 1008 184 lb 11.9 oz (83.8 kg)     Height 09/28/18 1008 5\' 7"  (1.702 m)     Head Circumference --      Peak Flow --      Pain Score 09/28/18 1007 8     Pain Loc --      Pain Edu? --      Excl. in GC? --     Constitutional: Alert and oriented. Well appearing and in no acute distress. Eyes: Conjunctivae are normal. PERRL. EOMI. Head: Atraumatic. Nose: Moderate congestion/rhinnorhea.  EACs and TMs are clear bilaterally. Mouth/Throat: Mucous membranes are moist.  Oropharynx non-erythematous.  No exudate and uvula is midline. Neck: No stridor.   Hematological/Lymphatic/Immunilogical: No cervical lymphadenopathy. Cardiovascular: Normal rate, regular rhythm. Grossly normal heart sounds.  Good peripheral circulation. Respiratory: Normal respiratory effort.  No retractions. Lungs CTAB. Gastrointestinal: Soft and nontender. No distention.  Musculoskeletal: Moves upper and lower extremities without any difficulty.  Normal gait was noted. Neurologic:  Normal speech and  language. No gross focal neurologic deficits are appreciated. No gait instability. Skin:  Skin is warm, dry and intact. No rash noted. Psychiatric: Mood and affect are normal. Speech and behavior are normal.  ____________________________________________   LABS (all labs ordered are listed, but only abnormal results are displayed)  Labs Reviewed  URINALYSIS, COMPLETE (UACMP) WITH MICROSCOPIC - Abnormal; Notable for the following components:      Result Value   Color, Urine YELLOW (*)    APPearance CLEAR (*)    All other components within normal limits  POCT PREGNANCY, URINE - Abnormal; Notable for the following components:   Preg Test, Ur POSITIVE (*)    All other components within normal limits  POC URINE PREG, ED    PROCEDURES  Procedure(s) performed: None  Procedures  Critical Care performed: No  ____________________________________________   INITIAL IMPRESSION / ASSESSMENT  AND PLAN / ED COURSE  As part of my medical decision making, I reviewed the following data within the electronic MEDICAL RECORD NUMBER Notes from prior ED visits and [place] Controlled Substance Database  Patient presents to the ED with complaint of upper respiratory symptoms for approximately 1 week.  She denies any fever or chills.  There is been a productive cough with nasal congestion.  She denies any previous history of asthma.  Patient is positive pregnancy test to be seen by OB/GYN.  This information was given to her for this purpose.  Is uncertain as to what her over-the-counter medication truly contains.  After consulting up-to-date patient was given a prescription for Nasalcrom for nasal congestion and Chlor-Trimeton 4 mg twice a day for sneezing and upper respiratory symptoms.  She is encouraged to increase fluids.  She will follow-up with her PCP or OB/GYN if any continued problems.  ____________________________________________   FINAL CLINICAL IMPRESSION(S) / ED DIAGNOSES  Final diagnoses:  Upper  respiratory tract infection, unspecified type  Less than [redacted] weeks gestation of pregnancy     ED Discharge Orders         Ordered    cromolyn (NASALCROM) 5.2 MG/ACT nasal spray  3 times daily     09/28/18 1223    chlorpheniramine (CHLOR-TRIMETON) 4 MG tablet  2 times daily PRN     09/28/18 1223           Note:  This document was prepared using Dragon voice recognition software and may include unintentional dictation errors.    Tommi Rumps, PA-C 09/28/18 1849    Arnaldo Natal, MD 10/03/18 1455

## 2018-09-28 NOTE — Discharge Instructions (Addendum)
Follow-up with your OB/GYN or the [facility] department.  Your pregnancy test today is positive.  Also medication for your upper respiratory infection has been sent to your pharmacy.  May also use saline nose spray to help with nasal congestion.  Increase fluids.  Limited amount of Tylenol if needed for aching or headache.

## 2018-10-14 ENCOUNTER — Emergency Department
Admission: EM | Admit: 2018-10-14 | Discharge: 2018-10-14 | Disposition: A | Discharge status: Home / Self Care | Payer: Medicaid Other | Attending: Emergency Medicine | Admitting: Emergency Medicine

## 2018-10-14 ENCOUNTER — Emergency Department: Payer: Medicaid Other

## 2018-10-14 ENCOUNTER — Other Ambulatory Visit: Payer: Self-pay

## 2018-10-14 DIAGNOSIS — R102 Pelvic and perineal pain: Secondary | ICD-10-CM | POA: Diagnosis not present

## 2018-10-14 DIAGNOSIS — Z7982 Long term (current) use of aspirin: Secondary | ICD-10-CM | POA: Insufficient documentation

## 2018-10-14 DIAGNOSIS — E119 Type 2 diabetes mellitus without complications: Secondary | ICD-10-CM | POA: Insufficient documentation

## 2018-10-14 DIAGNOSIS — O26899 Other specified pregnancy related conditions, unspecified trimester: Secondary | ICD-10-CM | POA: Diagnosis not present

## 2018-10-14 DIAGNOSIS — R109 Unspecified abdominal pain: Secondary | ICD-10-CM

## 2018-10-14 DIAGNOSIS — Z87891 Personal history of nicotine dependence: Secondary | ICD-10-CM | POA: Insufficient documentation

## 2018-10-14 DIAGNOSIS — O26891 Other specified pregnancy related conditions, first trimester: Secondary | ICD-10-CM

## 2018-10-14 DIAGNOSIS — Z91010 Allergy to peanuts: Secondary | ICD-10-CM | POA: Insufficient documentation

## 2018-10-14 DIAGNOSIS — J45909 Unspecified asthma, uncomplicated: Secondary | ICD-10-CM | POA: Insufficient documentation

## 2018-10-14 DIAGNOSIS — Z3A09 9 weeks gestation of pregnancy: Secondary | ICD-10-CM | POA: Insufficient documentation

## 2018-10-14 DIAGNOSIS — N390 Urinary tract infection, site not specified: Secondary | ICD-10-CM | POA: Diagnosis not present

## 2018-10-14 DIAGNOSIS — O219 Vomiting of pregnancy, unspecified: Secondary | ICD-10-CM | POA: Diagnosis present

## 2018-10-14 DIAGNOSIS — O210 Mild hyperemesis gravidarum: Secondary | ICD-10-CM

## 2018-10-14 DIAGNOSIS — I10 Essential (primary) hypertension: Secondary | ICD-10-CM | POA: Diagnosis not present

## 2018-10-14 DIAGNOSIS — Z79899 Other long term (current) drug therapy: Secondary | ICD-10-CM | POA: Insufficient documentation

## 2018-10-14 DIAGNOSIS — R1030 Lower abdominal pain, unspecified: Secondary | ICD-10-CM

## 2018-10-14 LAB — COMPREHENSIVE METABOLIC PANEL
ALBUMIN: 4.2 g/dL (ref 3.5–5.0)
ALT: 23 U/L (ref 0–44)
AST: 21 U/L (ref 15–41)
Alkaline Phosphatase: 179 U/L — ABNORMAL HIGH (ref 38–126)
Anion gap: 9 (ref 5–15)
BUN: 7 mg/dL (ref 6–20)
CHLORIDE: 107 mmol/L (ref 98–111)
CO2: 20 mmol/L — AB (ref 22–32)
CREATININE: 0.41 mg/dL — AB (ref 0.44–1.00)
Calcium: 9.6 mg/dL (ref 8.9–10.3)
GFR calc Af Amer: >60 mL/min (ref >60)
GFR calc non Af Amer: >60 mL/min (ref >60)
GLUCOSE: 102 mg/dL — AB (ref 70–99)
Potassium: 4.1 mmol/L (ref 3.5–5.1)
SODIUM: 136 mmol/L (ref 135–145)
Total Bilirubin: 0.7 mg/dL (ref 0.3–1.2)
Total Protein: 7.6 g/dL (ref 6.5–8.1)

## 2018-10-14 LAB — CBC
HEMATOCRIT: 39.1 % (ref 36.0–46.0)
HEMOGLOBIN: 12.9 g/dL (ref 12.0–15.0)
MCH: 25.4 pg — ABNORMAL LOW (ref 26.0–34.0)
MCHC: 33 g/dL (ref 30.0–36.0)
MCV: 77 fL — AB (ref 80.0–100.0)
Platelets: 277 10*3/uL (ref 150–400)
RBC: 5.08 MIL/uL (ref 3.87–5.11)
RDW: 13.7 % (ref 11.5–15.5)
WBC: 7 10*3/uL (ref 4.0–10.5)
nRBC: 0 % (ref 0.0–0.2)

## 2018-10-14 LAB — URINALYSIS, COMPLETE (UACMP) WITH MICROSCOPIC
Bilirubin Urine: NEGATIVE
Glucose, UA: NEGATIVE mg/dL
HGB URINE DIPSTICK: NEGATIVE
Ketones, ur: 20 mg/dL — AB
Leukocytes, UA: NEGATIVE
NITRITE: NEGATIVE
Protein, ur: NEGATIVE mg/dL
Specific Gravity, Urine: 1.018 (ref 1.005–1.030)
pH: 5 (ref 5.0–8.0)

## 2018-10-14 LAB — HCG, QUANTITATIVE, PREGNANCY: HCG, BETA CHAIN, QUANT, S: 99198 m[IU]/mL — AB (ref <5)

## 2018-10-14 LAB — LIPASE, BLOOD: LIPASE: 25 U/L (ref 11–51)

## 2018-10-14 MED ORDER — NITROFURANTOIN MONOHYD MACRO 100 MG PO CAPS: 100.0000 mg | ORAL_CAPSULE | Freq: Two times a day (BID) | ORAL | 0 refills | Status: AC

## 2018-10-14 MED ORDER — DEXTROSE 5 % AND 0.45 % NACL IV BOLUS
1000.0000 mL | Freq: Once | INTRAVENOUS | Status: AC
  Administered 2018-10-14: 1000 mL via INTRAVENOUS
  Filled 2018-10-14: qty 1000

## 2018-10-14 MED ORDER — DOXYLAMINE SUCCINATE (SLEEP) 25 MG PO TABS: 12.5000 mg | ORAL_TABLET | Freq: Every evening | ORAL | 0 refills | Status: DC | PRN

## 2018-10-14 MED ORDER — METOCLOPRAMIDE HCL 5 MG/ML IJ SOLN
10.0000 mg | Freq: Once | INTRAMUSCULAR | Status: AC
  Administered 2018-10-14: 10 mg via INTRAVENOUS
  Filled 2018-10-14: qty 2

## 2018-10-14 MED ORDER — METOCLOPRAMIDE HCL 10 MG PO TABS: 10.0000 mg | ORAL_TABLET | Freq: Four times a day (QID) | ORAL | 0 refills | Status: DC | PRN

## 2018-10-14 NOTE — ED Provider Notes (Signed)
[facility] Emergency Department Provider Note  ____________________________________________   First MD Initiated Contact with Patient 10/14/18 1829     (approximate)  I have reviewed the triage vital signs and the nursing notes.   HISTORY  Chief Complaint Emesis During Pregnancy   HPI Michelle Osborn is a 32 y.o. female was approximate [redacted] weeks pregnant was presented emergency department today with cramping abdominal pain associate with nausea vomiting.  Denies any diarrhea.  Denies any pain at this time but says it feels like a tightness before she vomits.  Denies any vaginal bleeding or discharge.  Denies any burning with urination.  Says that she has not established prenatal care but will be seeing [facility] family [facility].  Says that she is a history of high blood pressure and is on carvedilol.  Says that she has been taking over-the-counter prenatal vitamins as well.  Patient states that in previous pregnancies that her nausea has been relieved with Reglan and half a Unisom.   Past Medical History:  Diagnosis Date  . Asthma   . Diabetes mellitus   . Hypertension   . MI (myocardial infarction) (HCC)   . Neuropathy     Patient Active Problem List   Diagnosis Date Noted  . HYPERCHOLESTEROLEMIA 12/08/2009  . MORBID OBESITY 12/08/2009  . HIDRADENITIS SUPPURATIVA 12/08/2009  . VITAMIN D DEFICIENCY 11/18/2009  . GERD 11/18/2009  . BACTERIAL VAGINITIS 11/18/2009  . MENORRHAGIA 11/18/2009  . DIABETES MELLITUS, TYPE II 11/17/2009  . OBESITY 11/17/2009  . HYPERTENSION 11/17/2009  . ASTHMA 11/17/2009  . VAGINAL DISCHARGE 11/17/2009    Past Surgical History:  Procedure Laterality Date  . ADENOIDECTOMY    . APPENDECTOMY    . DILATION AND CURETTAGE OF UTERUS    . OTHER SURGICAL HISTORY     sweat gland excision  . TONSILLECTOMY      Prior to Admission medications   Medication Sig Start Date End Date Taking? Authorizing Provider  aspirin 81 MG chewable  tablet Chew 81 mg by mouth daily.    [provider]  carvedilol (COREG) 25 MG tablet Take 25 mg by mouth 2 (two) times daily with a meal.    [provider]  cetirizine (ZYRTEC) 10 MG tablet Take 10 mg by mouth daily.    [provider]  chlorhexidine (PERIDEX) 0.12 % solution Use as directed 10 mLs in the mouth or throat 2 (two) times daily. Swish and spit 07/01/18   Cuthriell, Delorise Royals, PA-C  chlorpheniramine (CHLOR-TRIMETON) 4 MG tablet Take 1 tablet (4 mg total) by mouth 2 (two) times daily as needed for allergies. 09/28/18   Tommi Rumps, PA-C  cromolyn (NASALCROM) 5.2 MG/ACT nasal spray Place 1 spray into both nostrils 3 (three) times daily. 09/28/18 09/28/19  Tommi Rumps, PA-C  doxycycline (VIBRA-TABS) 100 MG tablet Take 1 tablet (100 mg total) by mouth 2 (two) times daily. 07/01/18   Cuthriell, Delorise Royals, PA-C  ferrous sulfate 325 (65 FE) MG tablet Take 325 mg by mouth 2 (two) times daily.    [provider]  magic mouthwash w/lidocaine SOLN Take 5 mLs by mouth 4 (four) times daily. 07/01/18   Cuthriell, Delorise Royals, PA-C  methimazole (TAPAZOLE) 10 MG tablet Take 10 mg by mouth 3 (three) times daily.    [provider]  pyridOXINE (VITAMIN B-6) 50 MG tablet Take 25 mg by mouth 3 (three) times daily.    [provider]  triamcinolone cream (KENALOG) 0.1 % Apply 1  application topically 2 (two) times daily.    [provider]    Allergies Darvocet [propoxyphene n-acetaminophen]; Peanut-containing drug products; Penicillins; Cephalexin; Tramadol; and Toradol [ketorolac tromethamine]  Family History  Problem Relation Age of Onset  . Hypertension Mother   . Hypertension Father     Social History Social History   Tobacco Use  . Smoking status: Former Smoker    Packs/day: 1.00    Years: 10.00    Pack years: 10.00  . Smokeless tobacco: Never Used  Substance Use Topics  . Alcohol use: No  . Drug use: Yes    Types:  Marijuana    Review of Systems  Constitutional: No fever/chills Eyes: No visual changes. ENT: No sore throat. Cardiovascular: Denies chest pain. Respiratory: Denies shortness of breath. Gastrointestinal:  No diarrhea.  No constipation. Genitourinary: Negative for dysuria. Musculoskeletal: Negative for back pain. Skin: Negative for rash. Neurological: Negative for headaches, focal weakness or numbness.   ____________________________________________   PHYSICAL EXAM:  VITAL SIGNS: ED Triage Vitals  Enc Vitals Group     BP 10/14/18 1745 (!) 146/91     Pulse Rate 10/14/18 1745 98     Resp 10/14/18 1745 18     Temp 10/14/18 1745 98.5 F (36.9 C)     Temp Source 10/14/18 1745 Oral     SpO2 10/14/18 1745 100 %     Weight 10/14/18 1746 185 lb (83.9 kg)     Height 10/14/18 1746 5\' 7"  (1.702 m)     Head Circumference --      Peak Flow --      Pain Score 10/14/18 1916 8     Pain Loc --      Pain Edu? --      Excl. in GC? --     Constitutional: Alert and oriented. Well appearing and in no acute distress. Eyes: Conjunctivae are normal.  Head: Atraumatic. Nose: No congestion/rhinnorhea. Mouth/Throat: Mucous membranes are moist.  Neck: No stridor.   Cardiovascular: Normal rate, regular rhythm. Grossly normal heart sounds.   Respiratory: Normal respiratory effort.  No retractions. Lungs CTAB. Gastrointestinal: Soft and nontender. No distention.  Musculoskeletal: No lower extremity tenderness nor edema.  No joint effusions. Neurologic:  Normal speech and language. No gross focal neurologic deficits are appreciated. Skin:  Skin is warm, dry and intact. No rash noted. Psychiatric: Mood and affect are normal. Speech and behavior are normal.  ____________________________________________   LABS (all labs ordered are listed, but only abnormal results are displayed)  Labs Reviewed  COMPREHENSIVE METABOLIC PANEL - Abnormal; Notable for the following components:      Result Value     CO2 20 (*)    Glucose, Bld 102 (*)    Creatinine, Ser 0.41 (*)    Alkaline Phosphatase 179 (*)    All other components within normal limits  CBC - Abnormal; Notable for the following components:   MCV 77.0 (*)    MCH 25.4 (*)    All other components within normal limits  URINALYSIS, COMPLETE (UACMP) WITH MICROSCOPIC - Abnormal; Notable for the following components:   Color, Urine YELLOW (*)    APPearance CLEAR (*)    Ketones, ur 20 (*)    Bacteria, UA RARE (*)    All other components within normal limits  HCG, QUANTITATIVE, PREGNANCY - Abnormal; Notable for the following components:   hCG, Beta Chain, Quant, S 99,198 (*)    All other components within normal limits  URINE CULTURE  LIPASE, BLOOD  ____________________________________________  EKG   ____________________________________________  RADIOLOGY  Single viable IUP at 8 weeks 0 days. ____________________________________________   PROCEDURES  Procedure(s) performed:   Procedures  Critical Care performed:   ____________________________________________   INITIAL IMPRESSION / ASSESSMENT AND PLAN / ED COURSE  Pertinent labs & imaging results that were available during my care of the patient were reviewed by me and considered in my medical decision making (see chart for details).  Differential diagnosis includes, but is not limited to, threatened miscarriage, incomplete miscarriage, normal bleeding from an early trimester pregnancy, ectopic pregnancy, , blighted ovum, vaginal/cervical trauma, subchorionic hemorrhage/hematoma, etc. As part of my medical decision making, I reviewed the following data within the electronic MEDICAL RECORD NUMBER Notes from prior ED visits  ----------------------------------------- 9:09 PM on 10/14/2018 -----------------------------------------  Patient at this time tolerating p.o. fluids as well as crackers.  Says that Unisom, half tab as well as Reglan has worked in the past.  Also  with bacteria in the urine.  Will treat because of pregnancy.  Patient be discharged at this time.  Will be following up at [facility]. ____________________________________________   FINAL CLINICAL IMPRESSION(S) / ED DIAGNOSES  Final diagnoses:  Lower abdominal pain  Hyperemesis gravidarum.  UTI.  Abdominal pain and pregnancy.    NEW MEDICATIONS STARTED DURING THIS VISIT:  New Prescriptions   No medications on file     Note:  This document was prepared using Dragon voice recognition software and may include unintentional dictation errors.     Myrna Blazer, MD 10/14/18 2110

## 2018-10-14 NOTE — ED Triage Notes (Addendum)
2nd pregnancy, [redacted] weeks pregnant. States pain in back and lower abd, states vomiting but is unable to eat anything. Symptoms x "a while." hasn't established OB care yet d/t being sick and not being able to make 1st appt. Denies discharge or bleeding.   A&Ox4. Ambulatory. No distress noted.

## 2018-10-14 NOTE — ED Notes (Signed)
Pt went to ultrasound.

## 2018-10-16 LAB — URINE CULTURE: CULTURE: NO GROWTH

## 2018-10-30 ENCOUNTER — Other Ambulatory Visit: Payer: Self-pay | Admitting: Obstetrics and Gynecology

## 2018-10-30 ENCOUNTER — Encounter: Payer: Self-pay | Admitting: Certified Nurse Midwife

## 2018-10-30 ENCOUNTER — Other Ambulatory Visit (INDEPENDENT_AMBULATORY_CARE_PROVIDER_SITE_OTHER): Payer: Medicaid Other

## 2018-10-30 ENCOUNTER — Other Ambulatory Visit: Payer: Self-pay | Admitting: Certified Nurse Midwife

## 2018-10-30 ENCOUNTER — Ambulatory Visit (INDEPENDENT_AMBULATORY_CARE_PROVIDER_SITE_OTHER): Payer: Medicaid Other | Admitting: Certified Nurse Midwife

## 2018-10-30 VITALS — BP 132/100 | HR 130 | Wt 180.3 lb

## 2018-10-30 DIAGNOSIS — O34219 Maternal care for unspecified type scar from previous cesarean delivery: Secondary | ICD-10-CM

## 2018-10-30 DIAGNOSIS — Z8639 Personal history of other endocrine, nutritional and metabolic disease: Secondary | ICD-10-CM

## 2018-10-30 DIAGNOSIS — Z1379 Encounter for other screening for genetic and chromosomal anomalies: Secondary | ICD-10-CM

## 2018-10-30 DIAGNOSIS — O219 Vomiting of pregnancy, unspecified: Secondary | ICD-10-CM | POA: Diagnosis not present

## 2018-10-30 DIAGNOSIS — O10911 Unspecified pre-existing hypertension complicating pregnancy, first trimester: Secondary | ICD-10-CM

## 2018-10-30 DIAGNOSIS — N911 Secondary amenorrhea: Secondary | ICD-10-CM

## 2018-10-30 DIAGNOSIS — F1291 Cannabis use, unspecified, in remission: Secondary | ICD-10-CM

## 2018-10-30 DIAGNOSIS — Z3201 Encounter for pregnancy test, result positive: Secondary | ICD-10-CM

## 2018-10-30 DIAGNOSIS — Z3A10 10 weeks gestation of pregnancy: Secondary | ICD-10-CM | POA: Diagnosis not present

## 2018-10-30 DIAGNOSIS — Z3491 Encounter for supervision of normal pregnancy, unspecified, first trimester: Secondary | ICD-10-CM

## 2018-10-30 DIAGNOSIS — Z87898 Personal history of other specified conditions: Secondary | ICD-10-CM

## 2018-10-30 DIAGNOSIS — O9989 Other specified diseases and conditions complicating pregnancy, childbirth and the puerperium: Secondary | ICD-10-CM

## 2018-10-30 DIAGNOSIS — E0500 Thyrotoxicosis with diffuse goiter without thyrotoxic crisis or storm: Secondary | ICD-10-CM | POA: Insufficient documentation

## 2018-10-30 DIAGNOSIS — O26899 Other specified pregnancy related conditions, unspecified trimester: Secondary | ICD-10-CM

## 2018-10-30 DIAGNOSIS — O3680X0 Pregnancy with inconclusive fetal viability, not applicable or unspecified: Secondary | ICD-10-CM | POA: Diagnosis not present

## 2018-10-30 DIAGNOSIS — O99891 Other specified diseases and conditions complicating pregnancy: Secondary | ICD-10-CM

## 2018-10-30 DIAGNOSIS — M549 Dorsalgia, unspecified: Secondary | ICD-10-CM

## 2018-10-30 DIAGNOSIS — F17200 Nicotine dependence, unspecified, uncomplicated: Secondary | ICD-10-CM

## 2018-10-30 DIAGNOSIS — Z202 Contact with and (suspected) exposure to infections with a predominantly sexual mode of transmission: Secondary | ICD-10-CM

## 2018-10-30 DIAGNOSIS — Z98891 History of uterine scar from previous surgery: Secondary | ICD-10-CM | POA: Insufficient documentation

## 2018-10-30 DIAGNOSIS — R109 Unspecified abdominal pain: Secondary | ICD-10-CM

## 2018-10-30 LAB — OB RESULTS CONSOLE GC/CHLAMYDIA: Gonorrhea: NEGATIVE

## 2018-10-30 LAB — OB RESULTS CONSOLE VARICELLA ZOSTER ANTIBODY, IGG: Varicella: IMMUNE

## 2018-10-30 MED ORDER — POLYETHYLENE GLYCOL 3350 17 GM/SCOOP PO POWD: 1.0000 | Freq: Once | ORAL | 0 refills | Status: AC

## 2018-10-30 MED ORDER — LABETALOL HCL 100 MG PO TABS: 100.0000 mg | ORAL_TABLET | Freq: Two times a day (BID) | ORAL | 1 refills | Status: DC

## 2018-10-30 MED ORDER — DOXYLAMINE-PYRIDOXINE 10-10 MG PO TBEC: 2.0000 | DELAYED_RELEASE_TABLET | Freq: Every day | ORAL | 5 refills | Status: DC

## 2018-10-30 NOTE — Progress Notes (Signed)
Michelle Osborn presents for NOB nurse interview visit.  Pregnancy confirmation done [facility] 10/14/2018.  G-2.  P-1.  LMP 08/15/2018.  EDD 05/22/2019. Dating scan done 10/14/2018.  Pregnancy education material explained and given.  0 Cats in the home.  NOB labs ordered.  Sickle cell:Ordered. HIV and drug screen were explained and ordered.  PNV encouraged.  Genetic screening options discussed.  Genetic testing: Declined.

## 2018-10-30 NOTE — Progress Notes (Signed)
NEW OB HISTORY AND PHYSICAL  SUBJECTIVE:       Michelle Osborn is a 32 y.o. [id number] female, Patient's last menstrual period was 08/15/2018 (exact date)., Estimated Date of Delivery: 05/22/19, [redacted]w[redacted]d, presents today for establishment of Prenatal Care.  Endorses nausea with daily vomiting, back pain and cramping in pregnancy, and constipation.   History significant for diabetes, thyroid disease, chronic hypertension-not currently taking medications, smoker, history marijuana use, and previous cesarean section for fetal intolerance to labor.   Desires genetic screening. Questions possibility of TOLAC.    Gynecologic History  Patient's last menstrual period was 08/15/2018 (exact date).  Period Cycle (Days): 28 Period Duration (Days): 3 Period Pattern: Regular Menstrual Flow: Moderate Menstrual Control: Tampon Dysmenorrhea: (!) Mild Dysmenorrhea Symptoms: Cramping  Contraception: none   Last Pap: 07/2018. Results were: normal per patient  Obstetric History  OB History  Gravida Para Term Preterm AB Living  2 1 1     1   SAB TAB Ectopic Multiple Live Births          1    # Outcome Date GA Lbr Len/2nd Weight Sex Delivery Anes PTL Lv  2 Current           1 Term 11/19/17    F CS-LTranv   LIV     Complications: Fetal Intolerance    Past Medical History:  Diagnosis Date  . Asthma   . Diabetes mellitus   . Hypertension   . MI (myocardial infarction) (HCC)   . Neuropathy     Past Surgical History:  Procedure Laterality Date  . ADENOIDECTOMY    . APPENDECTOMY    . DILATION AND CURETTAGE OF UTERUS    . OTHER SURGICAL HISTORY     sweat gland excision  . TONSILLECTOMY      Current Outpatient Medications on File Prior to Visit  Medication Sig Dispense Refill  . aspirin 81 MG chewable tablet Chew 81 mg by mouth daily.    Marland Kitchen EPINEPHrine 0.3 mg/0.3 mL IJ SOAJ injection Inject into the muscle.    . Prenatal Vit-Fe Fumarate-FA (PRENATAL VITAMIN) 27-0.8 MG TABS Take by mouth daily.     Marland Kitchen propylthiouracil (PTU) 50 MG tablet Take 50 mg by mouth 3 (three) times daily.     No current facility-administered medications on file prior to visit.     Allergies  Allergen Reactions  . Darvocet [Propoxyphene N-Acetaminophen] Anaphylaxis    Also makes her stomach hurt  . Peanut-Containing Drug Products Shortness Of Breath and Swelling  . Penicillins Shortness Of Breath and Swelling  . Cephalexin Hives and Itching  . Tramadol Nausea And Vomiting  . Toradol [Ketorolac Tromethamine] Rash    Red rash with bumps    Social History   Socioeconomic History  . Marital status: Single    Spouse name: Not on file  . Number of children: Not on file  . Years of education: Not on file  . Highest education level: Not on file  Occupational History  . Not on file  Social Needs  . Financial resource strain: Not on file  . Food insecurity:    Worry: Not on file    Inability: Not on file  . Transportation needs:    Medical: Not on file    Non-medical: Not on file  Tobacco Use  . Smoking status: Former Smoker    Packs/day: 1.00    Years: 10.00    Pack years: 10.00  . Smokeless tobacco: Never Used  Substance and Sexual Activity  .  Alcohol use: No  . Drug use: Yes    Types: Marijuana  . Sexual activity: Yes  Lifestyle  . Physical activity:    Days per week: Not on file    Minutes per session: Not on file  . Stress: Not on file  Relationships  . Social connections:    Talks on phone: Not on file    Gets together: Not on file    Attends religious service: Not on file    Active member of club or organization: Not on file    Attends meetings of clubs or organizations: Not on file    Relationship status: Not on file  . Intimate partner violence:    Fear of current or ex partner: Not on file    Emotionally abused: Not on file    Physically abused: Not on file    Forced sexual activity: Not on file  Other Topics Concern  . Not on file  Social History Narrative  . Not on  file    Family History  Problem Relation Age of Onset  . Hypertension Mother   . Hypertension Father     The following portions of the patient's history were reviewed and updated as appropriate: allergies, current medications, past OB history, past medical history, past surgical history, past family history, past social history, and problem list.    OBJECTIVE:  BP (!) 132/100   Pulse (!) 130   Wt 180 lb 4.8 oz (81.8 kg)   LMP 08/15/2018 (Exact Date)   BMI 28.24 kg/m   Initial Physical Exam (New OB)  GENERAL APPEARANCE: alert, well appearing, in no apparent distress  HEAD: normocephalic, atraumatic  MOUTH: mucous membranes moist, pharynx normal without lesions  THYROID: no thyromegaly or masses present  BREASTS: deferred, no complaints  LUNGS: clear to auscultation, no wheezes, rales or rhonchi, symmetric air entry  HEART: regular rate and rhythm, no murmurs  ABDOMEN: soft, nontender, nondistended, no abnormal masses, no epigastric pain and FHT not heard  EXTREMITIES: no redness or tenderness in the calves or thighs, no edema  SKIN: normal coloration and turgor, no rashes  NEUROLOGIC: alert, oriented, normal speech, no focal findings or movement disorder noted  PELVIC EXAM: not indicated  ULTRASOUND REPORT  Location: [facility] Date of Service:  10/30/2018  Indications: Dating/Viability Findings:  Mason Jim intrauterine pregnancy is visualized with a CRL consistent with 10 1/[redacted] weeks gestation, giving an (U/S) EDD of 05/27/19. The (U/S) EDD is consistent with the clinically established (LMP) EDD of 05/22/19.  FHR: 178 BPM CRL measurement: 32.3 mm Yolk sac and early anatomy is normal.  Right Ovary measures 2.8 x 2.1 x 2.0 cm. It is normal in appearance. Left Ovary measures 2.6 x 2.1 x 1.7 cm. It is normal appearance. There is no obvious evidence of a corpus luteal cyst. Survey of the adnexa demonstrates no adnexal masses. There is no free  peritoneal fluid in the cul de sac.  Impression: 1. 10 1/7 week Viable Singleton Intrauterine pregnancy by U/S. 2. (U/S) EDD is consistent with Clinically established (LMP) EDD of 05/22/19.  Recommendations: 1.Clinical correlation with the patient's History and Physical Exam.  ASSESSMENT: Normal pregnancy History asthma History diabetes History thyroid disease Previous cesarean section-desires TOLAC Desires genetic screening Back pain/cramping in pregnancy  PLAN: Rx: Labetalol and Diclegis, see orders New OB labs today Prenatal care New OB counseling: The patient has been given an overview regarding routine prenatal care. Recommendations regarding diet, weight gain, and exercise in pregnancy were  given. Prenatal testing, optional genetic testing, and ultrasound use in pregnancy were reviewed.  Benefits of Breast Feeding were discussed. The patient is encouraged to consider nursing her baby post partum. See orders

## 2018-10-30 NOTE — Patient Instructions (Addendum)
Labetalol tablets What is this medicine? LABETALOL (la BET a lole) is a beta-blocker. Beta-blockers reduce the workload on the heart and help it to beat more regularly. This medicine is used to treat high blood pressure. This medicine may be used for other purposes; ask your health care provider or pharmacist if you have questions. COMMON BRAND NAME(S): Normodyne, Trandate What should I tell my health care provider before I take this medicine? They need to know if you have any of these conditions: -diabetes -history of heart attack, heart disease or heart failure -kidney disease -liver disease -lung or breathing disease, like asthma or emphysema -pheochromocytoma -thyroid disease -an unusual or allergic reaction to labetalol, other beta-blockers, medicines, foods, dyes, or preservatives -pregnant or trying to get pregnant -breast-feeding How should I use this medicine? Take this medicine by mouth with a glass of water. Follow the directions on the prescription label. Take your doses at regular intervals. Do not take your medicine more often than directed. Do not stop taking this medicine suddenly. This could lead to serious heart-related effects. Talk to your pediatrician regarding the use of this medicine in children. Special care may be needed. Overdosage: If you think you have taken too much of this medicine contact a poison control center or emergency room at once. NOTE: This medicine is only for you. Do not share this medicine with others. What if I miss a dose? If you miss a dose, take it as soon as you can. If it is almost time for your next dose, take only that dose. Do not take double or extra doses. What may interact with this medicine? This medicine also interact with the following medications: -certain medicines for blood pressure, heart disease, irregular heart beat -cimetidine -general anesthetics -medicines for asthma or lung disease like albuterol -medicines for  depression -nitroglycerin This list may not describe all possible interactions. Give your health care provider a list of all the medicines, herbs, non-prescription drugs, or dietary supplements you use. Also tell them if you smoke, drink alcohol, or use illegal drugs. Some items may interact with your medicine. What should I watch for while using this medicine? Visit your doctor or health care professional for regular check ups. Check your blood pressure and pulse rate regularly. Ask your health care professional what your blood pressure and pulse rate should be, and when you should contact him or her. You may get drowsy or dizzy. Do not drive, use machinery, or do anything that needs mental alertness until you know how this medicine affects you. Do not stand or sit up quickly. Alcohol may interfere with the effect of this medicine. Avoid alcoholic drinks. This medicine can affect blood sugar levels. If you have diabetes, check with your doctor or health care professional before you change your diet or the dose of your diabetic medicine. Do not treat yourself for coughs, colds, or pain while you are taking this medicine without asking your doctor or health care professional for advice. Some ingredients may increase your blood pressure. What side effects may I notice from receiving this medicine? Side effects that you should report to your doctor or health care professional as soon as possible: -allergic reactions like skin rash, itching or hives, swelling of the face, lips, or tongue -breathing problems -cold hands or feet -dark urine -depression -general ill feeling or flu-like symptoms -irregular heartbeat -light-colored stools -loss of appetite, nausea -pain or trouble passing urine -right upper belly pain -slow heart rate (fewer than recommended by your  doctor or health care professional) -swollen legs or ankles -tingling of the scalp or skin -unusually weak or  tired -vomiting -yellowing of the eyes or skin Side effects that usually do not require medical attention (report to your doctor or health care professional if they continue or are bothersome): -decreased sexual function or desire -dry itching skin -headache -tiredness This list may not describe all possible side effects. Call your doctor for medical advice about side effects. You may report side effects to FDA at 1-800-FDA-1088. Where should I keep my medicine? Keep out of the reach of children. Store at room temperature between 15 and 30 degrees C (59 and 86 degrees F). Protect from light. Keep container tightly closed. Throw away any unused medicine after the expiration date. NOTE: This sheet is a summary. It may not cover all possible information. If you have questions about this medicine, talk to your doctor, pharmacist, or health care provider.  2018 Elsevier/Gold Standard (2013-08-21 14:34:23) Eating Plan for Pregnant Women While you are pregnant, your body will require additional nutrition to help support your growing baby. It is recommended that you consume:  150 additional calories each day during your first trimester.  300 additional calories each day during your second trimester.  300 additional calories each day during your third trimester.  Eating a healthy, well-balanced diet is very important for your health and for your baby's health. You also have a higher need for some vitamins and minerals, such as folic acid, calcium, iron, and vitamin D. What do I need to know about eating during pregnancy?  Do not try to lose weight or go on a diet during pregnancy.  Choose healthy, nutritious foods. Choose  of a sandwich with a glass of milk instead of a candy bar or a high-calorie sugar-sweetened beverage.  Limit your overall intake of foods that have "empty calories." These are foods that have little nutritional value, such as sweets, desserts, candies, sugar-sweetened  beverages, and fried foods.  Eat a variety of foods, especially fruits and vegetables.  Take a prenatal vitamin to help meet the additional needs during pregnancy, specifically for folic acid, iron, calcium, and vitamin D.  Remember to stay active. Ask your health care provider for exercise recommendations that are specific to you.  Practice good food safety and cleanliness, such as washing your hands before you eat and after you prepare raw meat. This helps to prevent foodborne illnesses, such as listeriosis, that can be very dangerous for your baby. Ask your health care provider for more information about listeriosis. What does 150 extra calories look like? Healthy options for an additional 150 calories each day could be any of the following:  Plain low-fat yogurt (6-8 oz) with  cup of berries.  1 apple with 2 teaspoons of peanut butter.  Cut-up vegetables with  cup of hummus.  Low-fat chocolate milk (8 oz or 1 cup).  1 string cheese with 1 medium orange.   of a peanut butter and jelly sandwich on whole-wheat bread (1 tsp of peanut butter).  For 300 calories, you could eat two of those healthy options each day. What is a healthy amount of weight to gain? The recommended amount of weight for you to gain is based on your pre-pregnancy BMI. If your pre-pregnancy BMI was:  Less than 18 (underweight), you should gain 28-40 lb.  18-24.9 (normal), you should gain 25-35 lb.  25-29.9 (overweight), you should gain 15-25 lb.  Greater than 30 (obese), you should gain 11-20 lb.  What if I am having twins or multiples? Generally, pregnant women who will be having twins or multiples may need to increase their daily calories by 300-600 calories each day. The recommended range for total weight gain is 25-54 lb, depending on your pre-pregnancy BMI. Talk with your health care provider for specific guidance about additional nutritional needs, weight gain, and exercise during your  pregnancy. What foods can I eat? Grains Any grains. Try to choose whole grains, such as whole-wheat bread, oatmeal, or brown rice. Vegetables Any vegetables. Try to eat a variety of colors and types of vegetables to get a full range of vitamins and minerals. Remember to wash your vegetables well before eating. Fruits Any fruits. Try to eat a variety of colors and types of fruit to get a full range of vitamins and minerals. Remember to wash your fruits well before eating. Meats and Other Protein Sources Lean meats, including chicken, [place], fish, and lean cuts of beef, veal, or pork. Make sure that all meats are cooked to "well done." Tofu. Tempeh. Beans. Eggs. Peanut butter and other nut butters. Seafood, such as shrimp, crab, and lobster. If you choose fish, select types that are higher in omega-3 fatty acids, including salmon, herring, mussels, trout, sardines, and pollock. Make sure that all meats are cooked to food-safe temperatures. Dairy Pasteurized milk and milk alternatives. Pasteurized yogurt and pasteurized cheese. Cottage cheese. Sour cream. Beverages Water. Juices that contain 100% fruit juice or vegetable juice. Caffeine-free teas and decaffeinated coffee. Drinks that contain caffeine are okay to drink, but it is better to avoid caffeine. Keep your total caffeine intake to less than 200 mg each day (12 oz of coffee, tea, or soda) or as directed by your health care provider. Condiments Any pasteurized condiments. Sweets and Desserts Any sweets and desserts. Fats and Oils Any fats and oils. The items listed above may not be a complete list of recommended foods or beverages. Contact your dietitian for more options. What foods are not recommended? Vegetables Unpasteurized (raw) vegetable juices. Fruits Unpasteurized (raw) fruit juices. Meats and Other Protein Sources Cured meats that have nitrates, such as bacon, salami, and hotdogs. Luncheon meats, bologna, or other deli meats  (unless they are reheated until they are steaming hot). Refrigerated pate, meat spreads from a meat counter, smoked seafood that is found in the refrigerated section of a store. Raw fish, such as sushi or sashimi. High mercury content fish, such as tilefish, shark, swordfish, and king mackerel. Raw meats, such as tuna or beef tartare. Undercooked meats and poultry. Make sure that all meats are cooked to food-safe temperatures. Dairy Unpasteurized (raw) milk and any foods that have raw milk in them. Soft cheeses, such as feta, queso blanco, queso fresco, Brie, Camembert cheeses, blue-veined cheeses, and Panela cheese (unless it is made with pasteurized milk, which must be stated on the label). Beverages Alcohol. Sugar-sweetened beverages, such as sodas, teas, or energy drinks. Condiments Homemade fermented foods and drinks, such as pickles, sauerkraut, or kombucha drinks. (Store-bought pasteurized versions of these are okay.) Other Salads that are made in the store, such as ham salad, chicken salad, egg salad, tuna salad, and seafood salad. The items listed above may not be a complete list of foods and beverages to avoid. Contact your dietitian for more information. This information is not intended to replace advice given to you by your health care provider. Make sure you discuss any questions you have with your health care provider. Document Released: 10/01/2014 Document Revised: 05/24/2016  Document Reviewed: 06/01/2014 Elsevier Interactive Patient Education  2018 [employer]. Hypertension During Pregnancy Hypertension is also called high blood pressure. High blood pressure means that the force of your blood moving in your body is too strong. When you are pregnant, this condition should be watched carefully. It can cause problems for you and your baby. Follow these instructions at home: Eating and drinking  Drink enough fluid to keep your pee (urine) clear or pale yellow.  Eat healthy foods  that are low in salt (sodium). ? Do not add salt to your food. ? Check labels on foods and drinks to see much salt is in them. Look on the label where you see "Sodium." Lifestyle  Do not use any products that contain nicotine or tobacco, such as cigarettes and e-cigarettes. If you need help quitting, ask your doctor.  Do not use alcohol.  Avoid caffeine.  Avoid stress. Rest and get plenty of sleep. General instructions  Take over-the-counter and prescription medicines only as told by your doctor.  While lying down, lie on your left side. This keeps pressure off your baby.  While sitting or lying down, raise (elevate) your feet. Try putting some pillows under your lower legs.  Exercise regularly. Ask your doctor what kinds of exercise are best for you.  Keep all prenatal and follow-up visits as told by your doctor. This is important. Contact a doctor if:  You have symptoms that your doctor told you to watch for, such as: ? Fever. ? Throwing up (vomiting). ? Headache. Get help right away if:  You have very bad pain in your belly (abdomen).  You are throwing up, and this does not get better with treatment.  You suddenly get swelling in your hands, ankles, or face.  You gain 4 lb (1.8 kg) or more in 1 week.  You get bleeding from your vagina.  You have blood in your pee.  You do not feel your baby moving as much as normal.  You have a change in vision.  You have muscle twitching or sudden tightening (spasms).  You have trouble breathing.  Your lips or fingernails turn blue. This information is not intended to replace advice given to you by your health care provider. Make sure you discuss any questions you have with your health care provider. Document Released: 01/19/2011 Document Revised: 08/28/2016 Document Reviewed: 08/28/2016 Elsevier Interactive Patient Education  2018 [employer]. Common Medications Safe in Pregnancy  Acne:      Constipation:  Benzoyl  Peroxide     Colace  Clindamycin      Dulcolax Suppository  Topica Erythromycin     Fibercon  Salicylic Acid      Metamucil         Miralax AVOID:        Senakot   Accutane    Cough:  Retin-A       Cough Drops  Tetracycline      Phenergan w/ Codeine if Rx  Minocycline      Robitussin (Plain & DM)  Antibiotics:     Crabs/Lice:  Ceclor       RID  Cephalosporins    AVOID:  E-Mycins      Kwell  Keflex  Macrobid/Macrodantin   Diarrhea:  Penicillin      Kao-Pectate  Zithromax      Imodium AD         PUSH FLUIDS AVOID:       Cipro     Fever:  Tetracycline  Tylenol (Regular or Extra  Minocycline       Strength)  Levaquin      Extra Strength-Do not          Exceed 8 tabs/24 hrs Caffeine:        <222m/day (equiv. To 1 cup of coffee or  approx. 3 12 oz sodas)         Gas: Cold/Hayfever:       Gas-X  Benadryl      Mylicon  Claritin       Phazyme  **Claritin-D        Chlor-Trimeton    Headaches:  Dimetapp      ASA-Free Excedrin  Drixoral-Non-Drowsy     Cold Compress  Mucinex (Guaifenasin)     Tylenol (Regular or Extra  Sudafed/Sudafed-12 Hour     Strength)  **Sudafed PE Pseudoephedrine   Tylenol Cold & Sinus     Vicks Vapor Rub  Zyrtec  **AVOID if Problems With Blood Pressure         Heartburn: Avoid lying down for at least 1 hour after meals  Aciphex      Maalox     Rash:  Milk of Magnesia     Benadryl    Mylanta       1% Hydrocortisone Cream  Pepcid  Pepcid Complete   Sleep Aids:  Prevacid      Ambien   Prilosec       Benadryl  Rolaids       Chamomile Tea  Tums (Limit 4/day)     Unisom  Zantac       Tylenol PM         Warm milk-add vanilla or  Hemorrhoids:       Sugar for taste  Anusol/Anusol H.C.  (RX: Analapram 2.5%)  Sugar Substitutes:  Hydrocortisone OTC     Ok in moderation  Preparation H      Tucks        Vaseline lotion applied to tissue with wiping    Herpes:     Throat:  Acyclovir      Oragel  Famvir  Valtrex     Vaccines:         Flu  Shot Leg Cramps:       *Gardasil  Benadryl      Hepatitis A         Hepatitis B Nasal Spray:       Pneumovax  Saline Nasal Spray     Polio Booster         Tetanus Nausea:       Tuberculosis test or PPD  Vitamin B6 25 mg TID   AVOID:    Dramamine      *Gardasil  Emetrol       Live Poliovirus  Ginger Root 250 mg QID    MMR (measles, mumps &  High Complex Carbs @ Bedtime    rebella)  Sea Bands-Accupressure    Varicella (Chickenpox)  Unisom 1/2 tab TID     *No known complications           If received before Pain:         Known pregnancy;   Darvocet       Resume series after  Lortab        Delivery  Percocet    Yeast:   Tramadol      Femstat  Tylenol 3      Gyne-lotrimin  Ultram       Monistat  Vicodin           MISC:         All Sunscreens           Hair Coloring/highlights          Insect Repellant's          (Including DEET)         Mystic Tans Second Trimester of Pregnancy The second trimester is from week 13 through week 28, month 4 through 6. This is often the time in pregnancy that you feel your best. Often times, morning sickness has lessened or quit. You may have more energy, and you may get hungry more often. Your unborn baby (fetus) is growing rapidly. At the end of the sixth month, he or she is about 9 inches long and weighs about 1 pounds. You will likely feel the baby move (quickening) between 18 and 20 weeks of pregnancy. Follow these instructions at home:  Avoid all smoking, herbs, and alcohol. Avoid drugs not approved by your doctor.  Do not use any tobacco products, including cigarettes, chewing tobacco, and electronic cigarettes. If you need help quitting, ask your doctor. You may get counseling or other support to help you quit.  Only take medicine as told by your doctor. Some medicines are safe and some are not during pregnancy.  Exercise only as told by your doctor. Stop exercising if you start having cramps.  Eat regular, healthy meals.  Wear a good  support bra if your breasts are tender.  Do not use hot tubs, steam rooms, or saunas.  Wear your seat belt when driving.  Avoid raw meat, uncooked cheese, and liter boxes and soil used by cats.  Take your prenatal vitamins.  Take 1500-2000 milligrams of calcium daily starting at the 20th week of pregnancy until you deliver your baby.  Try taking medicine that helps you poop (stool softener) as needed, and if your doctor approves. Eat more fiber by eating fresh fruit, vegetables, and whole grains. Drink enough fluids to keep your pee (urine) clear or pale yellow.  Take warm water baths (sitz baths) to soothe pain or discomfort caused by hemorrhoids. Use hemorrhoid cream if your doctor approves.  If you have puffy, bulging veins (varicose veins), wear support hose. Raise (elevate) your feet for 15 minutes, 3-4 times a day. Limit salt in your diet.  Avoid heavy lifting, wear low heals, and sit up straight.  Rest with your legs raised if you have leg cramps or low back pain.  Visit your dentist if you have not gone during your pregnancy. Use a soft toothbrush to brush your teeth. Be gentle when you floss.  You can have sex (intercourse) unless your doctor tells you not to.  Go to your doctor visits. Get help if:  You feel dizzy.  You have mild cramps or pressure in your lower belly (abdomen).  You have a nagging pain in your belly area.  You continue to feel sick to your stomach (nauseous), throw up (vomit), or have watery poop (diarrhea).  You have bad smelling fluid coming from your vagina.  You have pain with peeing (urination). Get help right away if:  You have a fever.  You are leaking fluid from your vagina.  You have spotting or bleeding from your vagina.  You have severe belly cramping or pain.  You lose or gain weight rapidly.  You have trouble catching your breath and have chest pain.  You notice sudden or extreme puffiness (  swelling) of your face, hands,  ankles, feet, or legs.  You have not felt the baby move in over an hour.  You have severe headaches that do not go away with medicine.  You have vision changes. This information is not intended to replace advice given to you by your health care provider. Make sure you discuss any questions you have with your health care provider. Document Released: 03/13/2010 Document Revised: 05/24/2016 Document Reviewed: 02/17/2013 Elsevier Interactive Patient Education  2017 [place] After Cesarean Delivery A trial of labor after cesarean delivery (TOLAC) is when a woman tries to give birth vaginally after a previous cesarean delivery. TOLAC may be a safe and appropriate option for you depending on your medical history and other risk factors. When TOLAC is successful and you are able to have a vaginal delivery, this is called a vaginal birth after cesarean delivery (VBAC). Candidates for TOLAC TOLAC is possible for some women who:  Have undergone one or two prior cesarean deliveries in which the incision of the uterus was horizontal (low transverse).  Are carrying twins and have had one prior low transverse incision during a cesarean delivery.  Do not have a vertical (classical) uterine scar.  Have not had a tear in the wall of their uterus (uterine rupture).  TOLAC is also supported for women who meet appropriate criteria and:  Are under the age of 93 years.  Are tall and have a body mass index (BMI) of less than 30.  Have an unknown uterine scar.  Give birth in a facility equipped to handle an emergency cesarean delivery. This team should be able to handle possible complications such as a uterine rupture.  Have thorough counseling about the benefits and risks of TOLAC.  Have discussed future pregnancy plans with their health care provider.  Plan to have several more pregnancies.  Most successful candidates for TOLAC:  Have had a successful vaginal delivery before or  after their cesarean delivery.  Experience labor that begins naturally on or before the due date (40 weeks of gestation).  Do not have a very large (macrosomic) baby.  Had a prior cesarean delivery but are not currently experiencing factors that would prompt a cesarean delivery (such as a breech position).  Had only one prior cesarean delivery.  Had a prior cesarean delivery that was performed early in labor and not after full cervical dilation. TOLAC may be most appropriate for women who meet the above guidelines and who plan to have more pregnancies. TOLAC is not recommended for home births. Least successful candidates for TOLAC:  Have an induced labor with an unfavorable cervix. An unfavorable cervix is when the cervix is not dilating enough (among other factors).  Have never had a vaginal delivery.  Have had more than two cesarean deliveries.  Have a pregnancy at more than 40 weeks of gestation.  Are pregnant with a baby with a suspected weight greater than 4,000 grams (8 pounds) and who have no prior history of a vaginal delivery.  Have closely spaced pregnancies. Suggested benefits of TOLAC  You may have a faster recovery time.  You may have a shorter stay in the hospital.  You may have less pain and fewer problems than with a cesarean delivery. Women who have a cesarean delivery have a higher chance of needing blood or getting a fever, an infection, or a blood clot in the legs. Suggested risks of TOLAC The highest risk of complications happens to women who attempt  a TOLAC and fail. A failed TOLAC results in an unplanned cesarean delivery. Risks related to [facility] or repeat cesarean deliveries include:  Blood loss.  Infection.  Blood clot.  Injury to surrounding tissues or organs.  Having to remove the uterus (hysterectomy).  Potential problems with the placenta (such as placenta previa or placenta accreta) in future pregnancies.  Although very rare, the main  concerns with TOLAC are:  Rupture of the uterine scar from a past cesarean delivery.  Needing an emergency cesarean delivery.  Having a bad outcome for the baby (perinatal morbidity).  Where to find more information:  [employer] of [employer]: www.acog.[place]: www.midwife.org This information is not intended to replace advice given to you by your health care provider. Make sure you discuss any questions you have with your health care provider. Document Released: 09/04/2011 Document Revised: 11/14/2016 Document Reviewed: 06/08/2013 Elsevier Interactive Patient Education  [employer].

## 2018-10-31 LAB — URINALYSIS, ROUTINE W REFLEX MICROSCOPIC
BILIRUBIN UA: NEGATIVE
GLUCOSE, UA: NEGATIVE
LEUKOCYTES UA: NEGATIVE
Nitrite, UA: NEGATIVE
RBC UA: NEGATIVE
Specific Gravity, UA: 1.029 (ref 1.005–1.030)
UUROB: 1 mg/dL (ref 0.2–1.0)
pH, UA: 5 (ref 5.0–7.5)

## 2018-10-31 LAB — ABO AND RH: RH TYPE: POSITIVE

## 2018-10-31 LAB — ANTIBODY SCREEN: Antibody Screen: NEGATIVE

## 2018-10-31 LAB — VARICELLA ZOSTER ANTIBODY, IGG: Varicella zoster IgG: 1744 index (ref >165)

## 2018-10-31 LAB — HIV ANTIBODY (ROUTINE TESTING W REFLEX): HIV Screen 4th Generation wRfx: NONREACTIVE

## 2018-10-31 LAB — RPR: RPR: NONREACTIVE

## 2018-10-31 LAB — SICKLE CELL SCREEN: SICKLE CELL SCREEN: NEGATIVE

## 2018-10-31 LAB — HEPATITIS B SURFACE ANTIGEN: Hepatitis B Surface Ag: NEGATIVE

## 2018-10-31 LAB — RUBELLA SCREEN: RUBELLA: 11 {index} (ref >0.99)

## 2018-11-01 LAB — GC/CHLAMYDIA PROBE AMP
Chlamydia trachomatis, NAA: NEGATIVE
NEISSERIA GONORRHOEAE BY PCR: NEGATIVE

## 2018-11-02 LAB — URINE CULTURE, OB REFLEX

## 2018-11-02 LAB — CULTURE, OB URINE

## 2018-11-06 ENCOUNTER — Encounter: Payer: Medicaid Other | Admitting: Obstetrics and Gynecology

## 2018-11-06 LAB — MATERNIT 21 PLUS CORE, BLOOD
CHROMOSOME 18: NEGATIVE
CHROMOSOME 21: NEGATIVE
Chromosome 13: NEGATIVE
Y CHROMOSOME: DETECTED

## 2018-11-07 ENCOUNTER — Telehealth: Payer: Self-pay | Admitting: Certified Nurse Midwife

## 2018-11-07 NOTE — Telephone Encounter (Signed)
Jola Babinski aware patient returned call.  Jola Babinski will try to reach patient again.

## 2018-11-07 NOTE — Telephone Encounter (Signed)
He patient called and stated that she missed a call from [place] and Michelle Osborn and would like a call back today if possible, No other in formation was disclosed. Please advise.

## 2018-11-07 NOTE — Telephone Encounter (Signed)
[id number] Telephone call to patient, verified full name and date of birth.   Patient aware she missed her appointment with blood pressure check yesterday. Checked blood pressure at [employer] and was approximately 160/98.   Encouraged patient to come to office today for blood pressure check.   Unable to come for appointment today due to multiple deaths in her family and traveling to [place] in hour.   Discussed travel precautions in pregnancy. Reviewed red flag symptoms and when to call.   RTC on Monday for blood pressure check and visit with Dr. Logan Bores. Patient placed on hold and transferred to front desk to schedule appointment.   Dr. Logan Bores notified of patient appointment and multiple co-morbidities needing MD care and follow up during pregnancy.    Gunnar Bulla, CNM [facility], [facility]

## 2018-11-11 ENCOUNTER — Encounter: Payer: Medicaid Other | Admitting: Obstetrics and Gynecology

## 2018-11-12 ENCOUNTER — Telehealth: Payer: Self-pay

## 2018-11-12 NOTE — Telephone Encounter (Signed)
CM made tc with member due to receiving a call from referral coordinator at [facility] informing this cm that member called the OB office regarding her blood pressure reading of 225/138 and having a "migraine for 1 week."  In the phone conversation with member, she reported that she was experiencing "migraines since Sunday morning" which would not go away when taking tylenol.  Member reports that she has been taking her Bonjesta and her labetotol as prescribed by her provider.  Member took her blood pressure while this cm was on the phone with her at 11:20 am and per member, the blood pressure reading stated 247/188.  Member reports that she took her blood pressure with a wrist cuff and used her right wrist.  Due to concerning blood pressure reading and member's problem list, cm assessed transportation situation to get to the nearest Emergency Room.  Member reports that currently, she did not have any transportation to go to Emergency Room or OB Provider's if needed due to relying on her brother to provide her transportation, and that her brother was currently "at work."  CM encouraged member to contact 911 to be assessed.  Member agreeable and and contacted 911 at 11:33 am, and cm stayed on the phone with member until member reported  that the first responders arrived at her mother's house in [place] at 11:38 am.  CM encouraged member to contact this cm for follow up and to assist with scheduling an OB appointment.  CM also encouraged member to follow plan of care by the first responders.  Member agreeable.  CM will continue to f/u with member. Call ended at 11:39 AM.

## 2018-11-21 ENCOUNTER — Encounter: Payer: Self-pay | Admitting: Obstetrics and Gynecology

## 2018-11-21 ENCOUNTER — Ambulatory Visit (INDEPENDENT_AMBULATORY_CARE_PROVIDER_SITE_OTHER): Payer: Medicaid Other | Admitting: Obstetrics and Gynecology

## 2018-11-21 VITALS — BP 137/84 | HR 123 | Ht 67.0 in | Wt 179.6 lb

## 2018-11-21 DIAGNOSIS — O10919 Unspecified pre-existing hypertension complicating pregnancy, unspecified trimester: Secondary | ICD-10-CM

## 2018-11-21 DIAGNOSIS — Z8639 Personal history of other endocrine, nutritional and metabolic disease: Secondary | ICD-10-CM

## 2018-11-21 DIAGNOSIS — R102 Pelvic and perineal pain: Secondary | ICD-10-CM

## 2018-11-21 DIAGNOSIS — O219 Vomiting of pregnancy, unspecified: Secondary | ICD-10-CM | POA: Diagnosis not present

## 2018-11-21 LAB — POCT URINALYSIS DIPSTICK
BILIRUBIN UA: NEGATIVE
Blood, UA: NEGATIVE
GLUCOSE UA: NEGATIVE
Leukocytes, UA: NEGATIVE
Nitrite, UA: NEGATIVE
PH UA: 5 (ref 5.0–8.0)
Protein, UA: POSITIVE — AB
Spec Grav, UA: >=1.03 — AB (ref 1.010–1.025)
UROBILINOGEN UA: NEGATIVE U/dL — AB

## 2018-11-21 NOTE — Progress Notes (Signed)
HPI:      [name] is a 32 y.o. G2P1001 who LMP was Patient's last menstrual period was 08/15/2018 (exact date).  Subjective:   She presents today for a blood pressure check but her visit became complicated because she complained of pelvic "twinges" and daily nausea and vomiting.  She does report keeping some food down and reports that Michelle Osborn has been helping her.  She has been taking her labetalol as directed. Of significant note patient has a history of diabetes and is currently not on any medication. She has had a previous cesarean delivery and has not yet decided upon vaginal versus repeat cesarean. She is currently on PTU for hyperthyroidism. She will be meeting with Michelle Osborn today regarding her current living situation and previous depression/anxiety issues.    Hx: The following portions of the patient's history were reviewed and updated as appropriate:             She  has a past medical history of Asthma, Diabetes mellitus, Hypertension, MI (myocardial infarction) (HCC), and Neuropathy. She does not have any pertinent problems on file. She  has a past surgical history that includes Appendectomy; Tonsillectomy; Adenoidectomy; Other surgical history; and Dilation and curettage of uterus. Her family history includes Hypertension in her father and mother. She  reports that she has been smoking. She has a 10.00 pack-year smoking history. She has never used smokeless tobacco. She reports that she has current or past drug history. Drug: Marijuana. She reports that she does not drink alcohol. She has a current medication list which includes the following prescription(s): aspirin, doxylamine-pyridoxine, epinephrine, labetalol, prenatal vitamin, and propylthiouracil. She is allergic to darvocet [propoxyphene n-acetaminophen]; peanut-containing drug products; penicillins; cephalexin; tramadol; and toradol [ketorolac tromethamine].       Review of Systems:  Review of  Systems  Constitutional: Denied constitutional symptoms, night sweats, recent illness, fatigue, fever, insomnia and weight loss.  Eyes: Denied eye symptoms, eye pain, photophobia, vision change and visual disturbance.  Ears/Nose/Throat/Neck: Denied ear, nose, throat or neck symptoms, hearing loss, nasal discharge, sinus congestion and sore throat.  Cardiovascular: Denied cardiovascular symptoms, arrhythmia, chest pain/pressure, edema, exercise intolerance, orthopnea and palpitations.  Respiratory: Denied pulmonary symptoms, asthma, pleuritic pain, productive sputum, cough, dyspnea and wheezing.  Gastrointestinal: Denied, gastro-esophageal reflux, melena, nausea and vomiting.  Genitourinary: Denied genitourinary symptoms including symptomatic vaginal discharge, pelvic relaxation issues, and urinary complaints.  Musculoskeletal: Denied musculoskeletal symptoms, stiffness, swelling, muscle weakness and myalgia.  Dermatologic: Denied dermatology symptoms, rash and scar.  Neurologic: Denied neurology symptoms, dizziness, headache, neck pain and syncope.  Psychiatric: Denied psychiatric symptoms, anxiety and depression.  Endocrine: Denied endocrine symptoms including hot flashes and night sweats.   Meds:   Current Outpatient Medications on File Prior to Visit  Medication Sig Dispense Refill  . aspirin 81 MG chewable tablet Chew 81 mg by mouth daily.    . Doxylamine-Pyridoxine (DICLEGIS) 10-10 MG TBEC Take 2 tablets by mouth at bedtime. If symptoms persist, add one tablet in the morning and one in the afternoon 100 tablet 5  . EPINEPHrine 0.3 mg/0.3 mL IJ SOAJ injection Inject into the muscle.    . labetalol (NORMODYNE) 100 MG tablet Take 1 tablet (100 mg total) by mouth 2 (two) times daily. 60 tablet 1  . Prenatal Vit-Fe Fumarate-FA (PRENATAL VITAMIN) 27-0.8 MG TABS Take by mouth daily.    Marland Kitchen propylthiouracil (PTU) 50 MG tablet Take 50 mg by mouth 3 (three) times daily.     No current  facility-administered medications on file prior to visit.     Objective:     Vitals:   11/21/18 0835  BP: 137/84  Pulse: (!) 123   Positive fetal heart tones-146.            Urinalysis reveals small amount of ketones but no evidence of UTI.  Assessment:    G2P1001 Patient Active Problem List   Diagnosis Date Noted  . History of thyroid disease 10/30/2018  . History of cesarean section 10/30/2018  . Nausea/vomiting in pregnancy 10/30/2018  . History of marijuana use 10/30/2018  . HYPERCHOLESTEROLEMIA 12/08/2009  . MORBID OBESITY 12/08/2009  . HIDRADENITIS SUPPURATIVA 12/08/2009  . VITAMIN D DEFICIENCY 11/18/2009  . GERD 11/18/2009  . BACTERIAL VAGINITIS 11/18/2009  . MENORRHAGIA 11/18/2009  . DIABETES MELLITUS, TYPE II 11/17/2009  . OBESITY 11/17/2009  . HYPERTENSION 11/17/2009  . ASTHMA 11/17/2009  . VAGINAL DISCHARGE 11/17/2009     1. Nausea/vomiting in pregnancy   2. History of diabetes mellitus   3. History of thyroid disease   4. Chronic hypertension affecting pregnancy     Blood pressure currently controlled using labetalol.  Nausea and vomiting but not severe.  She is keeping food and liquids down on a daily basis. (Patient states that with her previous pregnancy she had to have a PICC line placed)   Plan:            1.  Dietary management of nausea and vomiting discussed in detail.  Symptomatic relief discussed.  2.  Hemoglobin A1c  3.  Thyroid function labs- patient will likely need to see endocrinologist soon.  4.  Gender reveal today patient and partner very excited with female.  5.  Discussed VBAC in detail-questions answered.  6.  Follow-up in 2 weeks for AFP and new OB physical.  Orders No orders of the defined types were placed in this encounter.   No orders of the defined types were placed in this encounter.     F/U  No follow-ups on file. I spent 29 minutes involved in the care of this patient of which greater than 50% was spent discussing  discussing diabetes, chronic hypertension, mode and timing of delivery, induction with previous C-section, PTU in pregnancy, gender reveal, nausea and vomiting of pregnancy.  Elonda Husky, M.D. 11/21/2018 9:43 AM

## 2018-11-22 LAB — HEMOGLOBIN A1C
ESTIMATED AVERAGE GLUCOSE: 91 mg/dL
HEMOGLOBIN A1C: 4.8 % (ref 4.8–5.6)

## 2018-11-22 LAB — THYROID PANEL WITH TSH
Free Thyroxine Index: 7.3 — ABNORMAL HIGH (ref 1.2–4.9)
T3 UPTAKE RATIO: 36 % (ref 24–39)
T4 TOTAL: 20.3 ug/dL — AB (ref 4.5–12.0)

## 2018-12-05 ENCOUNTER — Ambulatory Visit (INDEPENDENT_AMBULATORY_CARE_PROVIDER_SITE_OTHER): Payer: Medicaid Other | Admitting: Obstetrics and Gynecology

## 2018-12-05 ENCOUNTER — Other Ambulatory Visit (HOSPITAL_COMMUNITY)
Admission: RE | Admit: 2018-12-05 | Discharge: 2018-12-05 | Disposition: A | Discharge status: Home / Self Care | Payer: Medicaid Other | Source: Ambulatory Visit | Attending: Obstetrics and Gynecology | Admitting: Obstetrics and Gynecology

## 2018-12-05 ENCOUNTER — Other Ambulatory Visit: Payer: Self-pay | Admitting: Obstetrics and Gynecology

## 2018-12-05 ENCOUNTER — Telehealth: Payer: Self-pay

## 2018-12-05 VITALS — BP 132/80 | HR 114 | Ht 67.0 in | Wt 182.9 lb

## 2018-12-05 DIAGNOSIS — J4520 Mild intermittent asthma, uncomplicated: Secondary | ICD-10-CM

## 2018-12-05 DIAGNOSIS — Z1379 Encounter for other screening for genetic and chromosomal anomalies: Secondary | ICD-10-CM

## 2018-12-05 DIAGNOSIS — Z720 Tobacco use: Secondary | ICD-10-CM

## 2018-12-05 DIAGNOSIS — Z77011 Contact with and (suspected) exposure to lead: Secondary | ICD-10-CM

## 2018-12-05 DIAGNOSIS — Z3A15 15 weeks gestation of pregnancy: Secondary | ICD-10-CM

## 2018-12-05 DIAGNOSIS — Z8659 Personal history of other mental and behavioral disorders: Secondary | ICD-10-CM

## 2018-12-05 DIAGNOSIS — I252 Old myocardial infarction: Secondary | ICD-10-CM

## 2018-12-05 DIAGNOSIS — E119 Type 2 diabetes mellitus without complications: Secondary | ICD-10-CM

## 2018-12-05 DIAGNOSIS — Z3482 Encounter for supervision of other normal pregnancy, second trimester: Secondary | ICD-10-CM | POA: Diagnosis not present

## 2018-12-05 DIAGNOSIS — E049 Nontoxic goiter, unspecified: Secondary | ICD-10-CM

## 2018-12-05 DIAGNOSIS — O0992 Supervision of high risk pregnancy, unspecified, second trimester: Secondary | ICD-10-CM

## 2018-12-05 DIAGNOSIS — F1290 Cannabis use, unspecified, uncomplicated: Secondary | ICD-10-CM

## 2018-12-05 DIAGNOSIS — O10919 Unspecified pre-existing hypertension complicating pregnancy, unspecified trimester: Secondary | ICD-10-CM

## 2018-12-05 DIAGNOSIS — E0500 Thyrotoxicosis with diffuse goiter without thyrotoxic crisis or storm: Secondary | ICD-10-CM

## 2018-12-05 DIAGNOSIS — F3181 Bipolar II disorder: Secondary | ICD-10-CM

## 2018-12-05 MED ORDER — ACCU-CHEK NANO SMARTVIEW W/DEVICE KIT: 1.0000 | PACK | 0 refills | Status: DC

## 2018-12-05 MED ORDER — NEBULIZER DEVI: 1.0000 | 0 refills | Status: DC | PRN

## 2018-12-05 MED ORDER — GLUCOSE BLOOD VI STRP: ORAL_STRIP | 12 refills | Status: DC

## 2018-12-05 MED ORDER — ACCU-CHEK SOFT TOUCH LANCETS MISC: 12 refills | Status: DC

## 2018-12-05 MED ORDER — ALBUTEROL SULFATE HFA 108 (90 BASE) MCG/ACT IN AERS: 2.0000 | INHALATION_SPRAY | RESPIRATORY_TRACT | 2 refills | Status: DC | PRN

## 2018-12-05 MED ORDER — ALBUTEROL SULFATE (2.5 MG/3ML) 0.083% IN NEBU: 2.5000 mg | INHALATION_SOLUTION | Freq: Four times a day (QID) | RESPIRATORY_TRACT | 12 refills | Status: DC | PRN

## 2018-12-05 NOTE — Progress Notes (Signed)
[name]-Pt for NOB PE pt stated that she is doing well no complaints.

## 2018-12-05 NOTE — Telephone Encounter (Signed)
Pharmacy called to speak with them about which glucose monitor would her insurance cover. The pharmacy used a discount card and the machine was covered for 29.99.

## 2018-12-06 ENCOUNTER — Encounter: Payer: Self-pay | Admitting: Obstetrics and Gynecology

## 2018-12-06 DIAGNOSIS — J4520 Mild intermittent asthma, uncomplicated: Secondary | ICD-10-CM | POA: Insufficient documentation

## 2018-12-06 DIAGNOSIS — F3181 Bipolar II disorder: Secondary | ICD-10-CM | POA: Insufficient documentation

## 2018-12-06 DIAGNOSIS — Z8659 Personal history of other mental and behavioral disorders: Secondary | ICD-10-CM | POA: Insufficient documentation

## 2018-12-06 DIAGNOSIS — F17200 Nicotine dependence, unspecified, uncomplicated: Secondary | ICD-10-CM | POA: Insufficient documentation

## 2018-12-06 DIAGNOSIS — O0992 Supervision of high risk pregnancy, unspecified, second trimester: Secondary | ICD-10-CM | POA: Insufficient documentation

## 2018-12-06 DIAGNOSIS — E119 Type 2 diabetes mellitus without complications: Secondary | ICD-10-CM | POA: Insufficient documentation

## 2018-12-06 DIAGNOSIS — I252 Old myocardial infarction: Secondary | ICD-10-CM | POA: Insufficient documentation

## 2018-12-06 DIAGNOSIS — Z77011 Contact with and (suspected) exposure to lead: Secondary | ICD-10-CM | POA: Insufficient documentation

## 2018-12-06 NOTE — Progress Notes (Signed)
[name]: Patient presents for MD care (initially seen by midwife) due to high risk nature of her pregnancy.  She is currently a 32 y.o. G2P1001 female at [redacted]w[redacted]d with the following pregnancy complications:    1. History of Type II DM, currently on no meds. Notes she has been on medications in the past (Metformin, insulin), but has not required in approximately 2 years.  2. History of Grave's disease.  Patient had not been on any medications in the past 2 years (notes she was prescribed but did not take in her last pregnancy as her levels remained stable). Was initiated on PTU at her NOB visit several weeks ago due to abnormal labs.  3. Chronic Hypertension - was placed on Labetalol last visit due to uncontrolled BPs. Was also started on a daily baby aspirin.  Denies any side effects of the medications currently.  4. Tobacco abuse - patient notes that she has been cutting down since oneset of pregnancy, previously smoking 1 ppd, now down to 5 cig/day.  5. History of marijuana use. Notes that she is cutting down use.  6. History of Cesarean section. Prior C-section due to attempts to induce labor (due to medical comorbidities in pregnancy). Only dilated to 3 cm, had C-section due to intolerance to labor.  7. H/o MI in 2017.  Patient reports having stents in place. Has not seen a Cardiologist since relocating from [place] 2 years ago.  Notes that she had a Cardiologist during her last pregnancy but had no issues and was signed off on during the pregnancy.  8. Asthma.  Patient with history of asthma. Denies any history of hospitalizations.  Notes last asthma attack was several weeks ago. States that her anxiety attacks typically progress to asthma attacks.  9. Mental illness. Patient reports that she has had a history of bipolar disorder since age 27. Also has a history of anxiety.  Has not been on any medications in the last 1-2 years.  Previously notes use of Seroquel, Depakote, Wellbutrin, and Zoloft in the  past. Is planning on seeing a counselor during this pregnancy (already has an appointment.  10. Lead exposure.  Patient reports that she lives in an old home. Is having symptoms of fatigue and occasional dyspnea.  Reports that her daughter has tested positive for lead exposure. Is working to get better housing but is noting some difficulties.    Plan:  1. History of diabetes.  Will prescribe glucometer, testing strips and lancets for patient to begin checking blood sugars again. Will also refer to Lif[facility]yles for re-education.  2. History of Grave's disease. Patient will need repeat thyroid screening at next visit. Currently on PTU. Notes compliance with medication. Also reporting difficulty swallowing. Patient does have a goiter. If no resolution by next visit, can refer to ENT. Referral also placed to Endocrinology as patient does not currently have one.  3. Chronic HTN. Patient's BPs well controlled on current dose of Labetalol.  To continue.  4. Tobacco and marijuana abuse - continue to encourage cessation.  5. H/o C-section x 1.  Again discussed TOLAC vs repeat C-section with patient. She notes that she has thought about it and desires to have repeat C-section.  6. History of MI (patient also notes stent placement) in 2017.  Currently on aspirin. Referral placed for Cardiology. Will also refer to MFM to assess if any further anticoagulation needed besides aspirin.  7. H/o asthma. Will prescribe Albuterol and nebulizers (patient notes she lost her machine when she  moved).  8. Mental illness. Discussed medication use vs therapy (currently notes mild symptoms but notes it is due to stress regarding her current living situation).  Is scheduled to f/u with a therapist in the next 1-2 weeks.  Would like to try therapy first before initiation of medication. Will continue to monitor closely.  9. Lead exposure - patient currently attempting to work on housing situation.  Will speak with Dorena Dew,  social worker regarding this further. Patient notes that there is a form that she can complete that may help her to move up on the housing waiting list (currently #[phone]).  Advised for patient to bring form as soon as possible. Also encouraged to have lead testing performed.  10. Pregnancy at 15 weeks. Has had normal genetic screening with [id number], for spina bifida screen today. Declines flu vaccine. RTC in 2 weeks for high risk follow up, and in 4 weeks for anatomy scan. Pap smear performed today (after further discussion, patient only had a pelvic exam to assess for vaginitis, did not had pap at last clinic).   A total of 40 minutes were spent face-to-face with the patient during the encounter and over half of that time involved counseling and coordination of care.   The patient has Medicaid.  [facility] Medicaid Risk Screening Form completed today   Hildred Laser, MD [facility]

## 2018-12-09 ENCOUNTER — Other Ambulatory Visit: Payer: Self-pay | Admitting: Obstetrics and Gynecology

## 2018-12-09 ENCOUNTER — Telehealth: Payer: Self-pay | Admitting: Obstetrics and Gynecology

## 2018-12-09 LAB — THYROID PANEL WITH TSH
Free Thyroxine Index: 6.5 — ABNORMAL HIGH (ref 1.2–4.9)
T3 Uptake Ratio: 35 % (ref 24–39)
T4, Total: 18.6 ug/dL (ref 4.5–12.0)
TSH: <0.006 u[IU]/mL — ABNORMAL LOW (ref 0.450–4.500)

## 2018-12-09 LAB — AFP, SERUM, OPEN SPINA BIFIDA
AFP MoM: 1.18
AFP Value: 35.4 ng/mL
Gest. Age on Collection Date: 15.4 weeks
Maternal Age At EDD: 32.9 yr
OSBR Risk 1 IN: 10000
Test Results:: NEGATIVE
Weight: 180 [lb_av]

## 2018-12-09 NOTE — Telephone Encounter (Signed)
The patient called and stated that she had a referral put in for her to see a cardiologist, The patient stated that the first available opening is the end of February. The patient would like to speak with a nurse or Dr. Valentino Saxon to confirm that It is okay to have an apt that far out. No other information was disclosed other than requesting a call back today if possible. Please advise.

## 2018-12-10 NOTE — Telephone Encounter (Signed)
Called pharmacy and gave them directions on how the glucose test should be done per [facility]. Pt should check her blood glucose levels 4x day. First check fasting and the other 3 checks 2 hours after each meal.

## 2018-12-10 NOTE — Telephone Encounter (Signed)
Please inform patient that she should take the appointment for February (but we will keep working behind the scenes to see if it can be moved up).   Dr. Valentino Saxon

## 2018-12-11 LAB — CYTOLOGY - PAP
DIAGNOSIS: NEGATIVE
HPV: NOT DETECTED

## 2018-12-11 NOTE — Telephone Encounter (Signed)
Pt called and was informed that [facility] wanted her to take the February appointment and we would work on getting her in sooner. Pt stated that she may have found a doctor that she could see earlier, but pt did not know the doctor's or office name. Pt was asked if she could get the information together and call the office back with it. Pt stated that she could.

## 2018-12-15 ENCOUNTER — Ambulatory Visit
Admission: RE | Admit: 2018-12-15 | Discharge: 2018-12-15 | Disposition: A | Discharge status: Home / Self Care | Payer: Medicaid Other | Source: Ambulatory Visit | Attending: Maternal & Fetal Medicine | Admitting: Maternal & Fetal Medicine

## 2018-12-15 ENCOUNTER — Other Ambulatory Visit
Admission: RE | Admit: 2018-12-15 | Discharge: 2018-12-15 | Disposition: A | Discharge status: Home / Self Care | Payer: Medicaid Other | Source: Ambulatory Visit | Attending: Maternal & Fetal Medicine | Admitting: Maternal & Fetal Medicine

## 2018-12-15 ENCOUNTER — Other Ambulatory Visit: Payer: Self-pay

## 2018-12-15 DIAGNOSIS — Z833 Family history of diabetes mellitus: Secondary | ICD-10-CM | POA: Diagnosis not present

## 2018-12-15 DIAGNOSIS — Z3A16 16 weeks gestation of pregnancy: Secondary | ICD-10-CM | POA: Insufficient documentation

## 2018-12-15 DIAGNOSIS — Z8249 Family history of ischemic heart disease and other diseases of the circulatory system: Secondary | ICD-10-CM | POA: Insufficient documentation

## 2018-12-15 DIAGNOSIS — F3181 Bipolar II disorder: Secondary | ICD-10-CM

## 2018-12-15 DIAGNOSIS — O24112 Pre-existing diabetes mellitus, type 2, in pregnancy, second trimester: Secondary | ICD-10-CM | POA: Insufficient documentation

## 2018-12-15 DIAGNOSIS — E0500 Thyrotoxicosis with diffuse goiter without thyrotoxic crisis or storm: Secondary | ICD-10-CM | POA: Insufficient documentation

## 2018-12-15 DIAGNOSIS — J4520 Mild intermittent asthma, uncomplicated: Secondary | ICD-10-CM

## 2018-12-15 DIAGNOSIS — F17210 Nicotine dependence, cigarettes, uncomplicated: Secondary | ICD-10-CM | POA: Diagnosis not present

## 2018-12-15 DIAGNOSIS — I10 Essential (primary) hypertension: Secondary | ICD-10-CM | POA: Diagnosis not present

## 2018-12-15 DIAGNOSIS — O10919 Unspecified pre-existing hypertension complicating pregnancy, unspecified trimester: Secondary | ICD-10-CM

## 2018-12-15 DIAGNOSIS — E119 Type 2 diabetes mellitus without complications: Secondary | ICD-10-CM

## 2018-12-15 DIAGNOSIS — Z955 Presence of coronary angioplasty implant and graft: Secondary | ICD-10-CM | POA: Diagnosis not present

## 2018-12-15 DIAGNOSIS — Z77011 Contact with and (suspected) exposure to lead: Secondary | ICD-10-CM

## 2018-12-15 DIAGNOSIS — I252 Old myocardial infarction: Secondary | ICD-10-CM | POA: Diagnosis not present

## 2018-12-15 DIAGNOSIS — E0590 Thyrotoxicosis, unspecified without thyrotoxic crisis or storm: Secondary | ICD-10-CM | POA: Diagnosis not present

## 2018-12-15 DIAGNOSIS — Z8659 Personal history of other mental and behavioral disorders: Secondary | ICD-10-CM

## 2018-12-15 LAB — BRAIN NATRIURETIC PEPTIDE: B Natriuretic Peptide: 24 pg/mL (ref 0.0–100.0)

## 2018-12-15 LAB — PROTEIN / CREATININE RATIO, URINE
Creatinine, Urine: 293 mg/dL
Protein Creatinine Ratio: 0.03 mg/mg{Cre} (ref 0.00–0.15)
Total Protein, Urine: 10 mg/dL

## 2018-12-15 LAB — GLUCOSE, CAPILLARY: Glucose-Capillary: 88 mg/dL (ref 70–99)

## 2018-12-15 NOTE — Progress Notes (Signed)
MFM Consultation  32 yo [id number] at 16w 6 days here for MFM consultation due to pregnancy complicated by maternal history of Type 2 DM, chronic HTN, hyperthyrodism (currently on PTU) and history of NSTEMI (records not available) requiring stent placement(mid LAD per [facility]. Discharge summary, stents reportedly placed at [facility])in 2017. She initially took oral anticoagulation following the event.  She was not diagnosed with hyperlipidemia, but does smoke (now 5cig/day from 1ppd), and was diagnosed with HTN and hyperthyroidism during her admission for myocardial infarction.  She reports feeling 'anxious" and like her heart was racing--she initially went to the ED and was given anxiety medications. She presented to another ED with same sxs, as well as feeling that "someone was sitting on my chest".  Her troponin was noted to be elevated and she was told she was having an MI.   She underwent c/s in 2018 at 39/5 weeks due to fetal intolerance to labor and delivered a 5lb 12 oz infant. Per records, induction was performed due to poorly controlled DM. She reports no complications during that pregnancy and initially saw cardiology for ecg/echo but was advised she did not need to return.  She reports was advised to take aspirin and no additional anticoagulation.   She was diagnosed with T2 DM in 2001 after feeling like she had a stomach virus---a family member checked her blood glucose and it was above meter's scale.  She was noted to have blood glucose value near 1000. She reports taking metformin and levamir/novolog (unable to give doses) however has not taken over last 1-2 days due to n/v and decreased dietary intake.  She is taking dicglesis and other agents (zofran, unisom) have been ineffective. She is able to keep some liquids down (lemonade).  Last Hgb A 1c (10/2018) was 4.8.  Currently, she does not have a glucometer.   Random blood glucose 88 mg/dl today.  Her daughter is Kayleeann Huxford for her age and  was recently diagnosed with elevated lead levels.  The person they share a home with was recently diagnosed with elevated lead levels, as well.   She's here today with her daughter Reche Dixon) and partner.   Records from [facility] were reviewed after her visit and current note reflects updated information.  Still awaiting records from 2017 (MI)  Past Medical History --History of MI (2017) and stent placement, as above. She reports no restrictions in activity --T2 DM--?will query further re end organ damage. Chart review reports neuropathy.   --Graves disease--reports increase in PTU dosage recently-per records, PTUre started during current pregnancy  Lab Results  Component Value Date   TSH <0.006 (L) 12/05/2018   Free T4 6.5 (1.2-4.9)  --cHTN--dx in 2017  OB History    Gravida  2   Para  1   Term  1   Preterm      AB      Living  1     SAB      TAB      Ectopic      Multiple      Live Births  1          2018 (11/21/2017), 40w 2  days, female infant via primary low transverse cesarean delivery, 5lb 12 oz, as above.  Reports did not take insulin or thyroid medications due to improvement in sxs. Records received from [facility] report induction of labor due to poorly controlled DM.   2015--Sab (D&C), Sab in 2004, 2007, 2007, 2013, 2014  2014 27 weeks, induction due to preeclampsia with severe features, 1.8kg (neonatal demise)  Past Surgical History:  Procedure Laterality Date  . ADENOIDECTOMY    . APPENDECTOMY    . DILATION AND CURETTAGE OF UTERUS    . OTHER SURGICAL HISTORY     sweat gland excision  . TONSILLECTOMY     Social History Not currently working.  Recently relocated to [place] [facility] [facility] from [place] due to partner's work. She still smokes but has decreased from 1ppd to 5cig/day.  She denies etoh or ilicit drug use.    Family History  Problem Relation Age of Onset  . Hypertension Mother   . Asthma Mother   . Diabetes Mother   . Heart  disease Mother   . Hyperlipidemia Mother   . Hypertension Father   . Kidney disease Father   . Heart disease Father   . Cancer Maternal Aunt   . Cancer Maternal Grandmother     Lab Results  Component Value Date   WBC 7.0 10/14/2018   HGB 12.9 10/14/2018   HCT 39.1 10/14/2018   MCV 77.0 (L) 10/14/2018   PLT 277 10/14/2018    FHR 150s  1. T2DM --concerns related to diabetes in pregnancy are largely due to complications associated with poor glycemic control.  Early fetal concerns are related to possible fetal malformations--cardiac malformations are a major malformation associated with poor glycemic control.  latr in gestation, hyperglycemia may lead to fetal macrosomia, increased risk for operative delivery (she plans to have a repeat c/s), intrauterine demise, and post natal metabolic derangements such as severe hypoglycemia requiring intensive care unit management.  Close monitoring and management of glycemic control can decrease these risks.  Additionally, pregestational diabetes incrases the risk for conditions such as preeclampsia.   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  Additionally, she should have an eye exam if not performed within the last year.  --she reports no glucometer and is receiving assistance from her primary ob office to obtain the meter.  We have also referred her to the lifestyles center to assist with her glycemic management during pregnancy and targets.  --goals: fasting less than 100mg /dl, 1h post meal under 161 mg/dl or 2h post meal under 120mg /dl --we have scheduled her for a midtrimester detailed anatomic survey here next week --she will need fetal echocardiogram ~20-22 weeks --fetal growth surveillance monthly after 24 weeks --weekly antenatal testing at 32 weeks and twice weekly at 34 weeks (or sooner if clinically  indicated) --delivery typically 39th week or sooner depending on glycemic control/fetpathy  2. cHTN--we reviewed the risks for preeclampsia and growth restriction. The combination of tobacco use and hypertension can increase the risk for placental abruption.  Severe abruption can result in fetal demise. She has decreased her tobacco use drastically but is working on cessation.   3. History of MI--the history suggests CAD related MI.  I have requested records and will review them when she returns for a follow up visit next week. She tolerated last pregnancy well and was scheduled for vaginal delivery. The hemodynamic and hypercoaguability changes in pregnancy can place additional stress on the heart; these hemodynamic changes typically peak in the late second trimester.   Her CARPREG (cardiac disease in pregnancy) score is 1 based so far based on available history.  Risks for cardiac event in pregnancy ~25%. --We would generally recommend delivery in a tertiary care setting.  --  We discussed likely need for delivery at [facility] based on her history.  She and her partner report challenges getting to [place].  --I would like to review her past records related to the MI --she should have baseline maternal Echo --BNP obtained today --Agree, with plans for her to see cardiology--agree with plans to try to obtain visit in next few weeks.  She is currently scheduled for consult in feb.  4. Hyperthyroidism--risks of poorly controlled hyperthyroidism include fetal growth restriction, preterm birth and maternal hyperthyroidism can lead to cardiac dysfunction/heart failure.  She is currently prescribed PTU and reports taking med.  --fetal growth/surveillance as per DM  5. Child with elevated lead levels---maternal lead level obtained today.  We will follow up results.  She plans to bring form to your office to allow for change in housing.   Time spent in consultation and coordination: 

## 2018-12-15 NOTE — Progress Notes (Signed)
Medical record release signed by pt for [facility] and [facility].  Record releases faxed to both hospitals for Dr. Dolphus Jenny [facility].

## 2018-12-15 NOTE — ED Notes (Signed)
Pt Blood Sugar was 88 this morning at the [facility]. Dr. Dolphus Jenny notified.  Pt also stated, that she did not currently have a Blood Glucose Meter at home and that her OBGYN was working with her to get a new one.

## 2018-12-16 LAB — LEAD, BLOOD (ADULT >= 16 YRS): Lead-Whole Blood: NOT DETECTED ug/dL (ref 0–4)

## 2018-12-18 ENCOUNTER — Telehealth: Payer: Self-pay | Admitting: Obstetrics and Gynecology

## 2018-12-18 ENCOUNTER — Ambulatory Visit (INDEPENDENT_AMBULATORY_CARE_PROVIDER_SITE_OTHER): Payer: Medicaid Other | Admitting: Obstetrics and Gynecology

## 2018-12-18 ENCOUNTER — Encounter: Payer: Self-pay | Admitting: Obstetrics and Gynecology

## 2018-12-18 ENCOUNTER — Other Ambulatory Visit: Payer: Self-pay

## 2018-12-18 VITALS — BP 136/82 | HR 94 | Wt 181.4 lb

## 2018-12-18 DIAGNOSIS — E0500 Thyrotoxicosis with diffuse goiter without thyrotoxic crisis or storm: Secondary | ICD-10-CM

## 2018-12-18 DIAGNOSIS — O0992 Supervision of high risk pregnancy, unspecified, second trimester: Secondary | ICD-10-CM

## 2018-12-18 DIAGNOSIS — E119 Type 2 diabetes mellitus without complications: Secondary | ICD-10-CM

## 2018-12-18 DIAGNOSIS — I252 Old myocardial infarction: Secondary | ICD-10-CM

## 2018-12-18 DIAGNOSIS — O99282 Endocrine, nutritional and metabolic diseases complicating pregnancy, second trimester: Secondary | ICD-10-CM

## 2018-12-18 LAB — POCT URINALYSIS DIPSTICK OB
Blood, UA: NEGATIVE
GLUCOSE, UA: NEGATIVE
LEUKOCYTES UA: NEGATIVE
Nitrite, UA: NEGATIVE
Odor: NEGATIVE
POC,PROTEIN,UA: NEGATIVE
Spec Grav, UA: 1.025 (ref 1.010–1.025)
Urobilinogen, UA: 0.2 E.U./dL
pH, UA: 6 (ref 5.0–8.0)

## 2018-12-18 NOTE — Telephone Encounter (Signed)
Notified patient that I have called and LM on lifestyles voicemail. I told her that I would call her back tomorrow to see if anyone had contacted her yet.

## 2018-12-18 NOTE — Progress Notes (Signed)
[name]- unable to eat/drink. Lead exposure per pt. Labs neg.

## 2018-12-18 NOTE — Telephone Encounter (Signed)
The patient called and stated that she has not heard from "lifestyles" today and was informed to call Dr. Logan Bores if she did not hear anything today. I tried to get more information from the patient but she cut me off and raised her voice and started taking over me with a very hostile tone, I let the patient know that I will send a message back to a nurse and then the patient hung up on me.

## 2018-12-18 NOTE — Progress Notes (Signed)
[name]: MFM consult done.  Consults for cardiology and endocrine are pending.  Patient to have a lifestyle appointment this week.  TSH today.  Patient has an appointment the first week in January with her therapist.  Again declines flu shot.  Patient would like to continue her care at [facility], additionally she has transportation issues and cannot frequently travel to [facility].  FAS next visit.

## 2018-12-19 ENCOUNTER — Telehealth: Payer: Self-pay | Admitting: Obstetrics and Gynecology

## 2018-12-19 LAB — THYROID PANEL WITH TSH
Free Thyroxine Index: 6.2 — ABNORMAL HIGH (ref 1.2–4.9)
T3 Uptake Ratio: 32 % (ref 24–39)
T4 TOTAL: 19.3 ug/dL — AB (ref 4.5–12.0)

## 2018-12-19 NOTE — Telephone Encounter (Signed)
Spoke with patient and she is going to see [facility] on Monday.

## 2018-12-19 NOTE — Telephone Encounter (Signed)
The patient states she forgot to tell her provider yesterday that she has not had a BM in a week; but she is taking her MIralax and even upped her dose to TID and QID and is now having bleeding and clotting when she tries to defecate.  She is asking for a provider to call her back today, please advise, thanks.

## 2018-12-19 NOTE — Telephone Encounter (Signed)
Pt was called and she stated that she think it has been a week maybe 2 weeks since her last BM. Pt was advised to take Milk of Magnesia as prescribed on the bottle, apple juice or prune juice 8oz 1-2 servings a day. Increase her fiber intake of oatmeal, fruits and green leafy vegetables, take a warm bath soaking in a tub of warm water. Pt was advised that if she has not had a BM by Monday to call the office to make an appointment to be seen by DJE.

## 2018-12-22 ENCOUNTER — Ambulatory Visit: Payer: Medicaid Other | Admitting: Dietician

## 2018-12-22 ENCOUNTER — Other Ambulatory Visit: Payer: Self-pay

## 2018-12-22 DIAGNOSIS — O10919 Unspecified pre-existing hypertension complicating pregnancy, unspecified trimester: Secondary | ICD-10-CM

## 2018-12-25 ENCOUNTER — Inpatient Hospital Stay: Admission: RE | Admit: 2018-12-25 | Payer: Medicaid Other | Source: Ambulatory Visit

## 2018-12-25 ENCOUNTER — Ambulatory Visit: Payer: Medicaid Other

## 2018-12-25 ENCOUNTER — Telehealth: Payer: Self-pay | Admitting: Maternal & Fetal Medicine

## 2018-12-25 NOTE — Progress Notes (Signed)
MFM Follow up call  Michelle Osborn was unable to make it to her appointment today due to URI--reports whole family with simlar sxs.  I reviewed her records received and called her.  She reports feeling better (end of day).  She and her family are still having car issues and will have difficulty making it to [facility] (without transportation assistance). She reports using [employer].   Review of records obtained from [facility] (site of cardiac cath)   03/2016--she presented to [facility] at the age of 32 with c/o of chest pain, shortness of breath and chest pressure with radiation to the left throat.  She reported worsening dyspnea on exertion ove the last month but symptoms on the day of admission were severe.  She was noted to have elevated troponin and T wave inversion in V6 on ECG.  D-dimer was negative.   She underwent ECHO(04/27/16)--EF 60%, no valve disease noted, trace mitral and tricuspid insufficiency, left ventricular diastolic relaxation mildly delayed.  Cath: severe stenosis of mid LAD segment and severe stenosis of proximal segment of diagonal artery.  There was an additional 50% narrowing immediately after the bifurcation of the two vessels.  The remaining disease in the LAD was nonobstructive.  She underwent PCTA to LAD and to the diagonal branch  Risk factors for CAD/acute coronary syndrome: diabetes, hypertension, tobacco use and  family history of sudden cardiac death before age 50 (grandmother died at age of 37 with a heart attack, uncle deceased at age 45 with MI, mother with CHF, father with CHF, female cousin with MI in late 20s), and peripheral neuropathy.   Although she did well during her last pregnancy (10/2017) we discussed the high risk for repeat cardiac disease/cardiac event During pregnancy. The records do confirm her report of a MI and stent placement x 2.    Records describe thrombophilia evaluation, however, results were not in the records.  We can obtain  limited thrombophilia evaluation in addition to baseline echocardiogram.     I did discuss the fact that we will plan to see her here for the follow up consult/ultrasound appointment, but we need to make plans to perform a consultation/transfer of care visit.   She may need assistance from pregnancy care managers to facilitate the appointments.   

## 2018-12-25 NOTE — Telephone Encounter (Signed)
MFM Follow up call  Michelle Osborn was unable to make it to her appointment today due to URI--reports whole family with simlar sxs.  I reviewed her records received and called her.  She reports feeling better (end of day).  She and her family are still having car issues and will have difficulty making it to [facility] (without transportation assistance). She reports using [employer].   Review of records obtained from [facility] (site of cardiac cath)   03/2016--she presented to [facility] at the age of 32 with c/o of chest pain, shortness of breath and chest pressure with radiation to the left throat.  She reported worsening dyspnea on exertion ove the last month but symptoms on the day of admission were severe.  She was noted to have elevated troponin and T wave inversion in V6 on ECG.  D-dimer was negative.   She underwent ECHO(04/27/16)--EF 60%, no valve disease noted, trace mitral and tricuspid insufficiency, left ventricular diastolic relaxation mildly delayed.  Cath: severe stenosis of mid LAD segment and severe stenosis of proximal segment of diagonal artery.  There was an additional 50% narrowing immediately after the bifurcation of the two vessels.  The remaining disease in the LAD was nonobstructive.  She underwent PCTA to LAD and to the diagonal branch  Risk factors for CAD/acute coronary syndrome: diabetes, hypertension, tobacco use and  family history of sudden cardiac death before age 30 (grandmother died at age of 32 with a heart attack, uncle deceased at age 94 with MI, mother with CHF, father with CHF, female cousin with MI in late 60s), and peripheral neuropathy.   Although she did well during her last pregnancy (10/2017) we discussed the high risk for repeat cardiac disease/cardiac event During pregnancy. The records do confirm her report of a MI and stent placement x 2.    Records describe thrombophilia evaluation, however, results were not in the records.  We can obtain  limited thrombophilia evaluation in addition to baseline echocardiogram.     I did discuss the fact that we will plan to see her here for the follow up consult/ultrasound appointment, but we need to make plans to perform a consultation/transfer of care visit.   She may need assistance from pregnancy care managers to facilitate the appointments.

## 2018-12-29 ENCOUNTER — Telehealth: Payer: Self-pay

## 2018-12-29 NOTE — Telephone Encounter (Signed)
Pt would like a call from [place].  She states she has nowhere to live. The lady she was living with is kicking her out. She would like some help.  Called MS - she is aware.

## 2019-01-05 ENCOUNTER — Other Ambulatory Visit: Payer: Self-pay

## 2019-01-07 ENCOUNTER — Telehealth: Payer: Self-pay

## 2019-01-07 ENCOUNTER — Ambulatory Visit (INDEPENDENT_AMBULATORY_CARE_PROVIDER_SITE_OTHER): Payer: Medicaid Other | Admitting: Obstetrics and Gynecology

## 2019-01-07 ENCOUNTER — Ambulatory Visit (INDEPENDENT_AMBULATORY_CARE_PROVIDER_SITE_OTHER): Payer: Medicaid Other

## 2019-01-07 ENCOUNTER — Other Ambulatory Visit: Payer: Self-pay | Admitting: Obstetrics and Gynecology

## 2019-01-07 VITALS — BP 121/76 | HR 97 | Wt 184.6 lb

## 2019-01-07 DIAGNOSIS — Z3492 Encounter for supervision of normal pregnancy, unspecified, second trimester: Secondary | ICD-10-CM

## 2019-01-07 DIAGNOSIS — G43009 Migraine without aura, not intractable, without status migrainosus: Secondary | ICD-10-CM

## 2019-01-07 DIAGNOSIS — Z363 Encounter for antenatal screening for malformations: Secondary | ICD-10-CM | POA: Diagnosis not present

## 2019-01-07 DIAGNOSIS — O0972 Supervision of high risk pregnancy due to social problems, second trimester: Secondary | ICD-10-CM

## 2019-01-07 DIAGNOSIS — Z98891 History of uterine scar from previous surgery: Secondary | ICD-10-CM

## 2019-01-07 DIAGNOSIS — O99282 Endocrine, nutritional and metabolic diseases complicating pregnancy, second trimester: Secondary | ICD-10-CM

## 2019-01-07 DIAGNOSIS — J4520 Mild intermittent asthma, uncomplicated: Secondary | ICD-10-CM

## 2019-01-07 DIAGNOSIS — I252 Old myocardial infarction: Secondary | ICD-10-CM

## 2019-01-07 DIAGNOSIS — E119 Type 2 diabetes mellitus without complications: Secondary | ICD-10-CM

## 2019-01-07 DIAGNOSIS — O34219 Maternal care for unspecified type scar from previous cesarean delivery: Secondary | ICD-10-CM

## 2019-01-07 DIAGNOSIS — E0590 Thyrotoxicosis, unspecified without thyrotoxic crisis or storm: Secondary | ICD-10-CM

## 2019-01-07 LAB — POCT URINALYSIS DIPSTICK OB
Bilirubin, UA: NEGATIVE
Blood, UA: NEGATIVE
Glucose, UA: NEGATIVE
KETONES UA: NEGATIVE
Leukocytes, UA: NEGATIVE
Nitrite, UA: NEGATIVE
PH UA: 6 (ref 5.0–8.0)
Spec Grav, UA: 1.025 (ref 1.010–1.025)
UROBILINOGEN UA: 0.2 U/dL

## 2019-01-07 MED ORDER — BUTALBITAL-APAP-CAFFEINE 50-325-40 MG PO CAPS: 1.0000 | ORAL_CAPSULE | Freq: Four times a day (QID) | ORAL | 3 refills | Status: DC | PRN

## 2019-01-07 NOTE — Telephone Encounter (Signed)
Called pharmacy to see which blood glucose meters they had that was covered by medicaid. Was informed that they had Accu-Check Aviva Plus.

## 2019-01-07 NOTE — Progress Notes (Signed)
[name]: Patient presents today with several social issues.  She notes that she and her partner and child were evicted from their home in December.  They are currently staying with her mother in [place] who does not desire to have them there, and is afraid that she will soon be homeless.  She notes that she has tried to work on getting moved up on the subsidized housing wait list, but has not had any success as of yet.  Her partner has also lost his job.  Patient has not been able to make several of her referral appointments due to lack of transportation.  They are depending on her mother for transportation needs, however notes that she is often not reliable.  She also notes that she has not been able to get the supplies that she needs, including her nebulizer device and her glucometer kit due to issues with the pharmacy. She is unsure of how much longer she will be able to keep up her doctor's appointments. Social worker Pricilla Loveless present and assisted with help in arranging for patient to get transportation to her appointments.  Patient's cardiology appointment has been moved up to Friday, 01/09/2019.  She also has an appointment with [facility] tomorrow, for follow-up.  Will need repeat thyroid labs next visit as she has only been taking her medication for the past 3 weeks.  Her blood pressures are well controlled on current labetalol dose.  Contacted patient's pharmacy in order to arrange for her to pick up her glucometer and testing supplies.  Still pending new referral to Endocrinology) preferrably in [place] as patient now living there and has limited transportation) as [employer] office is still booked out for 3 months.  S/p normal anatomy scan today. Patient reporting migraine x 2 days, not relieved with ES Tylenol. Will prescribe Fioricet. RTC in 2-3 weeks.    A total of 25 minutes were spent face-to-face with the patient during this encounter and over half of that time involved counseling and  coordination of care.

## 2019-01-07 NOTE — Progress Notes (Signed)
[name]-Pt is having a lot personal issues and is currently living with her mother and has no were to live. Pt stated that she has been really upset and was crying for 2 days.

## 2019-01-08 ENCOUNTER — Ambulatory Visit: Payer: Medicaid Other

## 2019-01-08 ENCOUNTER — Telehealth: Payer: Self-pay | Admitting: Obstetrics and Gynecology

## 2019-01-08 ENCOUNTER — Other Ambulatory Visit: Payer: Medicaid Other

## 2019-01-08 ENCOUNTER — Other Ambulatory Visit: Payer: Self-pay

## 2019-01-08 ENCOUNTER — Telehealth: Payer: Self-pay

## 2019-01-08 NOTE — Telephone Encounter (Signed)
Pt had an appointment today at [facility] for a Detailed [place] and MFM Consult. Confirmed with pt that she just had a Detailed [place] at [facility] yesterday.  Spoke with Dr. Valentino Saxon this morning to confirm that as well. So appt. cancelled today and rescheduled for January 23rd for detailed [place] and MFM consult in between her next [facility] appointment on Feb 5th.  Notified pt that I will also be making a referral to [facility] in [place] for a Fetal Echo in 1 month-they will contact her with an appointment time. Pt also confirmed that she is following up with a cardiology appt. for herself tomorrow 1/10.  Encouraged her to contact us with any concerns between now and her next appt. with [place] on 01/22/19.  Pt understood all information and appts.reviewed with her today.

## 2019-01-08 NOTE — Telephone Encounter (Signed)
The patient called complaining of a severe headache and was advised to let Dr. Valentino Saxon know so she is able to send something in to her pharmacy. Please advise.

## 2019-01-09 ENCOUNTER — Encounter: Payer: Self-pay | Admitting: Cardiovascular Disease

## 2019-01-09 ENCOUNTER — Ambulatory Visit (INDEPENDENT_AMBULATORY_CARE_PROVIDER_SITE_OTHER): Payer: Medicaid Other | Admitting: Cardiovascular Disease

## 2019-01-09 VITALS — BP 124/88 | HR 89 | Ht 67.0 in | Wt 189.8 lb

## 2019-01-09 DIAGNOSIS — E782 Mixed hyperlipidemia: Secondary | ICD-10-CM | POA: Diagnosis not present

## 2019-01-09 DIAGNOSIS — R000 Tachycardia, unspecified: Secondary | ICD-10-CM | POA: Diagnosis not present

## 2019-01-09 DIAGNOSIS — I25118 Atherosclerotic heart disease of native coronary artery with other forms of angina pectoris: Secondary | ICD-10-CM | POA: Diagnosis not present

## 2019-01-09 DIAGNOSIS — E118 Type 2 diabetes mellitus with unspecified complications: Secondary | ICD-10-CM | POA: Diagnosis not present

## 2019-01-09 DIAGNOSIS — Z331 Pregnant state, incidental: Secondary | ICD-10-CM

## 2019-01-09 DIAGNOSIS — O0992 Supervision of high risk pregnancy, unspecified, second trimester: Secondary | ICD-10-CM

## 2019-01-09 DIAGNOSIS — F17200 Nicotine dependence, unspecified, uncomplicated: Secondary | ICD-10-CM

## 2019-01-09 DIAGNOSIS — E0500 Thyrotoxicosis with diffuse goiter without thyrotoxic crisis or storm: Secondary | ICD-10-CM

## 2019-01-09 NOTE — Progress Notes (Signed)
Scheduled 1/16

## 2019-01-09 NOTE — Patient Instructions (Signed)
Zio on the 01/22/2019, Tachycardia/palpitations  Medication Instructions:  No changes  If you need a refill on your cardiac medications before your next appointment, please call your pharmacy.    Lab work: No new labs needed   If you have labs (blood work) drawn today and your tests are completely normal, you will receive your results only by: Marland Kitchen MyChart Message (if you have MyChart) OR . A paper copy in the mail If you have any lab test that is abnormal or we need to change your treatment, we will call you to review the results.   Testing/Procedures: No new testing needed   Follow-Up: At [facility], you and your health needs are our priority.  As part of our continuing mission to provide you with exceptional heart care, we have created designated Provider Care Teams.  These Care Teams include your primary Cardiologist (physician) and Advanced Practice Providers (APPs -  Physician Assistants and Nurse Practitioners) who all work together to provide you with the care you need, when you need it.  . You will need a follow up appointment in  As needed .   Marland Kitchen Providers on your designated Care Team:   . Nicolasa Ducking, NP . Eula Listen, PA-C . Marisue Ivan, PA-C  Any Other Special Instructions Will Be Listed Below (If Applicable).  For educational health videos Log in to : [email or web address] Or : [email or web address], password : [email or web address]

## 2019-01-09 NOTE — Progress Notes (Addendum)
Cardiology Office Note  Date:  01/09/2019   ID:  Michelle Osborn, DOB May 26, 1986, MRN [id number]  PCP:  Berenda Morale, MD   Chief Complaint  Patient presents with  . other    Ref by Dr. Valentino Saxon to establish care for a history of MI; pt. was seen in [place], [place] in 03/2016. Pt. is 5 months pregnant. Meds reviewed by the pt. verbally. Pt. c/o chest pain and shortness of breath and fatigue.     HPI:  [name] is a 33 year old woman with past medical history of Graves' disease h/o DM2, previously insulin dependent.  Smoking  Coronary artery disease NSTEMI with stents,  [place], s/p DES to LAD and D1 03/2016  HTN,  PCOS (polycystic ovarian syndrome)  currently pregnant Who presents by referral from Dr. Valentino Saxon for consultation of her coronary artery disease  Long discussion concerning prior coronary disease history Last seen by cardiologist September 2017 [facility] Reported developing chest pain 2017, was in and out of the emergency room several times but was given anxiety medications Reports that she went to a different hospital, they checked cardiac enzymes which were noted to be elevated, taken to cardiac catheterization lab and had a stent placed Stent was placed to the LAD and diagonal vessel  He does report having a " mediastinal mass that was found approximately 2 years ago on Lexiscan stress imaging" This could not be verified through the records  New symptoms including very strong palpitations, pounding, couple weeks,  Worse With activity, or rest Will last for several minutes, very symptomatic  Takes care of several children  Presents with fianc today  EKG personally reviewed by myself on todays visit Shows normal sinus rhythm rate 89 bpm no significant ST or T wave changes  Cardiac catheterization report requested and reviewed [facility] April 26, 2016 Non-STEMI Resolute drug-eluting stent placed to mid LAD Resolute 2.25 x 14 mm DES placed  to large diagonal Nonobstructive disease in the RCA and left circumflex  Strong family history of early coronary disease, mother died age 27 with heart attack, uncle died age 66 MI     PMH:   has a past medical history of Asthma, Diabetes mellitus, Hypertension, Hyperthyroidism, MI (myocardial infarction) (HCC), Neuropathy, and Thyroid disease.  PSH:    Past Surgical History:  Procedure Laterality Date  . ADENOIDECTOMY    . APPENDECTOMY    . CARDIAC CATHETERIZATION  03/2016   [place], [place]   . CORONARY ANGIOPLASTY WITH STENT PLACEMENT  03/2016  . DILATION AND CURETTAGE OF UTERUS    . OTHER SURGICAL HISTORY     sweat gland excision  . TONSILLECTOMY      Current Outpatient Medications  Medication Sig Dispense Refill  . albuterol (PROVENTIL HFA;VENTOLIN HFA) 108 (90 Base) MCG/ACT inhaler Inhale 2 puffs into the lungs every 4 (four) hours as needed for wheezing or shortness of breath. 1 Inhaler 2  . albuterol (PROVENTIL) (2.5 MG/3ML) 0.083% nebulizer solution Take 3 mLs (2.5 mg total) by nebulization every 6 (six) hours as needed for wheezing or shortness of breath. 75 mL 12  . aspirin 81 MG chewable tablet Chew 81 mg by mouth daily.    . Butalbital-APAP-Caffeine 50-325-40 MG capsule Take 1-2 capsules by mouth every 6 (six) hours as needed for headache. 30 capsule 3  . Doxylamine-Pyridoxine (DICLEGIS) 10-10 MG TBEC Take 2 tablets by mouth at bedtime. If symptoms persist, add one tablet in the morning and one in the afternoon 100 tablet 5  .  EPINEPHrine 0.3 mg/0.3 mL IJ SOAJ injection Inject into the muscle.    Marland Kitchen glucose blood (ACCU-CHEK AVIVA) test strip Use as instructed 100 each 12  . labetalol (NORMODYNE) 100 MG tablet Take 1 tablet (100 mg total) by mouth 2 (two) times daily. 60 tablet 1  . Prenatal Vit-Fe Fumarate-FA (PRENATAL VITAMIN) 27-0.8 MG TABS Take by mouth daily.    Marland Kitchen Respiratory Therapy Supplies (NEBULIZER) DEVI 1 Device by Does not apply route every 4 (four) hours as  needed. 1 each 0  . propylthiouracil (PTU) 50 MG tablet Take 50 mg by mouth 3 (three) times daily.     No current facility-administered medications for this visit.     Allergies:   Darvocet [propoxyphene n-acetaminophen]; Peanut-containing drug products; Penicillins; Cephalexin; Tramadol; and Toradol [ketorolac tromethamine]   Social History:  The patient  reports that she has been smoking. She has a 5.00 pack-year smoking history. She has never used smokeless tobacco. She reports current drug use. Drug: Marijuana. She reports that she does not drink alcohol.   Family History:   family history includes Asthma in her mother; Cancer in her maternal aunt and maternal grandmother; Diabetes in her mother; Heart disease in her father and mother; Heart failure in her mother; Hyperlipidemia in her mother; Hypertension in her father and mother; Kidney disease in her father.    Review of Systems: Review of Systems  Constitutional: Negative.   Respiratory: Negative.   Cardiovascular: Positive for palpitations.  Gastrointestinal: Negative.   Musculoskeletal: Negative.   Neurological: Negative.   Psychiatric/Behavioral: Negative.   All other systems reviewed and are negative.   PHYSICAL EXAM: VS:  BP 124/88 (BP Location: Right Arm, Patient Position: Sitting, Cuff Size: Normal)   Pulse 89   Ht 5\' 7"  (1.702 m)   Wt 189 lb 12 oz (86.1 kg)   LMP 08/15/2018 (Exact Date)   SpO2 99%   BMI 29.72 kg/m  , BMI Body mass index is 29.72 kg/m. GEN: Well nourished, well developed, in no acute distress  HEENT: normal  Neck: no JVD, carotid bruits, or masses Nontender fullness in the neck Cardiac: RRR; no murmurs, rubs, or gallops,no edema  Respiratory:  clear to auscultation bilaterally, normal work of breathing GI: soft, nontender, nondistended, + BS MS: no deformity or atrophy  Skin: warm and dry, no rash Neuro:  Strength and sensation are intact Psych: euthymic mood, full affect  Recent  Labs: 10/14/2018: ALT 23; BUN 7; Creatinine, Ser 0.41; Hemoglobin 12.9; Platelets 277; Potassium 4.1; Sodium 136 12/15/2018: B Natriuretic Peptide 24.0 12/18/2018: TSH <0.006    Lipid Panel Lab Results  Component Value Date   CHOL 176 11/18/2009   HDL 37 (L) 11/18/2009   LDLCALC 127 (H) 11/18/2009   TRIG 62 11/18/2009    Wt Readings from Last 3 Encounters:  01/09/19 189 lb 12 oz (86.1 kg)  01/07/19 184 lb 9.6 oz (83.7 kg)  12/18/18 181 lb 6.4 oz (82.3 kg)     ASSESSMENT AND PLAN:  Atherosclerosis of native coronary artery with stable angina pectoris, unspecified whether native or transplanted heart (HCC) -  Currently with no symptoms of angina. No further workup at this time. Continue current medication regimen.  We will need to restart her statin following pregnancy  Diabetes mellitus type 2 with complications (HCC) Prior hemoglobin A1c 1 month ago was well controlled 4.8  Tachycardia - Plan: EKG 12-Lead, LONG TERM MONITOR (3-14 DAYS) Etiology unclear, likely arrhythmia Unable to exclude atrial tachycardia, even a ventricular arrhythmia  We will place a long-term monitor Unable to place it today as she has a ride waiting for her.  She will come back into the clinic in the next week to have this monitor placed Recommended extra labetalol if arrhythmia does not break  Mixed hyperlipidemia We will restart statin following pregnancy  Graves disease She reports that she has been evaluated for possible surgery following pregnancy Longstanding history of thyroid disease Underlying thyroid disease may be contributing to arrhythmia Last thyroid June 2019 undetectable less than 0.01 -She would benefit from close monitoring by endocrinology -Could consider use of methimazole through pregnancy  High-risk pregnancy in second trimester History of coronary artery disease, diabetes, thyroid disease  Smoker Smoking cessation recommended Fianc also smokes  Disposition:   We will  call her with the results of her monitor  Long history of records reviewed through care everywhere Discussed with her in detail Complicated medical history  Total encounter time more than 60 minutes  Greater than 50% was spent in counseling and coordination of care with the patient     Orders Placed This Encounter  Procedures  . LONG TERM MONITOR (3-14 DAYS)  . EKG 12-Lead     Signed, Dossie Arbourim Graciano Batson, M.D., Ph.D. 01/09/2019  [facility] [place] [phone]

## 2019-01-12 ENCOUNTER — Telehealth: Payer: Self-pay | Admitting: Cardiovascular Disease

## 2019-01-12 ENCOUNTER — Other Ambulatory Visit: Payer: Self-pay

## 2019-01-12 ENCOUNTER — Telehealth: Payer: Self-pay

## 2019-01-12 ENCOUNTER — Inpatient Hospital Stay (HOSPITAL_COMMUNITY)
Admission: AD | Admit: 2019-01-12 | Discharge: 2019-01-12 | Disposition: A | Discharge status: Home / Self Care | Payer: Medicaid Other | Source: Ambulatory Visit | Attending: Obstetrics and Gynecology | Admitting: Obstetrics and Gynecology

## 2019-01-12 ENCOUNTER — Telehealth: Payer: Self-pay | Admitting: Obstetrics and Gynecology

## 2019-01-12 DIAGNOSIS — O10012 Pre-existing essential hypertension complicating pregnancy, second trimester: Secondary | ICD-10-CM | POA: Diagnosis not present

## 2019-01-12 DIAGNOSIS — Z3A20 20 weeks gestation of pregnancy: Secondary | ICD-10-CM | POA: Insufficient documentation

## 2019-01-12 DIAGNOSIS — O10919 Unspecified pre-existing hypertension complicating pregnancy, unspecified trimester: Secondary | ICD-10-CM | POA: Diagnosis not present

## 2019-01-12 DIAGNOSIS — G44209 Tension-type headache, unspecified, not intractable: Secondary | ICD-10-CM | POA: Diagnosis not present

## 2019-01-12 DIAGNOSIS — G44201 Tension-type headache, unspecified, intractable: Secondary | ICD-10-CM

## 2019-01-12 DIAGNOSIS — R102 Pelvic and perineal pain: Secondary | ICD-10-CM

## 2019-01-12 DIAGNOSIS — Z880 Allergy status to penicillin: Secondary | ICD-10-CM | POA: Diagnosis not present

## 2019-01-12 DIAGNOSIS — O26899 Other specified pregnancy related conditions, unspecified trimester: Secondary | ICD-10-CM

## 2019-01-12 DIAGNOSIS — O26892 Other specified pregnancy related conditions, second trimester: Secondary | ICD-10-CM | POA: Insufficient documentation

## 2019-01-12 DIAGNOSIS — O99332 Smoking (tobacco) complicating pregnancy, second trimester: Secondary | ICD-10-CM | POA: Diagnosis not present

## 2019-01-12 DIAGNOSIS — R109 Unspecified abdominal pain: Secondary | ICD-10-CM | POA: Diagnosis present

## 2019-01-12 DIAGNOSIS — F17210 Nicotine dependence, cigarettes, uncomplicated: Secondary | ICD-10-CM | POA: Insufficient documentation

## 2019-01-12 LAB — URINALYSIS, ROUTINE W REFLEX MICROSCOPIC
Bilirubin Urine: NEGATIVE
Glucose, UA: NEGATIVE mg/dL
Hgb urine dipstick: NEGATIVE
KETONES UR: 20 mg/dL — AB
LEUKOCYTES UA: NEGATIVE
NITRITE: NEGATIVE
Protein, ur: NEGATIVE mg/dL
Specific Gravity, Urine: 1.028 (ref 1.005–1.030)
pH: 5 (ref 5.0–8.0)

## 2019-01-12 MED ORDER — LABETALOL HCL 5 MG/ML IV SOLN: 40.0000 mg | INTRAVENOUS | Status: DC | PRN

## 2019-01-12 MED ORDER — CYCLOBENZAPRINE HCL 10 MG PO TABS
10.0000 mg | ORAL_TABLET | Freq: Once | ORAL | Status: AC
  Administered 2019-01-12: 10 mg via ORAL
  Filled 2019-01-12: qty 1

## 2019-01-12 MED ORDER — LABETALOL HCL 5 MG/ML IV SOLN: 20.0000 mg | INTRAVENOUS | Status: DC | PRN

## 2019-01-12 MED ORDER — LABETALOL HCL 5 MG/ML IV SOLN: 80.0000 mg | INTRAVENOUS | Status: DC | PRN

## 2019-01-12 MED ORDER — COMFORT FIT MATERNITY SUPP LG MISC: 1.0000 [IU] | Freq: Every day | 0 refills | Status: DC

## 2019-01-12 MED ORDER — LACTATED RINGERS IV SOLN: INTRAVENOUS | Status: DC

## 2019-01-12 MED ORDER — HYDRALAZINE HCL 20 MG/ML IJ SOLN: 10.0000 mg | INTRAMUSCULAR | Status: DC | PRN

## 2019-01-12 NOTE — MAU Provider Note (Signed)
History     CSN: [id number]  Arrival date and time: 01/12/19 9798   First Provider Initiated Contact with Patient 01/12/19 2034      Chief Complaint  Patient presents with  . Abdominal Pain  . Hypertension   Michelle Osborn is a 33 y.o. G2P1 at [redacted]w[redacted]d who presents to MAU with complaints of abdominal pain and HA. She reports working with [employer] today and pickup/lifting boxes that she knew she shouldn't have. After felt a sharp pain "like a rubberband snapping", rates pain 6/10- has not taken any medication for abdominal pain. 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 She denies vaginal discharge or vaginal bleeding. +FM. Reports HA that has been occurring for the pat 8 days, has taken Tylenol and Fioricet with little relief of HA. Rates pain 5/10. Just recently moved from [place] to [place] and plans to receive prenatal care here.    OB History    Gravida  2   Para  1   Term  1   Preterm      AB      Living  1     SAB      TAB      Ectopic      Multiple      Live Births  1           Past Medical History:  Diagnosis Date  . Asthma   . Diabetes mellitus   . Hypertension   . Hyperthyroidism   . MI (myocardial infarction) (HCC)   . Neuropathy   . Thyroid disease     Past Surgical History:  Procedure Laterality Date  . ADENOIDECTOMY    . APPENDECTOMY    . CARDIAC CATHETERIZATION  03/2016   [place], [place]   . CORONARY ANGIOPLASTY WITH STENT PLACEMENT  03/2016  . DILATION AND CURETTAGE OF UTERUS    . OTHER SURGICAL HISTORY     sweat gland excision  . TONSILLECTOMY      Family History  Problem Relation Age of Onset  . Hypertension Mother   . Asthma Mother   . Diabetes Mother   . Hyperlipidemia Mother   . Heart disease Mother   . Heart failure Mother        Defib placement  . Hypertension Father   . Kidney disease Father   . Heart disease Father    . Cancer Maternal Aunt   . Cancer Maternal Grandmother     Social History   Tobacco Use  . Smoking status: Current Every Day Smoker    Packs/day: 0.50    Years: 10.00    Pack years: 5.00  . Smokeless tobacco: Never Used  Substance Use Topics  . Alcohol use: No  . Drug use: Yes    Types: Marijuana    Allergies:  Allergies  Allergen Reactions  . Darvocet [Propoxyphene N-Acetaminophen] Anaphylaxis    Also makes her stomach hurt  . Peanut-Containing Drug Products Shortness Of Breath and Swelling  . Penicillins Shortness Of Breath and Swelling  . Cephalexin Hives and Itching  . Tramadol Nausea And Vomiting  . Toradol [Ketorolac Tromethamine] Rash    Red rash with bumps    Medications Prior to Admission  Medication Sig Dispense Refill Last Dose  . albuterol (PROVENTIL HFA;VENTOLIN HFA) 108 (90 Base) MCG/ACT inhaler Inhale 2 puffs into the lungs every 4 (four) hours as needed for wheezing  or shortness of breath. 1 Inhaler 2 Taking  . albuterol (PROVENTIL) (2.5 MG/3ML) 0.083% nebulizer solution Take 3 mLs (2.5 mg total) by nebulization every 6 (six) hours as needed for wheezing or shortness of breath. 75 mL 12 Taking  . aspirin 81 MG chewable tablet Chew 81 mg by mouth daily.   Taking  . Butalbital-APAP-Caffeine 50-325-40 MG capsule Take 1-2 capsules by mouth every 6 (six) hours as needed for headache. 30 capsule 3 Taking  . Doxylamine-Pyridoxine (DICLEGIS) 10-10 MG TBEC Take 2 tablets by mouth at bedtime. If symptoms persist, add one tablet in the morning and one in the afternoon 100 tablet 5 Taking  . EPINEPHrine 0.3 mg/0.3 mL IJ SOAJ injection Inject into the muscle.   Taking  . glucose blood (ACCU-CHEK AVIVA) test strip Use as instructed 100 each 12 Taking  . labetalol (NORMODYNE) 100 MG tablet Take 1 tablet (100 mg total) by mouth 2 (two) times daily. 60 tablet 1 Taking  . Prenatal Vit-Fe Fumarate-FA (PRENATAL VITAMIN) 27-0.8 MG TABS Take by mouth daily.   Taking  .  propylthiouracil (PTU) 50 MG tablet Take 50 mg by mouth 3 (three) times daily.   Taking  . Respiratory Therapy Supplies (NEBULIZER) DEVI 1 Device by Does not apply route every 4 (four) hours as needed. 1 each 0 Taking    Review of Systems  Constitutional: Negative.   Respiratory: Negative.   Cardiovascular: Negative.   Gastrointestinal: Positive for abdominal pain. Negative for constipation, diarrhea, nausea and vomiting.  Genitourinary: Negative.   Musculoskeletal: Negative for back pain.  Neurological: Positive for headaches. Negative for dizziness, weakness and light-headedness.   Physical Exam   Blood pressure 130/72, pulse 90, temperature 98.5 F (36.9 C), temperature source Oral, resp. rate 18, last menstrual period 08/15/2018, unknown if currently breastfeeding.  Physical Exam  Nursing note and vitals reviewed. Constitutional: She is oriented to person, place, and time. She appears well-developed and well-nourished. No distress.  Cardiovascular: Normal rate, regular rhythm and normal heart sounds.  Respiratory: Breath sounds normal. No respiratory distress. She has no wheezes. She has no rales.  GI: Soft. Bowel sounds are normal. There is no abdominal tenderness. There is no rebound.  Gravid appropriate for gestational age  Genitourinary:    No vaginal discharge or bleeding.  No bleeding in the vagina.  Musculoskeletal: Normal range of motion.        General: No edema.  Neurological: She is alert and oriented to person, place, and time.  Psychiatric: She has a normal mood and affect. Her behavior is normal. Thought content normal.   Dilation: Closed Effacement (%): Thick Cervical Position: Posterior Exam by:: V Kidus Delman CNM   FHR 148 by doppler  MAU Course  Procedures  MDM Orders Placed This Encounter  Procedures  . Urinalysis, Routine w reflex microscopic   Meds ordered this encounter  Medications  . cyclobenzaprine (FLEXERIL) tablet 10 mg  . Elastic Bandages &  Supports (COMFORT FIT MATERNITY SUPP LG) MISC    Sig: 1 Units by Does not apply route daily.    Dispense:  1 each    Refill:  0    Order Specific Question:   Supervising Provider    Answer:   Duane LopeEURE, LUTHER H [[id number]]   Treatments in MAU included Flexeril for RLP and HA. Patient reports pain is relieved after treatment in MAU. Discussed recommendations during pregnancy with no lifting heavier than 10-15 lbs, patient verbalizes understanding.   Message sent to HR clinic to get patient  scheduled for appointment this week as there has been a lapse in care and patient plans to [place] now that she has moved. Discussed reasons to return to MAU. Educated on use of maternity support belt and Rx given to patient. Pt stable at time of discharge.   Assessment and Plan   1. Pain of round ligament during pregnancy   2. Chronic hypertension during pregnancy, antepartum   3. [redacted] weeks gestation of pregnancy   4. Acute intractable tension-type headache    Discharge home Message sent to clinic to be scheduled prenatal appointment  Return to MAU as needed Hydration and use of support belt   Follow-up Information    [facility] Schedule an appointment as soon as possible for a visit.   Specialty:  Obstetrics and Gynecology Why:  Make appointment to be seen to initiate prenatal care [facility]  Contact information: [address] [place] [id number] [phone]       [facility] Follow up.   Why:  Go here to pick up maternity support belt  Contact information: [address] [place] [id number]  [phone]         Allergies as of 01/12/2019      Reactions   Darvocet [propoxyphene N-acetaminophen] Anaphylaxis   Also makes her stomach hurt   Peanut-containing Drug Products Shortness Of Breath, Swelling   Penicillins Shortness Of Breath, Swelling   Cephalexin Hives, Itching   Tramadol Nausea And  Vomiting   Toradol [ketorolac Tromethamine] Rash   Red rash with bumps      Medication List    TAKE these medications   albuterol 108 (90 Base) MCG/ACT inhaler Commonly known as:  PROVENTIL HFA;VENTOLIN HFA Inhale 2 puffs into the lungs every 4 (four) hours as needed for wheezing or shortness of breath.   albuterol (2.5 MG/3ML) 0.083% nebulizer solution Commonly known as:  PROVENTIL Take 3 mLs (2.5 mg total) by nebulization every 6 (six) hours as needed for wheezing or shortness of breath.   aspirin 81 MG chewable tablet Chew 81 mg by mouth daily.   Butalbital-APAP-Caffeine 50-325-40 MG capsule Take 1-2 capsules by mouth every 6 (six) hours as needed for headache.   COMFORT FIT MATERNITY SUPP LG Misc 1 Units by Does not apply route daily.   Doxylamine-Pyridoxine 10-10 MG Tbec Commonly known as:  DICLEGIS Take 2 tablets by mouth at bedtime. If symptoms persist, add one tablet in the morning and one in the afternoon   EPINEPHrine 0.3 mg/0.3 mL Soaj injection Commonly known as:  EPI-PEN Inject into the muscle.   glucose blood test strip Commonly known as:  ACCU-CHEK AVIVA Use as instructed   labetalol 100 MG tablet Commonly known as:  NORMODYNE Take 1 tablet (100 mg total) by mouth 2 (two) times daily.   Nebulizer Devi 1 Device by Does not apply route every 4 (four) hours as needed.   Prenatal Vitamin 27-0.8 MG Tabs Take by mouth daily.   propylthiouracil 50 MG tablet Commonly known as:  PTU Take 50 mg by mouth 3 (three) times daily.       Michelle Osborn CNM  01/12/2019, 22:58 PM

## 2019-01-12 NOTE — Telephone Encounter (Signed)
Can you please see if you can get the patient in with someone else.

## 2019-01-12 NOTE — Telephone Encounter (Signed)
Boyd Kerbs called from [facility] (recent referral) and stated they do not accept Medicaid and cannot see the patient, please advise, thanks.

## 2019-01-12 NOTE — MAU Note (Signed)
Pt arrived EMS. This afternoon lifted heavy boxes at [facility] and has had some sharp abdominal pain since. Had initial BP of 200/130, which had gotten lower, but still in severe range of 170/90. Pt has Hx of cardiac stent, MI and hyperthyroid.

## 2019-01-12 NOTE — MAU Note (Signed)
Pt here with c/o abdominal pain and headache, called EMS. BP was elevated on the transfer. Denies any bleeding or leaking. Having some white discharge.

## 2019-01-12 NOTE — Telephone Encounter (Signed)
Michelle Osborn cancelled her appt. for 01/15/19 and stated. " I won't be coming back to [place] anymore because I live in [place] now and that she would be cancelling her appt. at [facility] as well. Pt frustrated that it is just too hard to get transportation to [place] from [place] and that Medicaid wont help her with this since she is no longer in [place]. Dr. Leatha Gilding notified and is concerned that pt needs to be seen bc of her history and current pregnancy. Called [facility] and spoke with Alfredia Client the Child psychotherapist about pt concerns and the recommendation that the patient be transferred to [facility] if she is unable to arrange transportation to [place] or [facility].  Marilynn to relay this information on to Dr. Valentino Saxon who is out of the office today.

## 2019-01-12 NOTE — Telephone Encounter (Signed)
Patient calling  States that she is no longer living in [place] - had a last minute move to [place] Would not have any way to get to [facility] for holter monitor May be available to go to [facility] - please advise if order can be changed to [facility]  Please call to discuss - best number to contact at this time is [phone]

## 2019-01-13 NOTE — Telephone Encounter (Signed)
Fine for patient to have put on in [place]. Routing to [employer] with scheduling to help aid in this process.

## 2019-01-13 NOTE — Telephone Encounter (Signed)
Redirected Referral to [facility] Endocrinology.

## 2019-01-13 NOTE — Telephone Encounter (Signed)
Noted  

## 2019-01-14 ENCOUNTER — Telehealth: Payer: Self-pay

## 2019-01-14 NOTE — Telephone Encounter (Signed)
Pt states she dropped off a from from wic/welfare office weeks ago. This form allows her to get a check to help with her needs. She could not remember who she left it with or what provider she spoke to about it. Informed pt to my chart the form or bring it by the office. She has transportation issues so coming by is not easy for her.   She is going to try and my chart it to [place] and also send it to [employer]. Informed her once we got the form we would let her know.

## 2019-01-15 ENCOUNTER — Other Ambulatory Visit: Payer: Medicaid Other

## 2019-01-15 ENCOUNTER — Ambulatory Visit: Payer: Medicaid Other

## 2019-01-16 ENCOUNTER — Encounter: Payer: Self-pay | Admitting: Obstetrics and Gynecology

## 2019-01-16 ENCOUNTER — Telehealth: Payer: Self-pay | Admitting: Obstetrics and Gynecology

## 2019-01-16 ENCOUNTER — Ambulatory Visit (INDEPENDENT_AMBULATORY_CARE_PROVIDER_SITE_OTHER): Payer: Medicaid Other | Admitting: Obstetrics and Gynecology

## 2019-01-16 VITALS — BP 129/89 | HR 93 | Wt 186.4 lb

## 2019-01-16 DIAGNOSIS — O10912 Unspecified pre-existing hypertension complicating pregnancy, second trimester: Secondary | ICD-10-CM

## 2019-01-16 DIAGNOSIS — O24912 Unspecified diabetes mellitus in pregnancy, second trimester: Secondary | ICD-10-CM

## 2019-01-16 DIAGNOSIS — O0990 Supervision of high risk pregnancy, unspecified, unspecified trimester: Secondary | ICD-10-CM

## 2019-01-16 DIAGNOSIS — O99282 Endocrine, nutritional and metabolic diseases complicating pregnancy, second trimester: Secondary | ICD-10-CM

## 2019-01-16 DIAGNOSIS — O10919 Unspecified pre-existing hypertension complicating pregnancy, unspecified trimester: Secondary | ICD-10-CM

## 2019-01-16 DIAGNOSIS — O0992 Supervision of high risk pregnancy, unspecified, second trimester: Secondary | ICD-10-CM

## 2019-01-16 DIAGNOSIS — I252 Old myocardial infarction: Secondary | ICD-10-CM

## 2019-01-16 DIAGNOSIS — E0590 Thyrotoxicosis, unspecified without thyrotoxic crisis or storm: Secondary | ICD-10-CM

## 2019-01-16 DIAGNOSIS — Z98891 History of uterine scar from previous surgery: Secondary | ICD-10-CM

## 2019-01-16 DIAGNOSIS — O24919 Unspecified diabetes mellitus in pregnancy, unspecified trimester: Secondary | ICD-10-CM | POA: Insufficient documentation

## 2019-01-16 MED ORDER — GLUCOSE BLOOD VI STRP: ORAL_STRIP | 12 refills | Status: DC

## 2019-01-16 MED ORDER — ACCU-CHEK FASTCLIX LANCETS MISC: 1.0000 [IU] | Freq: Four times a day (QID) | 12 refills | Status: DC

## 2019-01-16 MED ORDER — ACCU-CHEK GUIDE W/DEVICE KIT: 1.0000 | PACK | Freq: Four times a day (QID) | 0 refills | Status: DC

## 2019-01-16 NOTE — Telephone Encounter (Signed)
The patient called and asked if the MRA form was faxed.  She had messaged her nurse and asked her to wait to fax it but she was wanting to know if the message was received in time or if her nurse already faxed it, and she is asking for a call back at [phone] today if possible, please advise, thanks.

## 2019-01-16 NOTE — Telephone Encounter (Signed)
Spoke with pt via mychart

## 2019-01-16 NOTE — Telephone Encounter (Signed)
Replied to pt via my chart.

## 2019-01-19 ENCOUNTER — Telehealth: Payer: Self-pay

## 2019-01-19 NOTE — Telephone Encounter (Signed)
Spoke with pharamcy due to getting a rejected on the pt's accu-check aviva they stated that she needed a prior authorization completed for the kit.  

## 2019-01-20 ENCOUNTER — Ambulatory Visit (HOSPITAL_COMMUNITY): Admission: RE | Admit: 2019-01-20 | Payer: Medicaid Other | Source: Ambulatory Visit

## 2019-01-20 ENCOUNTER — Telehealth: Payer: Self-pay | Admitting: Family Medicine

## 2019-01-20 NOTE — Progress Notes (Signed)
Subjective:  Michelle Osborn is a 33 y.o. G2P1001 at 74w0dbeing seen today for ongoing prenatal care. [place]   She is currently monitored for the following issues for this high-risk pregnancy and has Diabetes mellitus type 2 with complications ([place]; VITAMIN D DEFICIENCY; HYPERCHOLESTEROLEMIA; HYPERTENSION; ASTHMA; GERD; MENORRHAGIA; HIDRADENITIS SUPPURATIVA; Graves disease; History of cesarean section; Nausea/vomiting in pregnancy; History of marijuana use; Lead exposure; Smoker; Bipolar 2 disorder ([place]; History of anxiety; Mild intermittent asthma without complication; Type 2 diabetes mellitus without complication, without long-term current use of insulin ([place]; High-risk pregnancy in second trimester; Chronic hypertension in pregnancy; History of heart attack; Hyperthyroidism affecting pregnancy in second trimester; 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  Patient reports no complaints.  Contractions: Irritability. Vag. Bleeding: None.  Movement: Present. Denies leaking of fluid.   The following portions of the patient's history were reviewed and updated as appropriate: allergies, current medications, past family history, past medical history, past social history, past surgical history and problem list. Problem list updated.  Objective:   Vitals:   01/16/19 1030  BP: 129/89  Pulse: 93  Weight: 84.6 kg    Fetal Status: Fetal Heart Rate (bpm): 149   Movement: Present     General:  Alert, oriented and cooperative. Patient is in no acute distress.  Skin: Skin is warm and dry. No rash noted.   Cardiovascular: Normal heart rate noted  Respiratory: Normal respiratory effort, no problems with respiration noted  Abdomen: Soft, gravid, appropriate for gestational age. Pain/Pressure:  Present     Pelvic:  Cervical exam deferred        Extremities: Normal range of motion.  Edema: Trace  Mental Status: Normal mood and affect. Normal behavior. Normal judgment and thought content.   Urinalysis:      Assessment and Plan:  Pregnancy: G2P1001 at 274w0d1. Supervision of high risk pregnancy, antepartum Sign for release of OB recordes  2. History of cesarean section   3. History of heart attack Has been evaluated by cardiology. Holter monitor ordered.  [place]  4. Chronic hypertension in pregnancy BP stable - AMB referral to maternal fetal medicine  5. Diabetes mellitus affecting pregnancy in second trimester Pt has been unable to obtain glucose meter. Order resent - AMB referral to maternal fetal medicine - Blood Glucose Monitoring Suppl (ACCU-CHEK GUIDE) w/Device KIT; 1 Device by Does not apply route 4 (four) times daily.  Dispense: 1 kit; Refill: 0 - ACCU-CHEK FASTCLIX LANCETS MISC; 1 Units by Percutaneous route 4 (four) times daily.  Dispense: 100 each; Refill: 12 - glucose blood test strip; Use as instructed  Dispense: 100 each; Refill: 12  6. Hyperthyroidism affecting pregnancy in second trimester  - AMB referral to maternal fetal medicine  Preterm labor symptoms and general obstetric precautions including but not limited to vaginal bleeding, contractions, leaking of fluid and fetal movement were reviewed in detail with the patient. Please refer to After Visit Summary for other counseling recommendations.  Return in about 2 weeks (around 01/30/2019) for OB visit.   ErChancy MilroyMD

## 2019-01-20 NOTE — Telephone Encounter (Signed)
Our office received a a message from [facility] that the patient wanted to cancel her appt, I called the patient and did not get an answer and was not able to leave a message

## 2019-01-22 ENCOUNTER — Ambulatory Visit: Payer: Medicaid Other

## 2019-01-22 ENCOUNTER — Other Ambulatory Visit: Payer: Medicaid Other

## 2019-01-22 DIAGNOSIS — I25118 Atherosclerotic heart disease of native coronary artery with other forms of angina pectoris: Secondary | ICD-10-CM

## 2019-01-22 DIAGNOSIS — R000 Tachycardia, unspecified: Secondary | ICD-10-CM

## 2019-01-27 ENCOUNTER — Encounter: Payer: Medicaid Other | Admitting: Internal Medicine

## 2019-01-27 ENCOUNTER — Other Ambulatory Visit: Payer: Medicaid Other

## 2019-02-04 ENCOUNTER — Ambulatory Visit (INDEPENDENT_AMBULATORY_CARE_PROVIDER_SITE_OTHER): Payer: Medicaid Other | Admitting: Obstetrics and Gynecology

## 2019-02-04 ENCOUNTER — Encounter: Payer: Self-pay | Admitting: Obstetrics and Gynecology

## 2019-02-04 VITALS — BP 120/70 | HR 87 | Wt 192.1 lb

## 2019-02-04 DIAGNOSIS — E0590 Thyrotoxicosis, unspecified without thyrotoxic crisis or storm: Secondary | ICD-10-CM

## 2019-02-04 DIAGNOSIS — O99282 Endocrine, nutritional and metabolic diseases complicating pregnancy, second trimester: Secondary | ICD-10-CM

## 2019-02-04 DIAGNOSIS — O0990 Supervision of high risk pregnancy, unspecified, unspecified trimester: Secondary | ICD-10-CM

## 2019-02-04 LAB — POCT URINALYSIS DIPSTICK OB
Bilirubin, UA: NEGATIVE
Blood, UA: NEGATIVE
Glucose, UA: NEGATIVE
Ketones, UA: NEGATIVE
Leukocytes, UA: NEGATIVE
Nitrite, UA: NEGATIVE
Odor: NEGATIVE
POC,PROTEIN,UA: NEGATIVE
Spec Grav, UA: 1.01 (ref 1.010–1.025)
Urobilinogen, UA: 0.2 E.U./dL
pH, UA: 7 (ref 5.0–8.0)

## 2019-02-04 NOTE — Progress Notes (Signed)
[name]: Patient wearing a cardiac monitor for tachycardia.  She reports she is otherwise doing well.  Daily fetal movement.  1 hour GCT next visit.  Continues to take PTU I have put in another referral for endocrinology consultation.  Patient has reexpressed her desire for repeat cesarean delivery.

## 2019-02-04 NOTE — Progress Notes (Signed)
[name]- no complaints. Decline the flu vaccine.

## 2019-02-06 ENCOUNTER — Telehealth: Payer: Self-pay | Admitting: Obstetrics and Gynecology

## 2019-02-06 NOTE — Telephone Encounter (Signed)
The patient states she is having trouble moving her legs, a lot of pain, and was up all night.  The patient states the baby is moving fine.  When the patient tries to stand, she states her legs feel asleep.  The pain is burning and cramping.  This has been since Wednesday night until today.  She has tried propping them up with pillows, and everything she can think of.  She states it is beyond char[place] horse cramps.  She is asking for the nurse to call her back, please advise, thanks.

## 2019-02-10 ENCOUNTER — Telehealth: Payer: Self-pay | Admitting: Obstetrics and Gynecology

## 2019-02-10 NOTE — Telephone Encounter (Signed)
Pt called number is disconnected. 

## 2019-02-10 NOTE — Telephone Encounter (Signed)
Pt called number called is mother's phone. LM with mother for pt to call the office.

## 2019-02-10 NOTE — Telephone Encounter (Signed)
Please see another phone encounter.  

## 2019-02-10 NOTE — Telephone Encounter (Signed)
The patient called and asked if she could get a referral to someone to help her with her incisions on her arms from surgeries.  The patient states the incisions are opening.  She also asked if someone could call her back about her left breast skin tag.  She states she has not heard back about that either, please advise, thanks.

## 2019-02-10 NOTE — Telephone Encounter (Signed)
Patient returned call. She seemed upset that you were not available at the moment. She stated she did not want a call back and to disregard the whole thing.

## 2019-02-10 NOTE — Telephone Encounter (Signed)
Pt called mother's phone number. Pt was unable to come to the phone due to her being at an appointment.

## 2019-02-10 NOTE — Telephone Encounter (Signed)
Pt called the number that was available which was her mother's cell phone number. Pt's mother stated that pt was unable to come to the phone due to her being at an appointment.

## 2019-02-11 NOTE — Telephone Encounter (Signed)
Pt called and spoke with her concerning all of her issues for her calls to the office. Pt stated that during the night her legs cramp really bad causing her to wake up in the middle of the night in tears. Pt was advised to try lay on her side with a pillow between her legs. Pt stated that she always lay on her side. Pt stated that she went she made an appointment with the doctor that did her surgery and had an doctor's appointment with them yesterday to check on her arm and issues with her old incisions opening up. Pt stated at her last visit with DJE that she had showed him the area on her area and breast area and give her medication for it.

## 2019-02-17 ENCOUNTER — Ambulatory Visit: Payer: Medicaid Other | Admitting: Cardiovascular Disease

## 2019-02-27 ENCOUNTER — Telehealth: Payer: Self-pay | Admitting: Obstetrics and Gynecology

## 2019-02-27 NOTE — Telephone Encounter (Signed)
The patient called and stated she is having cold symptoms. I went over safe medications she can take during pregnancy. Patient voiced understanding, Patient stated she will try options given to her. I informed the patient to call the office in 2-3 days if her symptoms have not improved. Thank you.

## 2019-03-02 ENCOUNTER — Telehealth: Payer: Self-pay | Admitting: Obstetrics and Gynecology

## 2019-03-02 NOTE — Telephone Encounter (Signed)
Patient called requesting an antibiotic or pain medication. She states she has a sinus infection that is causes severe tooth pain. Due to the tooth pain she is not able to eat and has had no relief wit tylenol. She would like a call back ASAP

## 2019-03-02 NOTE — Telephone Encounter (Signed)
Pt was called back and she stated that she has tried all of the OTC  medication on the safe medication list and she is still having cold symptoms. Pt stated that she is unable to breath through her nose, blowing nose okay but not able to clear it. Pt stated that she has a headache and sinus pressure. Pt is requesting for pain/sinus medication.  Pt was informed that no doctors were in the office today and a message would have to be sent to them. Pt is aware that it maybe tomorrow before receiving an answer.

## 2019-03-03 NOTE — Telephone Encounter (Signed)
Pt called back and spoke with her and gave her the information [facility] requested. Pt stated that she would try them and would not like to do her 28 week 1 hour GT due to her being sick. When reviewed her appt pt did not have an appointment for an 1 hr GT. Pt stated that she would schedule the 1 hour GT after her visit tomorrow for another day when she feels better.

## 2019-03-03 NOTE — Telephone Encounter (Signed)
Pt called no answer LM via voicemail to call the office to inform her that [facility] requested that she try Tylenol Cold and Sinus, Alka seltzer cold and sinus, and also Saline Spray.

## 2019-03-04 ENCOUNTER — Encounter: Payer: Self-pay | Admitting: Obstetrics and Gynecology

## 2019-03-04 ENCOUNTER — Ambulatory Visit (INDEPENDENT_AMBULATORY_CARE_PROVIDER_SITE_OTHER): Payer: Medicaid Other | Admitting: Obstetrics and Gynecology

## 2019-03-04 VITALS — BP 133/75 | HR 91 | Wt 200.3 lb

## 2019-03-04 DIAGNOSIS — E0590 Thyrotoxicosis, unspecified without thyrotoxic crisis or storm: Secondary | ICD-10-CM

## 2019-03-04 DIAGNOSIS — E119 Type 2 diabetes mellitus without complications: Secondary | ICD-10-CM

## 2019-03-04 DIAGNOSIS — F1290 Cannabis use, unspecified, uncomplicated: Secondary | ICD-10-CM

## 2019-03-04 DIAGNOSIS — I252 Old myocardial infarction: Secondary | ICD-10-CM

## 2019-03-04 DIAGNOSIS — O34219 Maternal care for unspecified type scar from previous cesarean delivery: Secondary | ICD-10-CM

## 2019-03-04 DIAGNOSIS — O0993 Supervision of high risk pregnancy, unspecified, third trimester: Secondary | ICD-10-CM

## 2019-03-04 DIAGNOSIS — K0889 Other specified disorders of teeth and supporting structures: Secondary | ICD-10-CM

## 2019-03-04 DIAGNOSIS — J3489 Other specified disorders of nose and nasal sinuses: Secondary | ICD-10-CM

## 2019-03-04 DIAGNOSIS — O99283 Endocrine, nutritional and metabolic diseases complicating pregnancy, third trimester: Secondary | ICD-10-CM

## 2019-03-04 DIAGNOSIS — O10919 Unspecified pre-existing hypertension complicating pregnancy, unspecified trimester: Secondary | ICD-10-CM

## 2019-03-04 LAB — POCT URINALYSIS DIPSTICK OB
Bilirubin, UA: NEGATIVE
Blood, UA: NEGATIVE
Glucose, UA: NEGATIVE
Ketones, UA: NEGATIVE
Leukocytes, UA: NEGATIVE
Nitrite, UA: NEGATIVE
POC,PROTEIN,UA: NEGATIVE
Spec Grav, UA: 1.01 (ref 1.010–1.025)
Urobilinogen, UA: 0.2 E.U./dL
pH, UA: 6.5 (ref 5.0–8.0)

## 2019-03-04 MED ORDER — TERCONAZOLE 0.4 % VA CREA: 1.0000 | TOPICAL_CREAM | Freq: Every day | VAGINAL | 0 refills | Status: DC

## 2019-03-04 MED ORDER — TETANUS-DIPHTH-ACELL PERTUSSIS 5-2.5-18.5 LF-MCG/0.5 IM SUSP
0.5000 mL | Freq: Once | INTRAMUSCULAR | Status: AC
  Administered 2019-03-04: 0.5 mL via INTRAMUSCULAR

## 2019-03-04 MED ORDER — AZITHROMYCIN 250 MG PO TABS: ORAL_TABLET | ORAL | 1 refills | Status: DC

## 2019-03-04 MED ORDER — LORATADINE 10 MG PO TABS: 10.0000 mg | ORAL_TABLET | Freq: Every day | ORAL | 6 refills | Status: DC

## 2019-03-04 NOTE — Progress Notes (Signed)
[name]: Patient complains of bad sinus infection for the past week. Notes nasal discharge has turned from clear to yellow-green. Has been taking Robitusin and has started use of nasal saline spray without relief.   Is also noting jaw/tooth pain in upper right bicuspid.  Oral exam without any acute signs of infection, however some mild decay noted. Will refer to dentist as patient has not had dental care in a while.  Will prescribe Z-pac for sinus infection and encouraged to take Claritin for possible allergic rhinitis.    She reports that she has not been as compliant with checking her blood sugars due to her and her daughter being sick.  However of the ones she checks, she notes average 80s fasting, highest is 117 postprandial.  She also reports that she is planning on cutting out smoking marijuana in the next several weeks.   She has an endocrinology appointment on Friday, which she notes that she "hopes to make", due to issues in the past with transportation.  Does report compliance with her thyroid medication.  Also notes compliance with her Labetalol.   In case patient does not make it to her appointment, will order thyroid panel in addition to her 28 week labs today.   Will need to discuss again regarding her plans for delivery. Is planning for repeat C-section, however recommendations made at the beginning of pregnancy for patient to be delivered at [facility]. Patient has had issues in the past with transportation and so will need to begin planning.    RTC in 2 weeks.  Will need to begin antenatal testing and have growth scan beginning at 32 weeks.

## 2019-03-04 NOTE — Progress Notes (Signed)
[name]-Pt is present today for prenatal visit. Pt stated that she is having sinus issues and have tried several OTC medication to clear it, but it is not helping. BTC and tdap done today.

## 2019-03-04 NOTE — Patient Instructions (Signed)
Common Medications Safe in Pregnancy  Acne:      Constipation:  Benzoyl Peroxide     Colace  Clindamycin      Dulcolax Suppository  Topica Erythromycin     Fibercon  Salicylic Acid      Metamucil         Miralax AVOID:        Senakot   Accutane    Cough:  Retin-A       Cough Drops  Tetracycline      Phenergan w/ Codeine if Rx  Minocycline      Robitussin (Plain & DM)  Antibiotics:     Crabs/Lice:  Ceclor       RID  Cephalosporins    AVOID:  E-Mycins      Kwell  Keflex  Macrobid/Macrodantin   Diarrhea:  Penicillin      Kao-Pectate  Zithromax      Imodium AD         PUSH FLUIDS AVOID:       Cipro     Fever:  Tetracycline      Tylenol (Regular or Extra  Minocycline       Strength)  Levaquin      Extra Strength-Do not          Exceed 8 tabs/24 hrs Caffeine:        <200mg/day (equiv. To 1 cup of coffee or  approx. 3 12 oz sodas)         Gas: Cold/Hayfever:       Gas-X  Benadryl      Mylicon  Claritin       Phazyme  **Claritin-D        Chlor-Trimeton    Headaches:  Dimetapp      ASA-Free Excedrin  Drixoral-Non-Drowsy     Cold Compress  Mucinex (Guaifenasin)     Tylenol (Regular or Extra  Sudafed/Sudafed-12 Hour     Strength)  **Sudafed PE Pseudoephedrine   Tylenol Cold & Sinus     Vicks Vapor Rub  Zyrtec  **AVOID if Problems With Blood Pressure         Heartburn: Avoid lying down for at least 1 hour after meals  Aciphex      Maalox     Rash:  Milk of Magnesia     Benadryl    Mylanta       1% Hydrocortisone Cream  Pepcid  Pepcid Complete   Sleep Aids:  Prevacid      Ambien   Prilosec       Benadryl  Rolaids       Chamomile Tea  Tums (Limit 4/day)     Unisom  Zantac       Tylenol PM         Warm milk-add vanilla or  Hemorrhoids:       Sugar for taste  Anusol/Anusol H.C.  (RX: Analapram 2.5%)  Sugar Substitutes:  Hydrocortisone OTC     Ok in moderation  Preparation H      Tucks        Vaseline lotion applied to tissue with  wiping    Herpes:     Throat:  Acyclovir      Oragel  Famvir  Valtrex     Vaccines:         Flu Shot Leg Cramps:       *Gardasil  Benadryl      Hepatitis A         Hepatitis B Nasal Spray:         Pneumovax  Saline Nasal Spray     Polio Booster         Tetanus Nausea:       Tuberculosis test or PPD  Vitamin B6 25 mg TID   AVOID:    Dramamine      *Gardasil  Emetrol       Live Poliovirus  Ginger Root 250 mg QID    MMR (measles, mumps &  High Complex Carbs @ Bedtime    rebella)  Sea Bands-Accupressure    Varicella (Chickenpox)  Unisom 1/2 tab TID     *No known complications           If received before Pain:         Known pregnancy;   Darvocet       Resume series after  Lortab        Delivery  Percocet    Yeast:   Tramadol      Femstat  Tylenol 3      Gyne-lotrimin  Ultram       Monistat  Vicodin           MISC:         All Sunscreens           Hair Coloring/highlights          Insect Repellant's          (Including DEET)         Mystic Tans Tdap Vaccine (Tetanus, Diphtheria and Pertussis): What You Need to Know 1. Why get vaccinated? Tetanus, diphtheria and pertussis are very serious diseases. Tdap vaccine can protect [place] from these diseases. And, Tdap vaccine given to pregnant women can protect newborn babies against pertussis.Marland Kitchen TETANUS (Lockjaw) is rare in the [place] today. It causes painful muscle tightening and stiffness, usually all over the body.  It can lead to tightening of muscles in the head and neck so you can't open your mouth, swallow, or sometimes even breathe. Tetanus kills about 1 out of 10 people who are infected even after receiving the best medical care. DIPHTHERIA is also rare in the [place] today. It can cause a thick coating to form in the back of the throat.  It can lead to breathing problems, heart failure, paralysis, and death. PERTUSSIS (Whooping Cough) causes severe coughing spells, which can cause difficulty breathing, vomiting and  disturbed sleep.  It can also lead to weight loss, incontinence, and rib fractures. Up to 2 in 100 adolescents and 5 in 100 adults with pertussis are hospitalized or have complications, which could include pneumonia or death. These diseases are caused by bacteria. Diphtheria and pertussis are spread from person to person through secretions from coughing or sneezing. Tetanus enters the body through cuts, scratches, or wounds. Before vaccines, as many as 200,000 cases of diphtheria, 200,000 cases of pertussis, and hundreds of cases of tetanus, were reported in the [place] each year. Since vaccination began, reports of cases for tetanus and diphtheria have dropped by about 99% and for pertussis by about 80%. 2. Tdap vaccine Tdap vaccine can protect adolescents and adults from tetanus, diphtheria, and pertussis. One dose of Tdap is routinely given at age 72 or 40. People who did not get Tdap at that age should get it as soon as possible. Tdap is especially important for healthcare professionals and anyone having close contact with a baby younger than 12 months. Pregnant women should get a dose of Tdap during every pregnancy, to protect the newborn from  pertussis. Infants are most at risk for severe, life-threatening complications from pertussis. Another vaccine, called Td, protects against tetanus and diphtheria, but not pertussis. A Td booster should be given every 10 years. Tdap may be given as one of these boosters if you have never gotten Tdap before. Tdap may also be given after a severe cut or burn to prevent tetanus infection. Your doctor or the person giving you the vaccine can give you more information. Tdap may safely be given at the same time as other vaccines. 3. Some people should not get this vaccine  A person who has ever had a life-threatening allergic reaction after a previous dose of any diphtheria, tetanus or pertussis containing vaccine, OR has a severe allergy to any part of this  vaccine, should not get Tdap vaccine. Tell the person giving the vaccine about any severe allergies.  Anyone who had coma or long repeated seizures within 7 days after a childhood dose of DTP or DTaP, or a previous dose of Tdap, should not get Tdap, unless a cause other than the vaccine was found. They can still get Td.  Talk to your doctor if you: ? have seizures or another nervous system problem, ? had severe pain or swelling after any vaccine containing diphtheria, tetanus or pertussis, ? ever had a condition called Guillain-Barr Syndrome (GBS), ? aren't feeling well on the day the shot is scheduled. 4. Risks With any medicine, including vaccines, there is a chance of side effects. These are usually mild and go away on their own. Serious reactions are also possible but are rare. Most people who get Tdap vaccine do not have any problems with it. Mild problems following Tdap (Did not interfere with activities)  Pain where the shot was given (about 3 in 4 adolescents or 2 in 3 adults)  Redness or swelling where the shot was given (about 1 person in 5)  Mild fever of at least 100.39F (up to about 1 in 25 adolescents or 1 in 100 adults)  Headache (about 3 or 4 people in 10)  Tiredness (about 1 person in 3 or 4)  Nausea, vomiting, diarrhea, stomach ache (up to 1 in 4 adolescents or 1 in 10 adults)  Chills, sore joints (about 1 person in 10)  Body aches (about 1 person in 3 or 4)  Rash, swollen glands (uncommon) Moderate problems following Tdap (Interfered with activities, but did not require medical attention)  Pain where the shot was given (up to 1 in 5 or 6)  Redness or swelling where the shot was given (up to about 1 in 16 adolescents or 1 in 12 adults)  Fever over 102F (about 1 in 100 adolescents or 1 in 250 adults)  Headache (about 1 in 7 adolescents or 1 in 10 adults)  Nausea, vomiting, diarrhea, stomach ache (up to 1 or 3 people in 100)  Swelling of the entire arm  where the shot was given (up to about 1 in 500). Severe problems following Tdap (Unable to perform usual activities; required medical attention)  Swelling, severe pain, bleeding and redness in the arm where the shot was given (rare). Problems that could happen after any vaccine:  People sometimes faint after a medical procedure, including vaccination. Sitting or lying down for about 15 minutes can help prevent fainting, and injuries caused by a fall. Tell your doctor if you feel dizzy, or have vision changes or ringing in the ears.  Some people get severe pain in the shoulder and have  difficulty moving the arm where a shot was given. This happens very rarely.  Any medication can cause a severe allergic reaction. Such reactions from a vaccine are very rare, estimated at fewer than 1 in a million doses, and would happen within a few minutes to a few hours after the vaccination. As with any medicine, there is a very remote chance of a vaccine causing a serious injury or death. The safety of vaccines is always being monitored. For more information, visit: http://www.aguilar.org/ 5. What if there is a serious problem? What should I look for?  Look for anything that concerns you, such as signs of a severe allergic reaction, very high fever, or unusual behavior. Signs of a severe allergic reaction can include hives, swelling of the face and throat, difficulty breathing, a fast heartbeat, dizziness, and weakness. These would usually start a few minutes to a few hours after the vaccination. What should I do?  If you think it is a severe allergic reaction or other emergency that can't wait, call 9-1-1 or get the person to the nearest hospital. Otherwise, call your doctor.  Afterward, the reaction should be reported to the Vaccine Adverse Event Reporting System (VAERS). Your doctor might file this report, or you can do it yourself through the VAERS web site at www.vaers.SamedayNews.es, or by calling  (254)377-4848. VAERS does not give medical advice. 6. The National Vaccine Injury Compensation Program The [employer] Vaccine Injury Compensation Program (VICP) is a federal program that was created to compensate people who may have been injured by certain vaccines. Persons who believe they may have been injured by a vaccine can learn about the program and about filing a claim by calling 626-497-1144 or visiting the [place] website at GoldCloset.com.ee. There is a time limit to file a claim for compensation. 7. How can I learn more?  Ask your doctor. He or she can give you the vaccine package insert or suggest other sources of information.  Call your local or state health department.  Contact the Centers for Disease Control and Prevention (CDC): ? Call (267)234-7724 (1-800-CDC-INFO) or ? Visit CDC's website at http://hunter.com/ Vaccine Information Statement Tdap Vaccine (02/23/2014) This information is not intended to replace advice given to you by your health care provider. Make sure you discuss any questions you have with your health care provider. Document Released: 06/17/2012 Document Revised: 08/04/2018 Document Reviewed: 08/04/2018 Elsevier Interactive Patient Education  2019 [employer].

## 2019-03-05 LAB — CBC
Hematocrit: 35.4 % (ref 34.0–46.6)
Hemoglobin: 11.8 g/dL (ref 11.1–15.9)
MCH: 27.1 pg (ref 26.6–33.0)
MCHC: 33.3 g/dL (ref 31.5–35.7)
MCV: 81 fL (ref 79–97)
Platelets: 234 10*3/uL (ref 150–450)
RBC: 4.35 x10E6/uL (ref 3.77–5.28)
RDW: 13.6 % (ref 11.7–15.4)
WBC: 9.2 10*3/uL (ref 3.4–10.8)

## 2019-03-05 LAB — THYROID PANEL WITH TSH
Free Thyroxine Index: 3.3 (ref 1.2–4.9)
T3 Uptake Ratio: 22 % — ABNORMAL LOW (ref 24–39)
T4, Total: 15.1 ug/dL — ABNORMAL HIGH (ref 4.5–12.0)
TSH: <0.006 u[IU]/mL — ABNORMAL LOW (ref 0.450–4.500)

## 2019-03-05 LAB — RPR: RPR Ser Ql: NONREACTIVE

## 2019-03-05 LAB — GLUCOSE, RANDOM: Glucose: 109 mg/dL — ABNORMAL HIGH (ref 65–99)

## 2019-03-09 LAB — DRUG PROFILE, UR, 9 DRUGS (LABCORP)
Amphetamines, Urine: NEGATIVE ng/mL
Barbiturate Quant, Ur: NEGATIVE ng/mL
Benzodiazepine Quant, Ur: NEGATIVE ng/mL
Cannabinoid Quant, Ur: POSITIVE — AB
Cocaine (Metab.): NEGATIVE ng/mL
Methadone Screen, Urine: NEGATIVE ng/mL
Opiate Quant, Ur: NEGATIVE ng/mL
PCP Quant, Ur: NEGATIVE ng/mL
Propoxyphene: NEGATIVE ng/mL

## 2019-03-09 NOTE — Telephone Encounter (Signed)
Please let her know that Jola Babinski and I are currently trying to work on how we will be able to help.

## 2019-03-10 ENCOUNTER — Inpatient Hospital Stay (HOSPITAL_COMMUNITY)
Admission: AD | Admit: 2019-03-10 | Discharge: 2019-03-10 | Disposition: A | Discharge status: Home / Self Care | Payer: Medicaid Other | Attending: Obstetrics and Gynecology | Admitting: Obstetrics and Gynecology

## 2019-03-10 ENCOUNTER — Encounter (HOSPITAL_COMMUNITY): Payer: Self-pay | Admitting: *Deleted

## 2019-03-10 ENCOUNTER — Telehealth: Payer: Self-pay | Admitting: Obstetrics and Gynecology

## 2019-03-10 DIAGNOSIS — Z880 Allergy status to penicillin: Secondary | ICD-10-CM | POA: Diagnosis not present

## 2019-03-10 DIAGNOSIS — J45909 Unspecified asthma, uncomplicated: Secondary | ICD-10-CM | POA: Diagnosis not present

## 2019-03-10 DIAGNOSIS — I252 Old myocardial infarction: Secondary | ICD-10-CM | POA: Diagnosis not present

## 2019-03-10 DIAGNOSIS — Z955 Presence of coronary angioplasty implant and graft: Secondary | ICD-10-CM | POA: Insufficient documentation

## 2019-03-10 DIAGNOSIS — O10919 Unspecified pre-existing hypertension complicating pregnancy, unspecified trimester: Secondary | ICD-10-CM

## 2019-03-10 DIAGNOSIS — O4693 Antepartum hemorrhage, unspecified, third trimester: Secondary | ICD-10-CM | POA: Insufficient documentation

## 2019-03-10 DIAGNOSIS — Z888 Allergy status to other drugs, medicaments and biological substances status: Secondary | ICD-10-CM | POA: Diagnosis not present

## 2019-03-10 DIAGNOSIS — Z3A29 29 weeks gestation of pregnancy: Secondary | ICD-10-CM | POA: Insufficient documentation

## 2019-03-10 DIAGNOSIS — Z7982 Long term (current) use of aspirin: Secondary | ICD-10-CM | POA: Diagnosis not present

## 2019-03-10 DIAGNOSIS — O163 Unspecified maternal hypertension, third trimester: Secondary | ICD-10-CM | POA: Insufficient documentation

## 2019-03-10 DIAGNOSIS — M546 Pain in thoracic spine: Secondary | ICD-10-CM | POA: Insufficient documentation

## 2019-03-10 DIAGNOSIS — Z885 Allergy status to narcotic agent status: Secondary | ICD-10-CM | POA: Insufficient documentation

## 2019-03-10 DIAGNOSIS — Z79899 Other long term (current) drug therapy: Secondary | ICD-10-CM | POA: Insufficient documentation

## 2019-03-10 DIAGNOSIS — O24419 Gestational diabetes mellitus in pregnancy, unspecified control: Secondary | ICD-10-CM | POA: Diagnosis not present

## 2019-03-10 DIAGNOSIS — O10913 Unspecified pre-existing hypertension complicating pregnancy, third trimester: Secondary | ICD-10-CM | POA: Diagnosis not present

## 2019-03-10 DIAGNOSIS — Z881 Allergy status to other antibiotic agents status: Secondary | ICD-10-CM | POA: Insufficient documentation

## 2019-03-10 DIAGNOSIS — O34219 Maternal care for unspecified type scar from previous cesarean delivery: Secondary | ICD-10-CM | POA: Insufficient documentation

## 2019-03-10 DIAGNOSIS — E0590 Thyrotoxicosis, unspecified without thyrotoxic crisis or storm: Secondary | ICD-10-CM | POA: Diagnosis not present

## 2019-03-10 DIAGNOSIS — Z98891 History of uterine scar from previous surgery: Secondary | ICD-10-CM

## 2019-03-10 DIAGNOSIS — Z8249 Family history of ischemic heart disease and other diseases of the circulatory system: Secondary | ICD-10-CM | POA: Insufficient documentation

## 2019-03-10 DIAGNOSIS — G629 Polyneuropathy, unspecified: Secondary | ICD-10-CM | POA: Diagnosis not present

## 2019-03-10 DIAGNOSIS — F17210 Nicotine dependence, cigarettes, uncomplicated: Secondary | ICD-10-CM | POA: Insufficient documentation

## 2019-03-10 DIAGNOSIS — O24913 Unspecified diabetes mellitus in pregnancy, third trimester: Secondary | ICD-10-CM

## 2019-03-10 LAB — COMPREHENSIVE METABOLIC PANEL
ALT: 10 U/L (ref 0–44)
AST: 14 U/L — ABNORMAL LOW (ref 15–41)
Albumin: 2.9 g/dL — ABNORMAL LOW (ref 3.5–5.0)
Alkaline Phosphatase: 165 U/L — ABNORMAL HIGH (ref 38–126)
Anion gap: 7 (ref 5–15)
BUN: 5 mg/dL — ABNORMAL LOW (ref 6–20)
CO2: 19 mmol/L — ABNORMAL LOW (ref 22–32)
CREATININE: 0.46 mg/dL (ref 0.44–1.00)
Calcium: 8.8 mg/dL — ABNORMAL LOW (ref 8.9–10.3)
Chloride: 110 mmol/L (ref 98–111)
GFR calc non Af Amer: >60 mL/min (ref >60)
Glucose, Bld: 83 mg/dL (ref 70–99)
Potassium: 3.9 mmol/L (ref 3.5–5.1)
Sodium: 136 mmol/L (ref 135–145)
Total Bilirubin: 0.2 mg/dL — ABNORMAL LOW (ref 0.3–1.2)
Total Protein: 6.2 g/dL — ABNORMAL LOW (ref 6.5–8.1)

## 2019-03-10 LAB — URINALYSIS, ROUTINE W REFLEX MICROSCOPIC
Bilirubin Urine: NEGATIVE
Glucose, UA: NEGATIVE mg/dL
Hgb urine dipstick: NEGATIVE
Ketones, ur: NEGATIVE mg/dL
Leukocytes,Ua: NEGATIVE
Nitrite: NEGATIVE
Protein, ur: NEGATIVE mg/dL
Specific Gravity, Urine: 1.019 (ref 1.005–1.030)
pH: 7 (ref 5.0–8.0)

## 2019-03-10 LAB — CBC
HCT: 34 % — ABNORMAL LOW (ref 36.0–46.0)
Hemoglobin: 11.2 g/dL — ABNORMAL LOW (ref 12.0–15.0)
MCH: 26.5 pg (ref 26.0–34.0)
MCHC: 32.9 g/dL (ref 30.0–36.0)
MCV: 80.6 fL (ref 80.0–100.0)
PLATELETS: 244 10*3/uL (ref 150–400)
RBC: 4.22 MIL/uL (ref 3.87–5.11)
RDW: 13.9 % (ref 11.5–15.5)
WBC: 10.6 10*3/uL — ABNORMAL HIGH (ref 4.0–10.5)
nRBC: 0 % (ref 0.0–0.2)

## 2019-03-10 LAB — PROTEIN / CREATININE RATIO, URINE
Creatinine, Urine: 125.64 mg/dL
Protein Creatinine Ratio: 0.08 mg/mg{Cre} (ref 0.00–0.15)
TOTAL PROTEIN, URINE: 10 mg/dL

## 2019-03-10 MED ORDER — ACETAMINOPHEN 325 MG PO TABS
650.0000 mg | ORAL_TABLET | Freq: Once | ORAL | Status: AC
  Administered 2019-03-10: 650 mg via ORAL
  Filled 2019-03-10: qty 2

## 2019-03-10 MED ORDER — TERCONAZOLE 0.4 % VA CREA: 1.0000 | TOPICAL_CREAM | Freq: Every day | VAGINAL | 0 refills | Status: DC

## 2019-03-10 NOTE — Telephone Encounter (Signed)
Patient called back and said she is going to the hospital.

## 2019-03-10 NOTE — MAU Provider Note (Addendum)
History     CSN: [id number]  Arrival date and time: 03/10/19 5643   First Provider Initiated Contact with Patient 03/10/19 1658      Chief Complaint  Patient presents with  . Vaginal Bleeding   HPI Michelle Osborn 33 y.o. [redacted]w[redacted]d [facility]. Baby has been moving well the entire time.  Has had some mid back pain on the right side and some pain down her right leg.  Additionally has has pressure in the lower abdomen that was worse earlier today.  At the current time, her pains have resolved.  Has not had any pain in her upper abdomen.  [facility] [place]  Has a complicated high risk medical history including previous C/S, chronic hypertension, MI with stent, diabetes - type 2, thyroid disease.  Client currently taking Labetalol 100 mg BID, aspirin 81 mg, and gummy PNV.  Discussed where she needs to get prenatal care - [place] situation is very [place] for her and her mother may make her move out.  There is no where for her to live currently.  Is very stressed and tearful.  [place] and she plans to be scheduled for repeat C/S at 36 weeks.   OB History    Gravida  2   Para  1   Term  1   Preterm      AB      Living  1     SAB      TAB      Ectopic      Multiple      Live Births  1           Past Medical History:  Diagnosis Date  . Asthma   . Diabetes mellitus   . Hypertension   . Hyperthyroidism   . [place]) [place]   . Neuropathy   . Tachycardia   . Thyroid disease     Past Surgical History:  Procedure Laterality Date  . ADENOIDECTOMY    . APPENDECTOMY    . [place]  . CESAREAN SE[place]    . CORONARY ANGIOPLASTY WITH  STENT PLACEMENT  03/2016  . DILATION AND CURETTAGE OF UTERUS    . OTHER SURGICAL HISTORY     sweat gland excision  . TONSILLECTOMY      Family History  Problem Relation Age of Onset  . Hypertension Mother   . Asthma Mother   . Diabetes Mother   . Hyperlipidemia Mother   . Heart disease Mother   . Heart failure Mother        Defib placement  . Hypertension Father   . Kidney disease Father   . Heart disease Father   . Cancer Maternal Aunt   . Cancer Maternal Grandmother     Social History   Tobacco Use  . Smoking status: Current Every Day Smoker    Packs/day: 0.50    Years: 10.00    Pack years: 5.00  . Smokeless tobacco: Never Used  Substance Use Topics  . Alcohol use: No  . Drug use: Yes    Types: Marijuana    Allergies:  Allergies  Allergen Reactions  .  Darvocet [Propoxyphene N-Acetaminophen] Anaphylaxis    Also makes her stomach hurt  . Peanut-Containing Drug Products Shortness Of Breath and Swelling  . Penicillins Shortness Of Breath and Swelling  . Cephalexin Hives and Itching  . Tramadol Nausea And Vomiting  . Toradol [Ketorolac Tromethamine] Rash    Red rash with bumps    Medications Prior to Admission  Medication Sig Dispense Refill Last Dose  . ACCU-CHEK FASTCLIX LANCETS MISC 1 Units by Percutaneous route 4 (four) times daily. 100 each 12 03/10/2019 at Unknown time  . aspirin 81 MG chewable tablet Chew 81 mg by mouth daily.   03/10/2019 at Unknown time  . Blood Glucose Monitoring Suppl (ACCU-CHEK GUIDE) w/Device KIT 1 Device by Does not apply route 4 (four) times daily. 1 kit 0 03/10/2019 at Unknown time  . glucose blood test strip Use as instructed 100 each 12 03/10/2019 at Unknown time  . labetalol (NORMODYNE) 100 MG tablet Take 1 tablet (100 mg total) by mouth 2 (two) times daily. 60 tablet 1 03/10/2019 at Unknown time  . loratadine (CLARITIN) 10 MG tablet Take 1 tablet (10 mg total) by mouth daily. 30 tablet 6 03/10/2019 at Unknown time  . albuterol  (PROVENTIL HFA;VENTOLIN HFA) 108 (90 Base) MCG/ACT inhaler Inhale 2 puffs into the lungs every 4 (four) hours as needed for wheezing or shortness of breath. 1 Inhaler 2 03/07/2019  . albuterol (PROVENTIL) (2.5 MG/3ML) 0.083% nebulizer solution Take 3 mLs (2.5 mg total) by nebulization every 6 (six) hours as needed for wheezing or shortness of breath. 75 mL 12 Unknown at Unknown time  . azithromycin (ZITHROMAX) 250 MG tablet Take as directed: Two pills by mouth the first day, then one pill every day until completed 6 tablet 1   . EPINEPHrine 0.3 mg/0.3 mL IJ SOAJ injection Inject into the muscle.   Taking  . Prenatal Vit-Fe Fumarate-FA (PRENATAL VITAMIN) 27-0.8 MG TABS Take by mouth daily.   03/08/2019  . Respiratory Therapy Supplies (NEBULIZER) DEVI 1 Device by Does not apply route every 4 (four) hours as needed. 1 each 0 Taking  . terconazole (TERAZOL 7) 0.4 % vaginal cream Place 1 applicator vaginally at bedtime for 7 days. 45 g 0     Review of Systems  Constitutional: Negative for fever.  Gastrointestinal: Negative for abdominal pain, nausea and vomiting.  Genitourinary: Positive for vaginal bleeding. Negative for dysuria.   Physical Exam   Blood pressure 129/79, pulse 99, temperature 98.3 F (36.8 C), resp. rate 17, height 5' 7" (1.702 m), weight 90.7 kg, last menstrual period 08/15/2018, SpO2 99 %, unknown if currently breastfeeding.  Physical Exam  Nursing note and vitals reviewed. Constitutional: She is oriented to person, place, and time. She appears well-developed and well-nourished.  HENT:  Head: Normocephalic.  Eyes: EOM are normal.  Neck: Neck supple.  GI: Soft. There is no abdominal tenderness. There is no rebound and no guarding.  FHT baseline is 140 with moderate variability.  No decelerations, no contractions 10x10 accels noted.  Reassuring strip for gestational age. No RUQ pain with palpation.  Genitourinary:    Genitourinary Comments: Speculum exam: External genitalia -  gray tint to tissue with some edema and wrinkling Vagina - Small amount of white discharge, no odor Cervix - Visually closed. No bleeding Bimanual exam: Cervix closed and thick Chaperone present for exam.    Musculoskeletal: Normal range of motion.  Neurological: She is alert and oriented to person, place, and time.  Skin: Skin is warm and dry.  Psychiatric: She has a normal mood and affect.   Vitals:   03/10/19 1920 03/10/19 1925 03/10/19 1930 03/10/19 1931  BP:    129/79  Pulse:    99  Resp:      Temp:      SpO2: 98% 99% 99%   Weight:      Height:        MAU Course  Procedures Results for orders placed or performed during the hospital encounter of 03/10/19 (from the past 24 hour(s))  Urinalysis, Routine w reflex microscopic     Status: None   Collection Time: 03/10/19  4:48 PM  Result Value Ref Range   Color, Urine YELLOW YELLOW   APPearance CLEAR CLEAR   Specific Gravity, Urine 1.019 1.005 - 1.030   pH 7.0 5.0 - 8.0   Glucose, UA NEGATIVE NEGATIVE mg/dL   Hgb urine dipstick NEGATIVE NEGATIVE   Bilirubin Urine NEGATIVE NEGATIVE   Ketones, ur NEGATIVE NEGATIVE mg/dL   Protein, ur NEGATIVE NEGATIVE mg/dL   Nitrite NEGATIVE NEGATIVE   Leukocytes,Ua NEGATIVE NEGATIVE  CBC     Status: Abnormal   Collection Time: 03/10/19  5:56 PM  Result Value Ref Range   WBC 10.6 (H) 4.0 - 10.5 K/uL   RBC 4.22 3.87 - 5.11 MIL/uL   Hemoglobin 11.2 (L) 12.0 - 15.0 g/dL   HCT 34.0 (L) 36.0 - 46.0 %   MCV 80.6 80.0 - 100.0 fL   MCH 26.5 26.0 - 34.0 pg   MCHC 32.9 30.0 - 36.0 g/dL   RDW 13.9 11.5 - 15.5 %   Platelets 244 150 - 400 K/uL   nRBC 0.0 0.0 - 0.2 %  Comprehensive metabolic panel     Status: Abnormal   Collection Time: 03/10/19  5:56 PM  Result Value Ref Range   Sodium 136 135 - 145 mmol/L   Potassium 3.9 3.5 - 5.1 mmol/L   Chloride 110 98 - 111 mmol/L   CO2 19 (L) 22 - 32 mmol/L   Glucose, Bld 83 70 - 99 mg/dL   BUN 5 (L) 6 - 20 mg/dL   Creatinine, Ser 0.46 0.44 - 1.00  mg/dL   Calcium 8.8 (L) 8.9 - 10.3 mg/dL   Total Protein 6.2 (L) 6.5 - 8.1 g/dL   Albumin 2.9 (L) 3.5 - 5.0 g/dL   AST 14 (L) 15 - 41 U/L   ALT 10 0 - 44 U/L   Alkaline Phosphatase 165 (H) 38 - 126 U/L   Total Bilirubin 0.2 (L) 0.3 - 1.2 mg/dL   GFR calc non Af Amer >60 >60 mL/min   GFR calc Af Amer >60 >60 mL/min   Anion gap 7 5 - 15  Protein / creatinine ratio, urine     Status: None   Collection Time: 03/10/19  7:00 PM  Result Value Ref Range   Creatinine, Urine 125.64 mg/dL   Total Protein, Urine 10 mg/dL   Protein Creatinine Ratio 0.08 0.00 - 0.15 mg/mg[Cre]    MDM No blood seen on speculum exam.  BPs today higher than in previous visits in prenatal care.  Consult with Dr. Kennon Rounds.  Will check preeclampsia labs. Care assumed by Dr. Kennon Rounds at 1800 Blood pressures have been well under 140/90 since labs were drawn to check for preeclampsia.  PC ratio is delayed for more than 2 hours - lost in lab - safety portal filled out. PC Ratio is normal.  Dr. Kennon Rounds in agreement for sending client home. Of Note:  Client  has recently treated for a yeast infection with Terazol vaginal cream.  In addition she has taken Azithromycin which may have kept the yeast infection from completely resolving.  Assessment and Plan  Vaginal bleeding at [redacted]w[redacted]d[facility] Gestational diabetic Hx of heart disease - MI with stent Previous C/S No preeclampsia - PR ratio 0.08 Persistent yeast infection - clinical diagnosis  Plan No bleeding at this time Strip is reassuring for baby [facility] Seek additional medical care if you develop headaches, blurred vision, edema or RUQ pain, or if you have additional vaginal bleeding.  Michelle Osborn 03/10/2019, 8:26 PM

## 2019-03-10 NOTE — Telephone Encounter (Signed)
She states she is having some spotting, no real bleeding, baby moving well, no leaking fluid, no headache or swelling.  Just wanted to ask what she needed to do, please advise, thanks.

## 2019-03-10 NOTE — MAU Note (Signed)
Arrives via EMS with complaint of noticing blood in toilet when she went to urinate. Patient also reports some blood in her underwear. Some pressure in lower abd.

## 2019-03-10 NOTE — Discharge Instructions (Signed)
Follow up in the office at [facility] as planned. Seek additional medical care if you develop headaches, blurred vision, edema or RUQ pain, or if you have additional vaginal bleeding.

## 2019-03-10 NOTE — MAU Note (Signed)
Pt put on her own BP cuff after getting up to restroom. She said it was "'puffy" when she put in on, resulting in erroneous Bps. Provider retook BP, was okay w/reading and patient was discharged.

## 2019-03-12 ENCOUNTER — Telehealth: Payer: Self-pay | Admitting: Obstetrics and Gynecology

## 2019-03-12 NOTE — Telephone Encounter (Signed)
The patient called and stated she lost her mucus plug a few mins ago, and is asking to speak to her provider/nurse, please advise, thanks.

## 2019-03-12 NOTE — Telephone Encounter (Signed)
Patient states that she is not having any pain, contractions, and is still feeling the baby move. I have spoke with Dr. Valentino Saxon about this. If patient starts having pain then can go to L&D.

## 2019-03-13 ENCOUNTER — Telehealth: Payer: Self-pay | Admitting: Obstetrics and Gynecology

## 2019-03-13 NOTE — Telephone Encounter (Signed)
OB 29/3   Lost mucous plug yesterday.   Small blood on underwear.  Pos FM. No pain. NO CTX.  Last IC 4 weeks ago.  N/V/D- slight nausea in the am.  Pos for eating and drinking.  NO UTI  Sx.  Pos for constipation.- NO new.   PT aware to monitor sx for now. Contact office for ctx or  Vb. Pt voices understanding.

## 2019-03-13 NOTE — Telephone Encounter (Signed)
Patient called stating she may have lost her mucous plug and having weird cramp like pains. She would like a call back. Thanks

## 2019-03-17 ENCOUNTER — Telehealth: Payer: Self-pay | Admitting: Obstetrics and Gynecology

## 2019-03-17 NOTE — Telephone Encounter (Signed)
Lm for patient to come office visit tomorrow. Per Dr. Valentino Saxon discharge summer should have appointment with Dr. Logan Bores,

## 2019-03-17 NOTE — Telephone Encounter (Signed)
The patient is asking if she needs to keep her 10 AM appointment tomorrow as she was just seen 2 weeks ago and will be back in for an [place] on 04/01/19 and see Dr. Valentino Saxon on 04/03/19, please advise, thanks.

## 2019-03-18 ENCOUNTER — Ambulatory Visit (INDEPENDENT_AMBULATORY_CARE_PROVIDER_SITE_OTHER): Payer: Medicaid Other | Admitting: Obstetrics and Gynecology

## 2019-03-18 ENCOUNTER — Other Ambulatory Visit: Payer: Self-pay

## 2019-03-18 ENCOUNTER — Encounter: Payer: Self-pay | Admitting: Obstetrics and Gynecology

## 2019-03-18 VITALS — BP 123/76 | HR 96 | Ht 67.0 in | Wt 200.9 lb

## 2019-03-18 DIAGNOSIS — O99282 Endocrine, nutritional and metabolic diseases complicating pregnancy, second trimester: Secondary | ICD-10-CM

## 2019-03-18 DIAGNOSIS — O0993 Supervision of high risk pregnancy, unspecified, third trimester: Secondary | ICD-10-CM

## 2019-03-18 DIAGNOSIS — O34219 Maternal care for unspecified type scar from previous cesarean delivery: Secondary | ICD-10-CM

## 2019-03-18 DIAGNOSIS — E0590 Thyrotoxicosis, unspecified without thyrotoxic crisis or storm: Secondary | ICD-10-CM

## 2019-03-18 DIAGNOSIS — Z98891 History of uterine scar from previous surgery: Secondary | ICD-10-CM

## 2019-03-18 LAB — POCT URINALYSIS DIPSTICK OB
Bilirubin, UA: NEGATIVE
Glucose, UA: NEGATIVE
Ketones, UA: NEGATIVE
Leukocytes, UA: NEGATIVE
Nitrite, UA: NEGATIVE
Spec Grav, UA: 1.01 (ref 1.010–1.025)
Urobilinogen, UA: 0.2 E.U./dL
pH, UA: 6.5 (ref 5.0–8.0)
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
## 2019-03-18 NOTE — Progress Notes (Signed)
Patient comes in today for [name] visit. Patient states that she is having some vaginal bleeding and is losing mucus.

## 2019-04-01 ENCOUNTER — Ambulatory Visit: Payer: Medicaid Other

## 2019-04-01 ENCOUNTER — Encounter: Payer: Medicaid Other | Admitting: Obstetrics and Gynecology

## 2019-04-01 ENCOUNTER — Other Ambulatory Visit: Payer: Medicaid Other

## 2019-04-03 ENCOUNTER — Encounter: Payer: Medicaid Other | Admitting: Obstetrics and Gynecology

## 2019-04-09 ENCOUNTER — Encounter: Payer: Self-pay | Admitting: Obstetrics and Gynecology

## 2019-04-09 ENCOUNTER — Ambulatory Visit (INDEPENDENT_AMBULATORY_CARE_PROVIDER_SITE_OTHER): Payer: Medicaid Other | Admitting: Obstetrics and Gynecology

## 2019-04-09 ENCOUNTER — Other Ambulatory Visit: Payer: Self-pay

## 2019-04-09 VITALS — BP 135/84 | HR 85 | Wt 204.1 lb

## 2019-04-09 DIAGNOSIS — O99283 Endocrine, nutritional and metabolic diseases complicating pregnancy, third trimester: Secondary | ICD-10-CM

## 2019-04-09 DIAGNOSIS — O24113 Pre-existing diabetes mellitus, type 2, in pregnancy, third trimester: Secondary | ICD-10-CM | POA: Diagnosis not present

## 2019-04-09 DIAGNOSIS — I10 Essential (primary) hypertension: Secondary | ICD-10-CM

## 2019-04-09 DIAGNOSIS — O0993 Supervision of high risk pregnancy, unspecified, third trimester: Secondary | ICD-10-CM

## 2019-04-09 DIAGNOSIS — O34219 Maternal care for unspecified type scar from previous cesarean delivery: Secondary | ICD-10-CM

## 2019-04-09 DIAGNOSIS — E0590 Thyrotoxicosis, unspecified without thyrotoxic crisis or storm: Secondary | ICD-10-CM

## 2019-04-09 DIAGNOSIS — I252 Old myocardial infarction: Secondary | ICD-10-CM

## 2019-04-09 DIAGNOSIS — Z98891 History of uterine scar from previous surgery: Secondary | ICD-10-CM

## 2019-04-09 LAB — POCT URINALYSIS DIPSTICK OB
Bilirubin, UA: NEGATIVE
Blood, UA: NEGATIVE
Glucose, UA: NEGATIVE
Ketones, UA: NEGATIVE
Leukocytes, UA: NEGATIVE
Nitrite, UA: NEGATIVE
Spec Grav, UA: 1.02 (ref 1.010–1.025)
Urobilinogen, UA: 0.2 E.U./dL
pH, UA: 6 (ref 5.0–8.0)

## 2019-04-09 NOTE — Progress Notes (Signed)
[name]-Pt is present today for prenatal care. Pt stated that she is having some pain and pressure in the vaginal area. Pt stated that she is not having any vaginal bleeding, but felt some contractions off and on but nothing regular.

## 2019-04-09 NOTE — Progress Notes (Signed)
[name]: Patient doing ok.  Notes death of her ex-husband (had been in a relationship for 16 years) last week as reason for missing her appointment. Had to go to [place] for settling his estate. Notes she is grieving but is coping well. Patient notes compliance with her blood pressure meds and thyroid medications (although she does occasionally miss a dose). Is not checking her blood sugars regularly as she notes she forgets. Will repeat thyroid panel and random glucose today.  Discussion had regarding plans for delivery.  She states that she is supposed to be delivered at 36 weeks, however review of MFM chart notes 39 weeks unless indicated by medical comorbidities. As patient has CHTN (well controlled), and pre-existing DM (no meds, however is not compliant with glucose monitoring), and thyroid disease (currently uncontrolled), I believe she could safely be delivered after 38 weeks. Patient ok with plan. Also discussed location of delivery, as MFM notes preferred location at tertiary center due to other medical history of MI, however patient with good cardiac output and no major concerns by Cardiology, and has been asymptomatic this pregnancy, along with several social factors (including issues with housing and transportation), we can consider delivery at [facility] with cardiac monitoring intrapartum and postpartum as indicated.  Discussed plan with patient, who notes that she would prefer to deliver here at [facility].  Will schedule repeat C-section.To begin antenatal testing today, will f/u weekly for NSTs. Due for growth scan, will order for next week, and perform BPP in lieu of NST for that visit. RTC in 2 weeks.    NONSTRESS TEST INTERPRETATION  INDICATIONS: Hyperthyroidism, history of pre-existing DM (no meds) and Chronic hypertension  FHR baseline: 140 bpm RESULTS:Reactive COMMENTS: no contractions present.    PLAN: 1. Continue fetal kick counts twice a day. 2. Continue antepartum testing as  scheduled-Biweekly

## 2019-04-10 LAB — THYROID PANEL WITH TSH
Free Thyroxine Index: 3.5 (ref 1.2–4.9)
T3 Uptake Ratio: 24 % (ref 24–39)
T4, Total: 14.4 ug/dL — ABNORMAL HIGH (ref 4.5–12.0)
TSH: <0.006 u[IU]/mL — ABNORMAL LOW (ref 0.450–4.500)

## 2019-04-10 LAB — GLUCOSE, RANDOM: Glucose: 84 mg/dL (ref 65–99)

## 2019-04-13 ENCOUNTER — Other Ambulatory Visit: Payer: Self-pay

## 2019-04-13 ENCOUNTER — Telehealth: Payer: Self-pay

## 2019-04-13 MED ORDER — PROPYLTHIOURACIL 50 MG PO TABS: 100.0000 mg | ORAL_TABLET | Freq: Three times a day (TID) | ORAL | 6 refills | Status: DC

## 2019-04-13 NOTE — Telephone Encounter (Signed)
Coronavirus (COVID-19) Are you at risk?  Are you at risk for the Coronavirus (COVID-19)?  To be considered HIGH RISK for Coronavirus (COVID-19), you have to meet the following criteria:  . Traveled to [place], [place], [place], [place] or [place]; or in the [place] to [place], [place], [place], or [place]; and have fever, cough, and shortness of breath within the last 2 weeks of travel OR . Been in close contact with a person diagnosed with COVID-19 within the last 2 weeks and have fever, cough, and shortness of breath . IF YOU DO NOT MEET THESE CRITERIA, YOU ARE CONSIDERED LOW RISK FOR COVID-19.  What to do if you are HIGH RISK for COVID-19?  . If you are having a medical emergency, call 911. . Seek medical care right away. Before you go to a doctor's office, urgent care or emergency department, call ahead and tell them about your recent travel, contact with someone diagnosed with COVID-19, and your symptoms. You should receive instructions from your physician's office regarding next steps of care.  . When you arrive at healthcare provider, tell the healthcare staff immediately you have returned from visiting [place], [place], [place], [place] or [place]; or traveled in the [place] to [place], [place], [place], or [place]; in the last two weeks or you have been in close contact with a person diagnosed with COVID-19 in the last 2 weeks.   . Tell the health care staff about your symptoms: fever, cough and shortness of breath. . After you have been seen by a medical provider, you will be either: o Tested for (COVID-19) and discharged home on quarantine except to seek medical care if symptoms worsen, and asked to  - Stay home and avoid contact with others until you get your results (4-5 days)  - Avoid travel on public transportation if possible (such as bus, train, or airplane) or o Sent to the Emergency Department by EMS for evaluation, COVID-19 testing, and possible  admission depending on your condition and test results.  What to do if you are LOW RISK for COVID-19?  Reduce your risk of any infection by using the same precautions used for avoiding the common cold or flu:  . Wash your hands often with soap and warm water for at least 20 seconds.  If soap and water are not readily available, use an alcohol-based hand sanitizer with at least 60% alcohol.  . If coughing or sneezing, cover your mouth and nose by coughing or sneezing into the elbow areas of your shirt or coat, into a tissue or into your sleeve (not your hands). . Avoid shaking hands with others and consider head nods or verbal greetings only. . Avoid touching your eyes, nose, or mouth with unwashed hands.  . Avoid close contact with people who are sick. . Avoid places or events with large numbers of people in one location, like concerts or sporting events. . Carefully consider travel plans you have or are making. . If you are planning any travel outside or inside the US, visit the CDC's Travelers' Health webpage for the latest health notices. . If you have some symptoms but not all symptoms, continue to monitor at home and seek medical attention if your symptoms worsen. . If you are having a medical emergency, call 911.   ADDITIONAL HEALTHCARE OPTIONS FOR PATIENTS  [facility] / e-Visit: [email or web address]         [facility]: [phone]  [facility]: [phone]                   [facility]: [phone]  Presceened- neg. cm

## 2019-04-14 ENCOUNTER — Other Ambulatory Visit: Payer: Medicaid Other

## 2019-04-23 ENCOUNTER — Telehealth: Payer: Self-pay

## 2019-04-23 NOTE — Telephone Encounter (Signed)
Pt is aware of the no visitor policy. Pt denies having any symptoms of COVID-19.

## 2019-04-24 ENCOUNTER — Other Ambulatory Visit: Payer: Self-pay

## 2019-04-24 ENCOUNTER — Ambulatory Visit (INDEPENDENT_AMBULATORY_CARE_PROVIDER_SITE_OTHER): Payer: Medicaid Other | Admitting: Obstetrics and Gynecology

## 2019-04-24 ENCOUNTER — Other Ambulatory Visit: Payer: Medicaid Other

## 2019-04-24 VITALS — BP 101/64 | HR 92 | Ht 67.0 in | Wt 203.8 lb

## 2019-04-24 DIAGNOSIS — O0993 Supervision of high risk pregnancy, unspecified, third trimester: Secondary | ICD-10-CM

## 2019-04-24 DIAGNOSIS — Z98891 History of uterine scar from previous surgery: Secondary | ICD-10-CM

## 2019-04-24 DIAGNOSIS — O24913 Unspecified diabetes mellitus in pregnancy, third trimester: Secondary | ICD-10-CM

## 2019-04-24 DIAGNOSIS — O10919 Unspecified pre-existing hypertension complicating pregnancy, unspecified trimester: Secondary | ICD-10-CM

## 2019-04-24 DIAGNOSIS — E0590 Thyrotoxicosis, unspecified without thyrotoxic crisis or storm: Secondary | ICD-10-CM

## 2019-04-24 LAB — POCT URINALYSIS DIPSTICK OB
Bilirubin, UA: NEGATIVE
Glucose, UA: NEGATIVE
Leukocytes, UA: NEGATIVE
Nitrite, UA: NEGATIVE
Spec Grav, UA: 1.01 (ref 1.010–1.025)
Urobilinogen, UA: 0.2 E.U./dL
pH, UA: 7 (ref 5.0–8.0)

## 2019-04-24 NOTE — Progress Notes (Signed)
Patient comes in today for [name] visit. Patient states that she is having vaginal and lower back pain.

## 2019-04-24 NOTE — Progress Notes (Signed)
[name]: Patient noncompliant with sugar testing.  Discussed compliance.  Hemoglobin A1c today.  NST reactive.  Patient specifically requesting cesarean delivery at [facility]. (Scheduled for 5-13 - 38wks) Blood pressure and thyroid currently controlled.  COVID-19 discussed

## 2019-04-25 LAB — HEMOGLOBIN A1C
Est. average glucose Bld gHb Est-mCnc: 105 mg/dL
Hgb A1c MFr Bld: 5.3 % (ref 4.8–5.6)

## 2019-04-28 ENCOUNTER — Telehealth: Payer: Self-pay

## 2019-04-28 NOTE — Telephone Encounter (Signed)
Coronavirus (COVID-19) Are you at risk?  Are you at risk for the Coronavirus (COVID-19)?  To be considered HIGH RISK for Coronavirus (COVID-19), you have to meet the following criteria:  . Traveled to [place], [place], [place], [place] or [place]; or in the [place] to [place], [place], [place], or [place]; and have fever, cough, and shortness of breath within the last 2 weeks of travel OR . Been in close contact with a person diagnosed with COVID-19 within the last 2 weeks and have fever, cough, and shortness of breath . IF YOU DO NOT MEET THESE CRITERIA, YOU ARE CONSIDERED LOW RISK FOR COVID-19.  What to do if you are HIGH RISK for COVID-19?  . If you are having a medical emergency, call 911. . Seek medical care right away. Before you go to a doctor's office, urgent care or emergency department, call ahead and tell them about your recent travel, contact with someone diagnosed with COVID-19, and your symptoms. You should receive instructions from your physician's office regarding next steps of care.  . When you arrive at healthcare provider, tell the healthcare staff immediately you have returned from visiting [place], [place], [place], [place] or [place]; or traveled in the [place] to [place], [place], [place], or [place]; in the last two weeks or you have been in close contact with a person diagnosed with COVID-19 in the last 2 weeks.   . Tell the health care staff about your symptoms: fever, cough and shortness of breath. . After you have been seen by a medical provider, you will be either: o Tested for (COVID-19) and discharged home on quarantine except to seek medical care if symptoms worsen, and asked to  - Stay home and avoid contact with others until you get your results (4-5 days)  - Avoid travel on public transportation if possible (such as bus, train, or airplane) or o Sent to the Emergency Department by EMS for evaluation, COVID-19 testing, and possible  admission depending on your condition and test results.  What to do if you are LOW RISK for COVID-19?  Reduce your risk of any infection by using the same precautions used for avoiding the common cold or flu:  . Wash your hands often with soap and warm water for at least 20 seconds.  If soap and water are not readily available, use an alcohol-based hand sanitizer with at least 60% alcohol.  . If coughing or sneezing, cover your mouth and nose by coughing or sneezing into the elbow areas of your shirt or coat, into a tissue or into your sleeve (not your hands). . Avoid shaking hands with others and consider head nods or verbal greetings only. . Avoid touching your eyes, nose, or mouth with unwashed hands.  . Avoid close contact with people who are sick. . Avoid places or events with large numbers of people in one location, like concerts or sporting events. . Carefully consider travel plans you have or are making. . If you are planning any travel outside or inside the [place], visit the CDC's Travelers' Health webpage for the latest health notices. . If you have some symptoms but not all symptoms, continue to monitor at home and seek medical attention if your symptoms worsen. . If you are having a medical emergency, call 911.   ADDITIONAL HEALTHCARE OPTIONS FOR PATIENTS  [facility] / e-Visit: [email or web address]         [facility]: [phone]  [facility]: [phone]                   [facility]: [phone]   Prescreened. Neg .cm 

## 2019-04-29 ENCOUNTER — Ambulatory Visit (INDEPENDENT_AMBULATORY_CARE_PROVIDER_SITE_OTHER): Payer: Medicaid Other

## 2019-04-29 ENCOUNTER — Encounter: Payer: Self-pay | Admitting: Obstetrics and Gynecology

## 2019-04-29 ENCOUNTER — Ambulatory Visit (INDEPENDENT_AMBULATORY_CARE_PROVIDER_SITE_OTHER): Payer: Medicaid Other | Admitting: Obstetrics and Gynecology

## 2019-04-29 ENCOUNTER — Other Ambulatory Visit: Payer: Self-pay

## 2019-04-29 VITALS — BP 136/84 | HR 90 | Wt 203.6 lb

## 2019-04-29 DIAGNOSIS — I10 Essential (primary) hypertension: Secondary | ICD-10-CM | POA: Diagnosis not present

## 2019-04-29 DIAGNOSIS — O99282 Endocrine, nutritional and metabolic diseases complicating pregnancy, second trimester: Secondary | ICD-10-CM

## 2019-04-29 DIAGNOSIS — E0590 Thyrotoxicosis, unspecified without thyrotoxic crisis or storm: Secondary | ICD-10-CM

## 2019-04-29 DIAGNOSIS — Z98891 History of uterine scar from previous surgery: Secondary | ICD-10-CM

## 2019-04-29 DIAGNOSIS — I252 Old myocardial infarction: Secondary | ICD-10-CM

## 2019-04-29 DIAGNOSIS — O24113 Pre-existing diabetes mellitus, type 2, in pregnancy, third trimester: Secondary | ICD-10-CM | POA: Diagnosis not present

## 2019-04-29 DIAGNOSIS — O0993 Supervision of high risk pregnancy, unspecified, third trimester: Secondary | ICD-10-CM

## 2019-04-29 DIAGNOSIS — O10919 Unspecified pre-existing hypertension complicating pregnancy, unspecified trimester: Secondary | ICD-10-CM

## 2019-04-29 DIAGNOSIS — O24913 Unspecified diabetes mellitus in pregnancy, third trimester: Secondary | ICD-10-CM

## 2019-04-29 DIAGNOSIS — O1210 Gestational proteinuria, unspecified trimester: Secondary | ICD-10-CM

## 2019-04-29 LAB — POCT URINALYSIS DIPSTICK OB
Bilirubin, UA: NEGATIVE
Blood, UA: NEGATIVE
Glucose, UA: NEGATIVE
Ketones, UA: NEGATIVE
Leukocytes, UA: NEGATIVE
Nitrite, UA: NEGATIVE
Spec Grav, UA: <=1.005 — AB (ref 1.010–1.025)
Urobilinogen, UA: 0.2 E.U./dL
pH, UA: 6.5 (ref 5.0–8.0)

## 2019-04-29 LAB — OB RESULTS CONSOLE GBS: GBS: POSITIVE

## 2019-04-29 LAB — OB RESULTS CONSOLE GC/CHLAMYDIA: Gonorrhea: NEGATIVE

## 2019-04-29 NOTE — Progress Notes (Signed)
[name]: Patient presents for OB appointment. She complains of chipping a tooth when eating 2 days ago. Has a dental appointment set for tomorrow. Notes decreased fetal movement x 2 days.  BPP noted 8/8 today, given reassurance.  Growth scan today noting growth at 10%ile today, with normal AFI.  C-section is scheduled in 2 weeks. Patient states that she is currently residing in [place] with her cousin due to having housing issues here in [place] and limited family resources.  Notes that she may difficulties getting to her C-section at 7:30 and inquires if she can come in the evening before, or have her C-section moved to later.  Can inquire, but patient can likely report to the hospital at midnight the day of her scheduled surgery.  Also noting protein in urine today, will order PIH labs in light of patient's h/o cHTN. RTC next week for visit and NST.

## 2019-04-29 NOTE — Progress Notes (Signed)
[name]-Pt present today for 36 week cultures and prenatal care. Pt stated that she was doing well but noticed that the baby is not moving as much as he has been.

## 2019-04-30 LAB — CBC
Hematocrit: 37.6 % (ref 34.0–46.6)
Hemoglobin: 12.3 g/dL (ref 11.1–15.9)
MCH: 26.9 pg (ref 26.6–33.0)
MCHC: 32.7 g/dL (ref 31.5–35.7)
MCV: 82 fL (ref 79–97)
Platelets: 222 10*3/uL (ref 150–450)
RBC: 4.57 x10E6/uL (ref 3.77–5.28)
RDW: 13.8 % (ref 11.7–15.4)
WBC: 9.1 10*3/uL (ref 3.4–10.8)

## 2019-04-30 LAB — COMPREHENSIVE METABOLIC PANEL
ALT: 9 IU/L (ref 0–32)
AST: 9 IU/L (ref 0–40)
Albumin/Globulin Ratio: 1.2 (ref 1.2–2.2)
Albumin: 3.5 g/dL — ABNORMAL LOW (ref 3.8–4.8)
Alkaline Phosphatase: 223 IU/L — ABNORMAL HIGH (ref 39–117)
BUN/Creatinine Ratio: 11 (ref 9–23)
BUN: 6 mg/dL (ref 6–20)
Bilirubin Total: 0.4 mg/dL (ref 0.0–1.2)
CO2: 17 mmol/L — ABNORMAL LOW (ref 20–29)
Calcium: 9 mg/dL (ref 8.7–10.2)
Chloride: 109 mmol/L — ABNORMAL HIGH (ref 96–106)
Creatinine, Ser: 0.56 mg/dL — ABNORMAL LOW (ref 0.57–1.00)
GFR calc Af Amer: 143 mL/min/{1.73_m2} (ref >59)
GFR calc non Af Amer: 124 mL/min/{1.73_m2} (ref >59)
Globulin, Total: 2.9 g/dL (ref 1.5–4.5)
Glucose: 71 mg/dL (ref 65–99)
Potassium: 4.2 mmol/L (ref 3.5–5.2)
Sodium: 141 mmol/L (ref 134–144)
Total Protein: 6.4 g/dL (ref 6.0–8.5)

## 2019-04-30 LAB — PROTEIN / CREATININE RATIO, URINE
Creatinine, Urine: 59.7 mg/dL
Protein, Ur: 77 mg/dL
Protein/Creat Ratio: 1290 mg/g creat — ABNORMAL HIGH (ref 0–200)

## 2019-05-01 ENCOUNTER — Encounter: Payer: Self-pay | Admitting: Obstetrics and Gynecology

## 2019-05-01 ENCOUNTER — Other Ambulatory Visit: Payer: Self-pay | Admitting: Obstetrics and Gynecology

## 2019-05-01 DIAGNOSIS — O119 Pre-existing hypertension with pre-eclampsia, unspecified trimester: Secondary | ICD-10-CM | POA: Insufficient documentation

## 2019-05-01 DIAGNOSIS — Z8759 Personal history of other complications of pregnancy, childbirth and the puerperium: Secondary | ICD-10-CM | POA: Insufficient documentation

## 2019-05-02 LAB — GC/CHLAMYDIA PROBE AMP
Chlamydia trachomatis, NAA: NEGATIVE
Neisseria Gonorrhoeae by PCR: NEGATIVE

## 2019-05-04 LAB — STREP GP B SUSCEPTIBILITY

## 2019-05-04 LAB — STREP GP B NAA+RFLX: Strep Gp B NAA+Rflx: POSITIVE — AB

## 2019-05-04 MED ORDER — CLINDAMYCIN PHOSPHATE 900 MG/50ML IV SOLN: 900.0000 mg | INTRAVENOUS | Status: DC

## 2019-05-04 MED ORDER — GENTAMICIN SULFATE 40 MG/ML IJ SOLN
5.0000 mg/kg | INTRAVENOUS | Status: DC
  Filled 2019-05-04: qty 11.5

## 2019-05-05 ENCOUNTER — Encounter: Payer: Self-pay | Admitting: *Deleted

## 2019-05-05 ENCOUNTER — Inpatient Hospital Stay
Admission: EM | Admit: 2019-05-05 | Discharge: 2019-05-07 | DRG: 788 | Disposition: A | Discharge status: Home / Self Care | Payer: Medicaid Other | Attending: Obstetrics and Gynecology | Admitting: Obstetrics and Gynecology

## 2019-05-05 ENCOUNTER — Other Ambulatory Visit: Payer: Self-pay

## 2019-05-05 ENCOUNTER — Encounter
Admission: EM | Disposition: A | Discharge status: Home / Self Care | Payer: Self-pay | Source: Home / Self Care | Attending: Obstetrics and Gynecology

## 2019-05-05 ENCOUNTER — Inpatient Hospital Stay: Payer: Medicaid Other | Admitting: Certified Registered Nurse Anesthetist

## 2019-05-05 ENCOUNTER — Inpatient Hospital Stay
Admission: RE | Admit: 2019-05-05 | Payer: Medicaid Other | Source: Home / Self Care | Admitting: Obstetrics and Gynecology

## 2019-05-05 DIAGNOSIS — O9952 Diseases of the respiratory system complicating childbirth: Secondary | ICD-10-CM | POA: Diagnosis present

## 2019-05-05 DIAGNOSIS — O114 Pre-existing hypertension with pre-eclampsia, complicating childbirth: Principal | ICD-10-CM | POA: Diagnosis present

## 2019-05-05 DIAGNOSIS — O119 Pre-existing hypertension with pre-eclampsia, unspecified trimester: Secondary | ICD-10-CM | POA: Diagnosis present

## 2019-05-05 DIAGNOSIS — O34211 Maternal care for low transverse scar from previous cesarean delivery: Secondary | ICD-10-CM | POA: Diagnosis present

## 2019-05-05 DIAGNOSIS — I25118 Atherosclerotic heart disease of native coronary artery with other forms of angina pectoris: Secondary | ICD-10-CM | POA: Diagnosis present

## 2019-05-05 DIAGNOSIS — I252 Old myocardial infarction: Secondary | ICD-10-CM

## 2019-05-05 DIAGNOSIS — O99334 Smoking (tobacco) complicating childbirth: Secondary | ICD-10-CM | POA: Diagnosis present

## 2019-05-05 DIAGNOSIS — F53 Postpartum depression: Secondary | ICD-10-CM | POA: Diagnosis not present

## 2019-05-05 DIAGNOSIS — Z3A37 37 weeks gestation of pregnancy: Secondary | ICD-10-CM

## 2019-05-05 DIAGNOSIS — E0590 Thyrotoxicosis, unspecified without thyrotoxic crisis or storm: Secondary | ICD-10-CM | POA: Diagnosis present

## 2019-05-05 DIAGNOSIS — J4520 Mild intermittent asthma, uncomplicated: Secondary | ICD-10-CM | POA: Diagnosis present

## 2019-05-05 DIAGNOSIS — O1002 Pre-existing essential hypertension complicating childbirth: Secondary | ICD-10-CM | POA: Diagnosis present

## 2019-05-05 DIAGNOSIS — O99345 Other mental disorders complicating the puerperium: Secondary | ICD-10-CM | POA: Diagnosis not present

## 2019-05-05 DIAGNOSIS — O24429 Gestational diabetes mellitus in childbirth, unspecified control: Secondary | ICD-10-CM | POA: Diagnosis present

## 2019-05-05 DIAGNOSIS — O99284 Endocrine, nutritional and metabolic diseases complicating childbirth: Secondary | ICD-10-CM | POA: Diagnosis present

## 2019-05-05 DIAGNOSIS — Z77011 Contact with and (suspected) exposure to lead: Secondary | ICD-10-CM | POA: Diagnosis present

## 2019-05-05 DIAGNOSIS — O1092 Unspecified pre-existing hypertension complicating childbirth: Secondary | ICD-10-CM

## 2019-05-05 DIAGNOSIS — Z8759 Personal history of other complications of pregnancy, childbirth and the puerperium: Secondary | ICD-10-CM | POA: Diagnosis present

## 2019-05-05 LAB — URINE DRUG SCREEN, QUALITATIVE (ARMC ONLY)
Amphetamines, Ur Screen: NOT DETECTED
Barbiturates, Ur Screen: NOT DETECTED
Benzodiazepine, Ur Scrn: NOT DETECTED
Cannabinoid 50 Ng, Ur ~~LOC~~: POSITIVE — AB
Cocaine Metabolite,Ur ~~LOC~~: NOT DETECTED
MDMA (Ecstasy)Ur Screen: NOT DETECTED
Methadone Scn, Ur: NOT DETECTED
Opiate, Ur Screen: NOT DETECTED
Phencyclidine (PCP) Ur S: NOT DETECTED
Tricyclic, Ur Screen: NOT DETECTED

## 2019-05-05 LAB — COMPREHENSIVE METABOLIC PANEL
ALT: 8 U/L (ref 0–44)
AST: 12 U/L — ABNORMAL LOW (ref 15–41)
Albumin: 3.1 g/dL — ABNORMAL LOW (ref 3.5–5.0)
Alkaline Phosphatase: 208 U/L — ABNORMAL HIGH (ref 38–126)
Anion gap: 6 (ref 5–15)
BUN: 11 mg/dL (ref 6–20)
CO2: 20 mmol/L — ABNORMAL LOW (ref 22–32)
Calcium: 8.6 mg/dL — ABNORMAL LOW (ref 8.9–10.3)
Chloride: 111 mmol/L (ref 98–111)
Creatinine, Ser: 0.57 mg/dL (ref 0.44–1.00)
GFR calc Af Amer: >60 mL/min (ref >60)
GFR calc non Af Amer: >60 mL/min (ref >60)
Glucose, Bld: 84 mg/dL (ref 70–99)
Potassium: 4.3 mmol/L (ref 3.5–5.1)
Sodium: 137 mmol/L (ref 135–145)
Total Bilirubin: 0.1 mg/dL — ABNORMAL LOW (ref 0.3–1.2)
Total Protein: 6.5 g/dL (ref 6.5–8.1)

## 2019-05-05 LAB — CBC
HCT: 34.3 % — ABNORMAL LOW (ref 36.0–46.0)
Hemoglobin: 11.7 g/dL — ABNORMAL LOW (ref 12.0–15.0)
MCH: 28.3 pg (ref 26.0–34.0)
MCHC: 34.1 g/dL (ref 30.0–36.0)
MCV: 82.9 fL (ref 80.0–100.0)
Platelets: 210 10*3/uL (ref 150–400)
RBC: 4.14 MIL/uL (ref 3.87–5.11)
RDW: 14.6 % (ref 11.5–15.5)
WBC: 10.1 10*3/uL (ref 4.0–10.5)
nRBC: 0 % (ref 0.0–0.2)

## 2019-05-05 LAB — ABO/RH: ABO/RH(D): O POS

## 2019-05-05 LAB — GLUCOSE, CAPILLARY
Glucose-Capillary: 138 mg/dL — ABNORMAL HIGH (ref 70–99)
Glucose-Capillary: 136 mg/dL — ABNORMAL HIGH (ref 70–99)

## 2019-05-05 LAB — TYPE AND SCREEN
ABO/RH(D): O POS
Antibody Screen: NEGATIVE

## 2019-05-05 LAB — RAPID HIV SCREEN (HIV 1/2 AB+AG)
HIV 1/2 Antibodies: NONREACTIVE
HIV-1 P24 Antigen - HIV24: NONREACTIVE

## 2019-05-05 SURGERY — Surgical Case
Anesthesia: Spinal

## 2019-05-05 MED ORDER — IBUPROFEN 800 MG PO TABS: 800.0000 mg | ORAL_TABLET | Freq: Three times a day (TID) | ORAL | Status: DC

## 2019-05-05 MED ORDER — NALOXONE HCL 0.4 MG/ML IJ SOLN: 0.4000 mg | INTRAMUSCULAR | Status: DC | PRN

## 2019-05-05 MED ORDER — LIDOCAINE 5 % EX PTCH
MEDICATED_PATCH | CUTANEOUS | Status: DC | PRN
  Administered 2019-05-05: 1 via TRANSDERMAL

## 2019-05-05 MED ORDER — CLINDAMYCIN PHOSPHATE 900 MG/50ML IV SOLN
900.0000 mg | Freq: Three times a day (TID) | INTRAVENOUS | Status: DC
  Administered 2019-05-05: 08:00:00 900 mg via INTRAVENOUS
  Filled 2019-05-05 (×3): qty 50

## 2019-05-05 MED ORDER — SODIUM CHLORIDE 0.9% FLUSH: 3.0000 mL | INTRAVENOUS | Status: DC | PRN

## 2019-05-05 MED ORDER — MORPHINE SULFATE (PF) 0.5 MG/ML IJ SOLN
INTRAMUSCULAR | Status: DC | PRN
  Administered 2019-05-05: .1 mg via INTRATHECAL

## 2019-05-05 MED ORDER — ENOXAPARIN SODIUM 40 MG/0.4ML ~~LOC~~ SOLN
40.0000 mg | SUBCUTANEOUS | Status: AC
  Administered 2019-05-05: 40 mg via SUBCUTANEOUS
  Filled 2019-05-05: qty 0.4

## 2019-05-05 MED ORDER — SENNOSIDES-DOCUSATE SODIUM 8.6-50 MG PO TABS
2.0000 | ORAL_TABLET | ORAL | Status: DC
  Administered 2019-05-06 – 2019-05-07 (×2): 2 via ORAL
  Filled 2019-05-05 (×2): qty 2

## 2019-05-05 MED ORDER — OXYCODONE HCL 5 MG PO TABS: 5.0000 mg | ORAL_TABLET | ORAL | Status: DC | PRN

## 2019-05-05 MED ORDER — DIPHENHYDRAMINE HCL 50 MG/ML IJ SOLN: 12.5000 mg | INTRAMUSCULAR | Status: DC | PRN

## 2019-05-05 MED ORDER — ONDANSETRON HCL 4 MG/2ML IJ SOLN
INTRAMUSCULAR | Status: AC
  Filled 2019-05-05: qty 2

## 2019-05-05 MED ORDER — OXYCODONE HCL 5 MG PO TABS
10.0000 mg | ORAL_TABLET | ORAL | Status: DC | PRN
  Administered 2019-05-06: 11:00:00 10 mg via ORAL
  Filled 2019-05-05: qty 2

## 2019-05-05 MED ORDER — NALBUPHINE HCL 10 MG/ML IJ SOLN: 5.0000 mg | Freq: Once | INTRAMUSCULAR | Status: DC | PRN

## 2019-05-05 MED ORDER — LIDOCAINE 5 % EX PTCH
1.0000 | MEDICATED_PATCH | CUTANEOUS | Status: DC
  Filled 2019-05-05: qty 1

## 2019-05-05 MED ORDER — PHENYLEPHRINE HCL (PRESSORS) 10 MG/ML IV SOLN
INTRAVENOUS | Status: DC | PRN
  Administered 2019-05-05: 100 ug via INTRAVENOUS

## 2019-05-05 MED ORDER — GENTAMICIN SULFATE 40 MG/ML IJ SOLN
1.5000 mg/kg | Freq: Once | INTRAVENOUS | Status: AC
  Administered 2019-05-05: 08:00:00 110 mg via INTRAVENOUS
  Filled 2019-05-05: qty 2.75

## 2019-05-05 MED ORDER — DIPHENHYDRAMINE HCL 25 MG PO CAPS: 25.0000 mg | ORAL_CAPSULE | ORAL | Status: DC | PRN

## 2019-05-05 MED ORDER — PROPOFOL 10 MG/ML IV BOLUS
INTRAVENOUS | Status: AC
  Filled 2019-05-05: qty 20

## 2019-05-05 MED ORDER — ACETAMINOPHEN 500 MG PO TABS: 1000.0000 mg | ORAL_TABLET | ORAL | Status: DC

## 2019-05-05 MED ORDER — NALBUPHINE HCL 10 MG/ML IJ SOLN: 5.0000 mg | INTRAMUSCULAR | Status: DC | PRN

## 2019-05-05 MED ORDER — FENTANYL CITRATE (PF) 100 MCG/2ML IJ SOLN
INTRAMUSCULAR | Status: DC | PRN
  Administered 2019-05-05: 15 ug via INTRATHECAL
  Administered 2019-05-05: 50 ug via INTRAVENOUS

## 2019-05-05 MED ORDER — PROPYLTHIOURACIL 50 MG PO TABS
100.0000 mg | ORAL_TABLET | Freq: Three times a day (TID) | ORAL | Status: DC
  Administered 2019-05-05: 07:00:00 100 mg via ORAL
  Filled 2019-05-05: qty 2

## 2019-05-05 MED ORDER — FENTANYL CITRATE (PF) 100 MCG/2ML IJ SOLN
INTRAMUSCULAR | Status: AC
  Filled 2019-05-05: qty 2

## 2019-05-05 MED ORDER — DEXAMETHASONE SODIUM PHOSPHATE 10 MG/ML IJ SOLN
INTRAMUSCULAR | Status: DC | PRN
  Administered 2019-05-05: 10 mg via INTRAVENOUS

## 2019-05-05 MED ORDER — OXYTOCIN 40 UNITS IN NORMAL SALINE INFUSION - SIMPLE MED
2.5000 [IU]/h | INTRAVENOUS | Status: AC
  Filled 2019-05-05: qty 1000

## 2019-05-05 MED ORDER — SOD CITRATE-CITRIC ACID 500-334 MG/5ML PO SOLN
ORAL | Status: AC
  Administered 2019-05-05: 07:00:00 15 mL
  Filled 2019-05-05: qty 15

## 2019-05-05 MED ORDER — DIPHENHYDRAMINE HCL 25 MG PO CAPS: 25.0000 mg | ORAL_CAPSULE | Freq: Four times a day (QID) | ORAL | Status: DC | PRN

## 2019-05-05 MED ORDER — BUPIVACAINE IN DEXTROSE 0.75-8.25 % IT SOLN
INTRATHECAL | Status: DC | PRN
  Administered 2019-05-05: 1.6 mL via INTRATHECAL

## 2019-05-05 MED ORDER — ACETAMINOPHEN 325 MG PO TABS
650.0000 mg | ORAL_TABLET | Freq: Four times a day (QID) | ORAL | Status: AC
  Administered 2019-05-05 – 2019-05-06 (×3): 650 mg via ORAL
  Filled 2019-05-05 (×4): qty 2

## 2019-05-05 MED ORDER — OXYTOCIN 40 UNITS IN NORMAL SALINE INFUSION - SIMPLE MED
INTRAVENOUS | Status: DC | PRN
  Administered 2019-05-05: 600 mL via INTRAVENOUS

## 2019-05-05 MED ORDER — MORPHINE SULFATE (PF) 0.5 MG/ML IJ SOLN
INTRAMUSCULAR | Status: AC
  Filled 2019-05-05: qty 10

## 2019-05-05 MED ORDER — OXYCODONE-ACETAMINOPHEN 5-325 MG PO TABS
1.0000 | ORAL_TABLET | ORAL | Status: DC | PRN
  Administered 2019-05-06 – 2019-05-07 (×2): 1 via ORAL
  Filled 2019-05-05 (×2): qty 1

## 2019-05-05 MED ORDER — LACTATED RINGERS IV SOLN: INTRAVENOUS | Status: DC

## 2019-05-05 MED ORDER — LACTATED RINGERS IV SOLN
INTRAVENOUS | Status: DC
  Administered 2019-05-05: 06:00:00 via INTRAVENOUS

## 2019-05-05 MED ORDER — PRENATAL MULTIVITAMIN CH
1.0000 | ORAL_TABLET | Freq: Every day | ORAL | Status: DC
  Administered 2019-05-06 – 2019-05-07 (×2): 1 via ORAL
  Filled 2019-05-05 (×2): qty 1

## 2019-05-05 MED ORDER — SOD CITRATE-CITRIC ACID 500-334 MG/5ML PO SOLN
30.0000 mL | ORAL | Status: AC
  Administered 2019-05-05: 07:00:00 30 mL via ORAL
  Filled 2019-05-05: qty 30

## 2019-05-05 MED ORDER — MENTHOL 3 MG MT LOZG
1.0000 | LOZENGE | OROMUCOSAL | Status: DC | PRN
  Filled 2019-05-05: qty 9

## 2019-05-05 MED ORDER — ONDANSETRON HCL 4 MG/2ML IJ SOLN: 4.0000 mg | Freq: Three times a day (TID) | INTRAMUSCULAR | Status: DC | PRN

## 2019-05-05 MED ORDER — IBUPROFEN 600 MG PO TABS
600.0000 mg | ORAL_TABLET | Freq: Four times a day (QID) | ORAL | Status: DC
  Administered 2019-05-05 – 2019-05-07 (×6): 600 mg via ORAL
  Filled 2019-05-05 (×8): qty 1

## 2019-05-05 MED ORDER — ZOLPIDEM TARTRATE 5 MG PO TABS: 5.0000 mg | ORAL_TABLET | Freq: Every evening | ORAL | Status: DC | PRN

## 2019-05-05 MED ORDER — ONDANSETRON HCL 4 MG/2ML IJ SOLN
INTRAMUSCULAR | Status: DC | PRN
  Administered 2019-05-05: 4 mg via INTRAVENOUS

## 2019-05-05 MED ORDER — SIMETHICONE 80 MG PO CHEW
80.0000 mg | CHEWABLE_TABLET | Freq: Four times a day (QID) | ORAL | Status: DC
  Administered 2019-05-05 – 2019-05-07 (×7): 80 mg via ORAL
  Filled 2019-05-05 (×7): qty 1

## 2019-05-05 MED ORDER — SODIUM CHLORIDE 0.9 % IV SOLN
INTRAVENOUS | Status: DC | PRN
  Administered 2019-05-05: 08:00:00 50 ug/min via INTRAVENOUS

## 2019-05-05 MED ORDER — OXYTOCIN 40 UNITS IN NORMAL SALINE INFUSION - SIMPLE MED
INTRAVENOUS | Status: AC
  Filled 2019-05-05: qty 1000

## 2019-05-05 SURGICAL SUPPLY — 30 items
ADH LQ OCL WTPRF AMP STRL LF (MISCELLANEOUS) ×1
ADHESIVE MASTISOL STRL (MISCELLANEOUS) ×3 IMPLANT
APL PRP STRL LF DISP 70% ISPRP (MISCELLANEOUS) ×2
BAG COUNTER SPONGE EZ (MISCELLANEOUS) ×2 IMPLANT
BAG SPNG 4X4 CLR HAZ (MISCELLANEOUS) ×1
CANISTER SUCT 3000ML PPV (MISCELLANEOUS) ×3 IMPLANT
CHLORAPREP W/TINT 26 (MISCELLANEOUS) ×6 IMPLANT
COUNTER SPONGE BAG EZ (MISCELLANEOUS) ×1
COVER WAND RF STERILE (DRAPES) ×3 IMPLANT
DRAPE C SECTION CLR SCREEN (DRAPES) ×2 IMPLANT
DRSG TELFA 3X8 NADH (GAUZE/BANDAGES/DRESSINGS) ×3 IMPLANT
GAUZE SPONGE 4X4 12PLY STRL (GAUZE/BANDAGES/DRESSINGS) ×3 IMPLANT
GLOVE BIOGEL PI ORTHO PRO 7.5 (GLOVE) ×2
GLOVE PI ORTHO PRO STRL 7.5 (GLOVE) ×1 IMPLANT
GOWN STRL REUS W/ TWL LRG LVL3 (GOWN DISPOSABLE) ×2 IMPLANT
GOWN STRL REUS W/TWL LRG LVL3 (GOWN DISPOSABLE) ×6
KIT TURNOVER KIT A (KITS) ×3 IMPLANT
NS IRRIG 1000ML POUR BTL (IV SOLUTION) ×3 IMPLANT
PACK C SECTION AR (MISCELLANEOUS) ×3 IMPLANT
PAD DRESSING TELFA 3X8 NADH (GAUZE/BANDAGES/DRESSINGS) ×1 IMPLANT
PAD OB MATERNITY 4.3X12.25 (PERSONAL CARE ITEMS) ×3 IMPLANT
PAD PREP 24X41 OB/GYN DISP (PERSONAL CARE ITEMS) ×3 IMPLANT
PENCIL SMOKE ULTRAEVAC 22 CON (MISCELLANEOUS) ×3 IMPLANT
RETRACTOR WND ALEXIS-O 25 LRG (MISCELLANEOUS) ×1 IMPLANT
RTRCTR WOUND ALEXIS O 25CM LRG (MISCELLANEOUS) ×3
SPONGE LAP 18X18 RF (DISPOSABLE) ×3 IMPLANT
SUT VIC AB 0 CTX 36 (SUTURE) ×6
SUT VIC AB 0 CTX36XBRD ANBCTRL (SUTURE) ×2 IMPLANT
SUT VIC AB 1 CT1 36 (SUTURE) ×6 IMPLANT
SUT VICRYL+ 3-0 36IN CT-1 (SUTURE) ×6 IMPLANT

## 2019-05-05 NOTE — Lactation Note (Signed)
This note was copied from a baby's chart. Lactation Consultation Note  Patient Name: Boy Derrick Eppers ACZYS'A Date: 05/05/2019 Reason for consult: Follow-up assessment   Maternal Data    Feeding Feeding Type: Breast Fed  LATCH Score Latch: Repeated attempts needed to sustain latch, nipple held in mouth throughout feeding, stimulation needed to elicit sucking reflex.  Audible Swallowing: A few with stimulation  Type of Nipple: Everted at rest and after stimulation  Comfort (Breast/Nipple): Soft / non-tender  Hold (Positioning): Assistance needed to correctly position infant at breast and maintain latch.  LATCH Score: 7  Interventions Interventions: Assisted with latch;Skin to skin;Adjust position  Lactation Tools Discussed/Used     Consult Status  LC assisted with latch and postioning. Infant was able to easily latch on left side. LC educated mother on how to achieve a deep latch and flange lips. Mother has a skin tag on the left nipple but it does not affect infant's ability to latch on. Mother educated on feeding cues, the importance of skin to skin and cluster feeding.    Arlyss Gandy 05/05/2019, 2:43 PM

## 2019-05-05 NOTE — Anesthesia Post-op Follow-up Note (Signed)
Anesthesia QCDR form completed.        

## 2019-05-05 NOTE — Op Note (Signed)
° ° °    OP NOTE  Date: 05/05/2019   9:23 AM Name Michelle Osborn MR# [id number]  Preoperative Diagnosis: 1. Intrauterine pregnancy at [redacted]w[redacted]d Active Problems:   Chronic hypertension with superimposed preeclampsia  2.  Previous CD pt desires repeat. 3.  Chronic HTN with superimposed pre-Eclampsia 4.  Gestational DM 5.  Hyperthyroidism 6.  H/O MI  With stent  Postoperative Diagnosis: 1. Intrauterine pregnancy at [redacted]w[redacted]d, delivered 2. Viable infant 3. Remainder same as pre-op  Procedure: 1. Repeat Low-Transverse Cesarean Section  Surgeon: Elonda Husky, MD  Assistant:  Valentino Saxon MD - No other capable assistant available for this surgery which requires an experienced, high level assistant.   Anesthesia: Spinal    EBL: 700  ml     Findings: 1) female infant, Apgar scores of 9    at 1 minute and 9    at 5 minutes and a birthweight of 96.3  ounces.    2) Normal uterus, tubes and ovaries.    Procedure:  The patient was prepped and draped in the supine position and placed under spinal anesthesia.  A transverse incision was made across the abdomen in a Pfannenstiel manner. If indicated the old scar was systematically removed with sharp dissection.  We carried the dissection down to the level of the fascia.  The fascia was incised in a curvilinear manner.  The fascia was then elevated from the rectus muscles with blunt and sharp dissection.  The rectus muscles were separated laterally exposing the peritoneum.  The peritoneum was carefully entered with care being taken to avoid bowel and bladder.  A self-retaining retractor was placed.  The visceral peritoneum was incised in a curvilinear fashion across the lower uterine segment creating a bladder flap. A transverse incision was made across the lower uterine segment and extended laterally and superiorly using the bandage scissors.  Artificial rupture membranes was performed and Clear fluid was noted.  The infant was delivered from the cephalic  position.  A nuchal cord was not present. After an appropriate time interval, the cord was doubly clamped and cut. Cord blood was obtained if required.  The infant was handed to the pediatric personnel  who then placed the infant under heat lamps where it was cleaned dried and suctioned as needed. The placenta was delivered. The hysterotomy incision was then identified on ring forceps.  The uterine cavity was cleaned with a moist lap sponge.  The hysterotomy incision was closed with a running interlocking suture of Vicryl.  Hemostasis was excellent.  Pitocin was run in the IV and the uterus was found to be firm. The posterior cul-de-sac and gutters were cleaned and inspected.  Hemostasis was noted.  The fascia was then closed with a running suture of #1 Vicryl.  Hemostasis of the subcutaneous tissues was obtained using the Bovie.  The subcutaneous tissues were closed with a running suture of 000 Vicryl.  A subcuticular suture was placed.  Steri-Strips were applied in the usual manner.  A pressure dressing was placed.  The patient went to the recovery room in stable condition.   Elonda Husky, M.D. 05/05/2019 9:23 AM

## 2019-05-05 NOTE — Transfer of Care (Signed)
Immediate Anesthesia Transfer of Care Note  Patient: Michelle Osborn  Procedure(s) Performed: REPEAT CESAREAN SECTION (N/A )  Patient Location: PACU  Anesthesia Type:Spinal  Level of Consciousness: awake, alert  and oriented  Airway & Oxygen Therapy: Patient Spontanous Breathing  Post-op Assessment: Report given to RN and Post -op Vital signs reviewed and stable  Post vital signs: Reviewed and stable  Last Vitals:  Vitals Value Taken Time  BP    Temp    Pulse    Resp    SpO2      Last Pain:  Vitals:   05/05/19 0627  TempSrc: Oral         Complications: No apparent anesthesia complications

## 2019-05-05 NOTE — Anesthesia Procedure Notes (Signed)
Performed by: Aalijah Mims, CRNA Pre-anesthesia Checklist: Patient identified, Emergency Drugs available, Suction available, Patient being monitored and Timeout performed Oxygen Delivery Method: Nasal cannula       

## 2019-05-05 NOTE — Interval H&P Note (Signed)
History and Physical Interval Note:  05/05/2019 7:56 AM  Michelle Osborn  has presented today for surgery, with the diagnosis of PRIOR C SECTION, CHRONIC HYPERTENSION, PRE EXISTING DIABETES.  The various methods of treatment have been discussed with the patient and family. After consideration of risks, benefits and other options for treatment, the patient has consented to  Procedure(s): REPEAT CESAREAN SECTION (N/A) as a surgical intervention.  The patient's history has been reviewed, patient examined, no change in status, stable for surgery.  I have reviewed the patient's chart and labs.  Questions were answered to the patient's satisfaction.     Brennan Bailey

## 2019-05-05 NOTE — Anesthesia Preprocedure Evaluation (Signed)
Anesthesia Evaluation  Patient identified by MRN, date of birth, ID band Patient awake    Reviewed: Allergy & Precautions, NPO status , Patient's Chart, lab work & pertinent test results  History of Anesthesia Complications Negative for: history of anesthetic complications  Airway Mallampati: II  TM Distance: >3 FB Neck ROM: Full    Dental  (+) Poor Dentition, Missing   Pulmonary asthma (mild intermittent) , neg sleep apnea, Current Smoker,    breath sounds clear to auscultation- rhonchi (-) wheezing      Cardiovascular hypertension, Pt. on medications (-) angina+ CAD, + Past MI and + Cardiac Stents (DES to LAD and D1 03/2016 )  (-) CABG  Rhythm:Regular Rate:Normal - Systolic murmurs and - Diastolic murmurs    Neuro/Psych neg Seizures PSYCHIATRIC DISORDERS Bipolar Disorder negative neurological ROS     GI/Hepatic Neg liver ROS, GERD  ,  Endo/Other  diabetes  Renal/GU negative Renal ROS     Musculoskeletal negative musculoskeletal ROS (+)   Abdominal (+) + obese,   Peds  Hematology negative hematology ROS (+)   Anesthesia Other Findings Past Medical History: No date: Asthma No date: Diabetes mellitus No date: Hypertension No date: Hyperthyroidism No date: MI (myocardial infarction) (HCC) No date: Neuropathy No date: Tachycardia No date: Thyroid disease   Reproductive/Obstetrics (+) Pregnancy                             Lab Results  Component Value Date   WBC 10.1 05/05/2019   HGB 11.7 (L) 05/05/2019   HCT 34.3 (L) 05/05/2019   MCV 82.9 05/05/2019   PLT 210 05/05/2019    Anesthesia Physical Anesthesia Plan  ASA: III  Anesthesia Plan: Spinal   Post-op Pain Management:    Induction:   PONV Risk Score and Plan: 1 and Ondansetron  Airway Management Planned: Natural Airway  Additional Equipment:   Intra-op Plan:   Post-operative Plan:   Informed Consent: I have  reviewed the patients History and Physical, chart, labs and discussed the procedure including the risks, benefits and alternatives for the proposed anesthesia with the patient or authorized representative who has indicated his/her understanding and acceptance.     Dental advisory given  Plan Discussed with: CRNA and Anesthesiologist  Anesthesia Plan Comments:         Anesthesia Quick Evaluation

## 2019-05-05 NOTE — Progress Notes (Signed)
Patient tearful when RN entered the room. RN asked what was wrong pt stated nothing. RN asked if there was anything she could do,and the patient stated no. RN asked if the patient was safe and she said yes.   patient completed edinburgh depression scale and scored a 22, social work consult already in place due to infant being MJ positive and patient being homeless. RN modified it with updated edinburgh scale score.   RN talked to the patient about collecting a UDS on her per protocol and she said she understands, she knew the infant would be positive for MJ and stated, " if they had a life like this they would smoke too"

## 2019-05-05 NOTE — H&P (Signed)
History and Physical   HPI  Michelle Osborn is a 33 y.o. G2P1001 at 74w0dEstimated Date of Delivery: 05/26/19 who is being admitted for C-section for chronic HTN/Gestational DM and Pre-Eclmapsia.  Pt desires repeat.   OB History  OB History  Gravida Para Term Preterm AB Living  _0 0 0 1  SAB TAB Ectopic Multiple Live Births  0 0 0 0 1    # Outcome Date GA Lbr Len/2nd Weight Sex Delivery Anes PTL Lv  2 Current           1 Term 11/19/17    F CS-LTranv   LIV     Complications: Fetal Intolerance    PROBLEM LIST  Pregnancy complications or risks: Patient Active Problem List   Diagnosis Date Noted  . Chronic hypertension with superimposed preeclampsia 05/01/2019  . Supervision of high risk pregnancy, antepartum 01/16/2019  . Diabetes mellitus complicating pregnancy 053/66/4403 . 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 Tachycardia 01/09/2019  . Mixed hyperlipidemia 01/09/2019  . Hyperthyroidism affecting pregnancy in second trimester 01/07/2019  . Lead exposure 12/06/2018  . Smoker 12/06/2018  . Bipolar 2 disorder ([place] 12/06/2018  . History of anxiety 12/06/2018  . Mild intermittent asthma without complication 147/42/5956 . Type 2 diabetes mellitus without complication, without long-term current use of insulin ([place] 12/06/2018  . High-risk pregnancy in second trimester 12/06/2018  . History of heart attack 12/06/2018  . Graves disease 10/30/2018  . History of cesarean section 10/30/2018  . Nausea/vomiting in pregnancy 10/30/2018  . History of marijuana use 10/30/2018  . HYPERCHOLESTEROLEMIA 12/08/2009  . HIDRADENITIS SUPPURATIVA 12/08/2009  . VITAMIN D DEFICIENCY 11/18/2009  . GERD 11/18/2009  . MENORRHAGIA 11/18/2009  . Diabetes mellitus type 2 with complications (HG[place] 138/75/6433 . HYPERTENSION 11/17/2009  . ASTHMA 11/17/2009     Prenatal labs and studies: ABO, Rh: --/--/O POS [facility]formed [facility] [address] [place] [address] [phone] Antibody: NEG (05/05 0602) Rubella: 11.00 (10/31 1607) RPR: Non Reactive (03/04 1212)  HBsAg: Negative (10/31 1607)  HIV: NON REACTIVE (05/05 0602)  GBS:    Past Medical History:  Diagnosis Date  . Asthma   . Diabetes mellitus   . Hypertension   . Hyperthyroidism   . MI (myocardial infarction) (HC[place]   . Neuropathy   . Tachycardia   . Thyroid disease      Past Surgical History:  Procedure Laterality Date  . ADENOIDECTOMY    . APPENDECTOMY    . [place]  [place]  . CESAREAN SECTION    . CORONARY ANGIOPLASTY WITH STENT PLACEMENT  03/2016  . DILATION AND CURETTAGE OF UTERUS    . OTHER SURGICAL HISTORY     sweat gland excision  . TONSILLECTOMY       Medications    Current Discharge Medication List    CONTINUE these medications which have NOT CHANGED   Details  albuterol (PROVENTIL HFA;VENTOLIN HFA) 108 (90 Base) MCG/ACT inhaler Inhale 2 puffs into the lungs every 4 (four) hours as needed for wheezing or shortness of breath. Qty: 1 Inhaler, Refills: 2    aspirin 81 MG chewable tablet Chew 81 mg by mouth daily.    labetalol (NORMODYNE) 100 MG tablet Take 1 tablet (100 mg total) by mouth 2 (two) times daily. Qty: 60 tablet, Refills: 1    Prenatal Vit-Fe Fumarate-FA (PRENATAL VITAMIN) 27-0.8  MG TABS Take 1 tablet by mouth daily.     propylthiouracil (PTU) 50 MG tablet Take 2 tablets (100 mg total) by mouth 3 (three) times daily. Qty: 90 tablet, Refills: 6    ACCU-CHEK FASTCLIX LANCETS MISC 1 Units by Percutaneous route 4 (four) times daily. Qty: 100 each, Refills: 12   Associated Diagnoses: Diabetes mellitus affecting pregnancy in second trimester    albuterol (PROVENTIL) (2.5 MG/3ML) 0.083% nebulizer solution Take 3 mLs (2.5 mg total) by nebulization every 6 (six) hours as needed for wheezing or shortness of breath. Qty: 75 mL, Refills: 12    Blood Glucose  Monitoring Suppl (ACCU-CHEK GUIDE) w/Device KIT 1 Device by Does not apply route 4 (four) times daily. Qty: 1 kit, Refills: 0   Associated Diagnoses: Diabetes mellitus affecting pregnancy in second trimester    EPINEPHrine 0.3 mg/0.3 mL IJ SOAJ injection Inject 0.3 mg into the muscle as needed for anaphylaxis.     glucose blood test strip Use as instructed Qty: 100 each, Refills: 12   Associated Diagnoses: Diabetes mellitus affecting pregnancy in second trimester    Respiratory Therapy Supplies (NEBULIZER) DEVI 1 Device by Does not apply route every 4 (four) hours as needed. Qty: 1 each, Refills: 0    terconazole (TERAZOL 7) 0.4 % vaginal cream Place 1 applicator vaginally at bedtime. Use for 7 days. Qty: 45 g, Refills: 0         Allergies  Darvocet [propoxyphene n-acetaminophen]; Peanut-containing drug products; Penicillins; Cephalexin; Tramadol; and Toradol [ketorolac tromethamine]  Review of Systems  Pertinent items are noted in HPI.  Physical Exam  BP 136/81   Pulse 78   Temp 97.6 F (36.4 C) (Oral)   Ht _0  (1.702 m)   Wt 92.1 kg   LMP 08/15/2018 (Exact Date)   BMI 31.79 kg/m   Lungs:  CTA B Cardio: RRR without M/R/G Abd: Soft, gravid, NT Presentation: cephalic EXT: No C/C/ 1+ Edema DTRs: 2+ B CERVIX:    See Prenatal records for more detailed PE.     FHR:  Variability: Good {> 6 bpm)  Toco: Uterine Contractions: None   Test Results  Results for orders placed or performed during the hospital encounter of 05/05/19 (from the past 24 hour(s))  CBC     Status: Abnormal   Collection Time: 05/05/19  6:02 AM  Result Value Ref Range   WBC 10.1 4.0 - 10.5 K/uL   RBC 4.14 3.87 - 5.11 MIL/uL   Hemoglobin 11.7 (L) 12.0 - 15.0 g/dL   HCT 34.3 (L) 36.0 - 46.0 %   MCV 82.9 80.0 - 100.0 fL   MCH 28.3 26.0 - 34.0 pg   MCHC 34.1 30.0 - 36.0 g/dL   RDW 14.6 11.5 - 15.5 %   Platelets 210 150 - 400 K/uL   nRBC 0.0 0.0 - 0.2 %  Comprehensive metabolic panel      Status: Abnormal   Collection Time: 05/05/19  6:02 AM  Result Value Ref Range   Sodium 137 135 - 145 mmol/L   Potassium 4.3 3.5 - 5.1 mmol/L   Chloride 111 98 - 111 mmol/L   CO2 20 (L) 22 - 32 mmol/L   Glucose, Bld 84 70 - 99 mg/dL   BUN 11 6 - 20 mg/dL   Creatinine, Ser 0.57 0.44 - 1.00 mg/dL   Calcium 8.6 (L) 8.9 - 10.3 mg/dL   Total Protein 6.5 6.5 - 8.1 g/dL   Albumin 3.1 (L) 3.5 - 5.0 g/dL  AST 12 (L) 15 - 41 U/L   ALT 8 0 - 44 U/L   Alkaline Phosphatase 208 (H) 38 - 126 U/L   Total Bilirubin 0.1 (L) 0.3 - 1.2 mg/dL   GFR calc non Af Amer >60 >60 mL/min   GFR calc Af Amer >60 >60 mL/min   Anion gap 6 5 - 15  Rapid HIV screen (HIV 1/2 Ab+Ag)     Status: None   Collection Time: 05/05/19  6:02 AM  Result Value Ref Range   HIV-1 P24 Antigen - HIV24 NON REACTIVE NON REACTIVE   HIV 1/2 Antibodies NON REACTIVE NON REACTIVE   Interpretation (HIV Ag Ab)      A non reactive test result means that HIV 1 or HIV 2 antibodies and HIV 1 p24 antigen were not detected in the specimen.  Type and screen     Status: None   Collection Time: 05/05/19  6:02 AM  Result Value Ref Range   ABO/RH(D) O POS    Antibody Screen NEG    Sample Expiration      05/08/2019 Performed at [facility], [address] [place], [place] [address]   ABO/Rh     Status: None   Collection Time: 05/05/19  6:15 AM  Result Value Ref Range   ABO/RH(D)      O POS Performed at [facility], [address] [place], [place] [address]      Assessment   G2P1001 at 6w0dEstimated Date of Delivery: 05/26/19  The fetus is reassuring.   Patient Active Problem List   Diagnosis Date Noted  . Chronic hypertension with superimposed preeclampsia 05/01/2019  . Supervision of high risk pregnancy, antepartum 01/16/2019  . Diabetes mellitus complicating pregnancy 005/69/7948 . 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 Tachycardia 01/09/2019  . Mixed  hyperlipidemia 01/09/2019  . Hyperthyroidism affecting pregnancy in second trimester 01/07/2019  . Lead exposure 12/06/2018  . Smoker 12/06/2018  . Bipolar 2 disorder ([place] 12/06/2018  . History of anxiety 12/06/2018  . Mild intermittent asthma without complication 101/65/5374 . Type 2 diabetes mellitus without complication, without long-term current use of insulin ([place] 12/06/2018  . High-risk pregnancy in second trimester 12/06/2018  . History of heart attack 12/06/2018  . Graves disease 10/30/2018  . History of cesarean section 10/30/2018  . Nausea/vomiting in pregnancy 10/30/2018  . History of marijuana use 10/30/2018  . HYPERCHOLESTEROLEMIA 12/08/2009  . HIDRADENITIS SUPPURATIVA 12/08/2009  . VITAMIN D DEFICIENCY 11/18/2009  . GERD 11/18/2009  . MENORRHAGIA 11/18/2009  . Diabetes mellitus type 2 with complications (H[place] 182/70/7867 . HYPERTENSION 11/17/2009  . ASTHMA 11/17/2009    Plan  1. Admit to L&D :    2. EFM: -- Category 1 3. Plan repeat CD 4. Admission labs    DFinis Bud M.D. 05/05/2019 7:54 AM

## 2019-05-05 NOTE — Anesthesia Procedure Notes (Signed)
Spinal  Patient location during procedure: OR Start time: 05/05/2019 7:50 AM End time: 05/05/2019 8:00 AM Staffing Anesthesiologist: Emmie Niemann, MD Resident/CRNA: Demetrius Charity, CRNA Performed: resident/CRNA and anesthesiologist  Preanesthetic Checklist Completed: patient identified, site marked, surgical consent, pre-op evaluation, timeout performed, IV checked, risks and benefits discussed and monitors and equipment checked Spinal Block Patient position: sitting Prep: ChloraPrep Patient monitoring: heart rate, continuous pulse ox, blood pressure and cardiac monitor Approach: midline Location: L4-5 Injection technique: single-shot Needle Needle type: Introducer and Pencil-Tip  Needle gauge: 24 G Needle length: 9 cm Additional Notes Negative paresthesia. Negative blood return. Positive free-flowing CSF. Expiration date of kit checked and confirmed. Patient tolerated procedure well, without complications.

## 2019-05-06 ENCOUNTER — Other Ambulatory Visit: Payer: Medicaid Other

## 2019-05-06 ENCOUNTER — Encounter: Payer: Medicaid Other | Admitting: Obstetrics and Gynecology

## 2019-05-06 LAB — GLUCOSE, CAPILLARY
Glucose-Capillary: 73 mg/dL (ref 70–99)
Glucose-Capillary: 110 mg/dL — ABNORMAL HIGH (ref 70–99)
Glucose-Capillary: 90 mg/dL (ref 70–99)
Glucose-Capillary: 116 mg/dL — ABNORMAL HIGH (ref 70–99)

## 2019-05-06 LAB — RPR: RPR Ser Ql: NONREACTIVE

## 2019-05-06 NOTE — Anesthesia Postprocedure Evaluation (Signed)
Anesthesia Post Note  Patient: Michelle Osborn  Procedure(s) Performed: REPEAT CESAREAN SECTION (N/A )  Patient location during evaluation: Mother Baby Anesthesia Type: Spinal Level of consciousness: oriented and awake and alert Pain management: pain level controlled Vital Signs Assessment: post-procedure vital signs reviewed and stable Respiratory status: spontaneous breathing and respiratory function stable Cardiovascular status: blood pressure returned to baseline and stable Postop Assessment: no headache, no backache, no apparent nausea or vomiting and able to ambulate Anesthetic complications: no     Last Vitals:  Vitals:   05/06/19 0400 05/06/19 0500  BP:    Pulse: 82 84  Resp:    Temp:    SpO2: 99% 99%    Last Pain:  Vitals:   05/06/19 0321  TempSrc: Oral  PainSc:                  Karoline Caldwell

## 2019-05-06 NOTE — Anesthesia Post-op Follow-up Note (Signed)
  Anesthesia Pain Follow-up Note  Patient: Michelle Osborn  Day #: 1  Date of Follow-up: 05/06/2019 Time: 7:24 AM  Last Vitals:  Vitals:   05/06/19 0400 05/06/19 0500  BP:    Pulse: 82 84  Resp:    Temp:    SpO2: 99% 99%    Level of Consciousness: alert  Pain: none   Side Effects:None  Catheter Site Exam:clean, dry, no drainage     Plan: D/C from anesthesia care at surgeon's request  Karoline Caldwell

## 2019-05-06 NOTE — Progress Notes (Signed)
Patient ID: Tabby Sirles, female   DOB: Jan 10, 1986, 33 y.o.   MRN: [id number]    Progress Note - Cesarean Delivery  Yashica Resko is a 33 y.o. G2P2002 now PP day 1 s/p C-Section, Low Transverse .   Subjective:  Patient reports no problems with eating, bowel movements, voiding, or their wound  Pain controlled.  She is breast-feeding  Objective:  Vital signs in last 24 hours: Temp:  [97.4 F (36.3 C)-98.4 F (36.9 C)] 98 F (36.7 C) (05/06 1114) Pulse Rate:  [57-84] 75 (05/06 1114) Resp:  [16-20] 20 (05/06 1114) BP: (110-132)/(71-85) 121/72 (05/06 1114) SpO2:  [98 %-100 %] 100 % (05/06 0740)  Physical Exam:  General: alert, cooperative and no distress Lochia: appropriate Uterine Fundus: firm Incision: Dressing intact-dry     Data Review Recent Labs    05/05/19 0602  HGB 11.7*  HCT 34.3*    Assessment:  Active Problems:   Chronic hypertension with superimposed preeclampsia   Status post Cesarean section. Doing well postoperatively.   Sugars good, hypertension good  Plan:       Continue current care.  Probable discharge tomorrow  Elonda Husky, M.D. 05/06/2019 11:49 AM

## 2019-05-06 NOTE — Lactation Note (Signed)
This note was copied from a baby's chart. Lactation Consultation Note  Patient Name: Michelle Osborn Date: 05/06/2019 Reason for consult: Follow-up assessment   Maternal Data    Feeding Feeding Type: Bottle Fed - Formula Nipple Type: Slow - flow  LATCH Score                   Interventions    Lactation Tools Discussed/Used Tools: Pump(initiated per LC as part of supplementation plan) Pump Review: Setup, frequency, and cleaning Initiated by:: Arlyss Gandy, RN IBCLC Date initiated:: 05/06/19   Consult Status  LC was informed by nurse of Pediatrician's plans to supplement infant due to blood sugar. Mother was informed of plan, initiated pumping and encouraged to pump every 2-3 hours for 15 minutes. Mother did not produce much breast milk with the first pumping session so infant was supplemented with formula. Plan is for mother to continue breastfeeding, pump afterwards and supplement infant with her breastmilk or formula.    Arlyss Gandy 05/06/2019, 12:28 PM

## 2019-05-07 LAB — GLUCOSE, CAPILLARY: Glucose-Capillary: 69 mg/dL — ABNORMAL LOW (ref 70–99)

## 2019-05-07 MED ORDER — OXYCODONE-ACETAMINOPHEN 5-325 MG PO TABS: 1.0000 | ORAL_TABLET | ORAL | 0 refills | Status: DC | PRN

## 2019-05-07 MED ORDER — SERTRALINE HCL 50 MG PO TABS: 50.0000 mg | ORAL_TABLET | Freq: Every day | ORAL | 1 refills | Status: DC

## 2019-05-07 NOTE — Lactation Note (Signed)
This note was copied from a baby's chart. Lactation Consultation Note  Patient Name: Michelle Osborn Date: 05/07/2019 Reason for consult: Follow-up assessment   Maternal Data    Feeding Feeding Type: Formula Nipple Type: Slow - flow  LATCH Score                   Interventions    Lactation Tools Discussed/Used   Mother was shown how to hand express and how to use the hand pump.  Consult Status      Trudee Grip 05/07/2019, 11:57 AM

## 2019-05-07 NOTE — Discharge Summary (Signed)
Physician Obstetric Discharge Summary  Patient ID: Michelle Osborn MRN: [id number] DOB/AGE: [birth year] 33 y.o.   Date of Admission: 05/05/2019  Date of Discharge: 05/07/2019  Admitting Diagnosis: Scheduled cesarean section at [redacted]w[redacted]d Mode of Delivery: repeat cesarean section       low uterine, transverse     Discharge Diagnosis: Gestational DM, Mild pre-Eclampsia, Hyperthyroidism, Previous CD desires repeat, Post-partum depression with h/o depression (High [place] score)   Intrapartum Procedures:    Post partum procedures:   Complications: none                        Discharge Day SOAP Note:  Subjective:  The patient has no complaints.  She is ambulating well. She is taking PO well. Pain is well controlled with current medications. Patient is urinating without difficulty.   She is passing flatus.  [place] has had housing issues [place]  This is increased her stress level and she has scored high on her depression score.  She does have a history of depressive disorder.  Objective  Vital signs in last 24 hours: BP 109/72 (BP Location: Left Arm)   Pulse 61   Temp 97.8 F (36.6 C) (Oral)   Resp 20   Ht 5' 7"  (1.702 m)   Wt 92.1 kg   LMP 08/15/2018 (Exact Date)   SpO2 99%   Breastfeeding Unknown   BMI 31.79 kg/m   Physical Exam: Gen: NAD Abdomen:  clean, dry, no drainage, healing Fundus Fundal Tone: Firm  Lochia Amount: Small     Data Review Labs: CBC Latest Ref Rng & Units 05/05/2019 04/29/2019 03/10/2019  WBC 4.0 - 10.5 K/uL 10.1 9.1 10.6(H)  Hemoglobin 12.0 - 15.0 g/dL 11.7(L) 12.3 11.2(L)  Hematocrit 36.0 - 46.0 % 34.3(L) 37.6 34.0(L)  Platelets 150 - 400 K/uL 210 222 244   O POS [facility]formed at [facility] [address], [place] [address]  Assessment:  Active Problems:   Chronic hypertension with superimposed preeclampsia   Doing well.  Normal progress as expected. Hypertension currently  resolved, but patient was previously on antihypertensives when not pregnant because of her history. Gestational diabetes- patient has had normal sugars during her hospital stay. Postpartum depression- patient scored high on [place] scale and has a history of depressive disorder.  She interacts appropriately and is not at risk for harm to herself or her baby.    Plan:  Discharge to home  Modified rest as directed - may slowly resume normal activities with restrictions as discussed.  Medications as written.  Resume medications as prescribed.  See below for additional.  Patient to continue fingerstick glucose checks and bring results to the office in 1 week  Will start Zoloft for possible worsening postpartum depression.       Discharge Instructions: Per After Visit Summary. Activity: Advance as tolerated. Pelvic rest for 6 weeks.  Also refer to After Visit Summary.  Wound care discussed. Diet: Regular Medications: Allergies as of 05/07/2019      Reactions   Darvocet [propoxyphene N-acetaminophen] Anaphylaxis   Also makes her stomach hurt   Peanut-containing Drug Products Shortness Of Breath, Swelling   Penicillins Shortness Of Breath, Swelling   Did it involve swelling of the face/tongue/throat, SOB, or low BP? Yes Did it involve sudden or severe rash/hives, skin peeling, or any reaction on the inside of your mouth or nose? No Did you need to  seek medical attention at a hospital or doctor's office? Yes When did it last happen?2007 If all above answers are "NO", may proceed with cephalosporin use.   Cephalexin Hives, Itching   Tramadol Nausea And Vomiting   Toradol [ketorolac Tromethamine] Rash   Red rash with bumps      Medication List    STOP taking these medications   aspirin 81 MG chewable tablet   terconazole 0.4 % vaginal cream Commonly known as:  Terazol 7     TAKE these medications   Accu-Chek FastClix Lancets Misc 1 Units by Percutaneous route 4 (four) times  daily.   Accu-Chek Guide w/Device Kit 1 Device by Does not apply route 4 (four) times daily.   albuterol 108 (90 Base) MCG/ACT inhaler Commonly known as:  VENTOLIN HFA Inhale 2 puffs into the lungs every 4 (four) hours as needed for wheezing or shortness of breath. What changed:  Another medication with the same name was removed. Continue taking this medication, and follow the directions you see here.   EPINEPHrine 0.3 mg/0.3 mL Soaj injection Commonly known as:  EPI-PEN Inject 0.3 mg into the muscle as needed for anaphylaxis.   glucose blood test strip Use as instructed   labetalol 100 MG tablet Commonly known as:  NORMODYNE Take 1 tablet (100 mg total) by mouth 2 (two) times daily.   Nebulizer Devi 1 Device by Does not apply route every 4 (four) hours as needed.   oxyCODONE-acetaminophen 5-325 MG tablet Commonly known as:  PERCOCET/ROXICET Take 1 tablet by mouth every 4 (four) hours as needed for moderate pain.   Prenatal Vitamin 27-0.8 MG Tabs Take 1 tablet by mouth daily.   propylthiouracil 50 MG tablet Commonly known as:  PTU Take 2 tablets (100 mg total) by mouth 3 (three) times daily.   sertraline 50 MG tablet Commonly known as:  Zoloft Take 1 tablet (50 mg total) by mouth daily.      Outpatient follow up:  Follow-up Information    Harlin Heys, MD Follow up in 1 week(s).   Specialties:  Obstetrics and Gynecology, Radiology Contact information: [address] [place] [address] [phone]          Postpartum contraception: Will discuss at first post-partum visit.  Discharged Condition: good  Discharged to: home  Newborn Data: Disposition:home with mother  Apgars: APGAR (1 MIN): 9   APGAR (5 MINS): 9   APGAR (10 MINS):    Baby Feeding: Breast  Finis Bud, M.D. 05/07/2019 10:05 AM
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
## 2019-05-07 NOTE — Progress Notes (Signed)
Pt discharged with infant.  Discharge instructions, prescriptions and follow up appointment given to and reviewed with pt. Pt verbalized understanding. Escorted out by auxillary. 

## 2019-05-12 ENCOUNTER — Other Ambulatory Visit: Payer: Self-pay

## 2019-05-13 ENCOUNTER — Telehealth: Payer: Self-pay

## 2019-05-13 NOTE — Telephone Encounter (Signed)
Pt prescreened no sx no face mask, aware of no visitor policy.   Coronavirus (COVID-19) Are you at risk?  Are you at risk for the Coronavirus (COVID-19)?  To be considered HIGH RISK for Coronavirus (COVID-19), you have to meet the following criteria:  . Traveled to [place], [place], [place], [place] or [place]; or in the [place] to [place], [place], [place], or [place]; and have fever, cough, and shortness of breath within the last 2 weeks of travel OR . Been in close contact with a person diagnosed with COVID-19 within the last 2 weeks and have fever, cough, and shortness of breath . IF YOU DO NOT MEET THESE CRITERIA, YOU ARE CONSIDERED LOW RISK FOR COVID-19.  What to do if you are HIGH RISK for COVID-19?  Marland Kitchen If you are having a medical emergency, call 911. . Seek medical care right away. Before you go to a doctor's office, urgent care or emergency department, call ahead and tell them about your recent travel, contact with someone diagnosed with COVID-19, and your symptoms. You should receive instructions from your physician's office regarding next steps of care.  . When you arrive at healthcare provider, tell the healthcare staff immediately you have returned from visiting [place], [place], [place], [place] or [place]; or traveled in the [place] to [place], [place], [place], or [place]; in the last two weeks or you have been in close contact with a person diagnosed with COVID-19 in the last 2 weeks.   . Tell the health care staff about your symptoms: fever, cough and shortness of breath. . After you have been seen by a medical provider, you will be either: o Tested for (COVID-19) and discharged home on quarantine except to seek medical care if symptoms worsen, and asked to  - Stay home and avoid contact with others until you get your results (4-5 days)  - Avoid travel on public transportation if possible (such as bus, train, or airplane) or o Sent to the Emergency  Department by EMS for evaluation, COVID-19 testing, and possible admission depending on your condition and test results.  What to do if you are LOW RISK for COVID-19?  Reduce your risk of any infection by using the same precautions used for avoiding the common cold or flu:  Marland Kitchen Wash your hands often with soap and warm water for at least 20 seconds.  If soap and water are not readily available, use an alcohol-based hand sanitizer with at least 60% alcohol.  . If coughing or sneezing, cover your mouth and nose by coughing or sneezing into the elbow areas of your shirt or coat, into a tissue or into your sleeve (not your hands). . Avoid shaking hands with others and consider head nods or verbal greetings only. . Avoid touching your eyes, nose, or mouth with unwashed hands.  . Avoid close contact with people who are sick. . Avoid places or events with large numbers of people in one location, like concerts or sporting events. . Carefully consider travel plans you have or are making. . If you are planning any travel outside or inside the [place], visit the CDC's Travelers' Health webpage for the latest health notices. . If you have some symptoms but not all symptoms, continue to monitor at home and seek medical attention if your symptoms worsen. . If you are having a medical emergency, call 911.   ADDITIONAL HEALTHCARE OPTIONS FOR PATIENTS  [facility] / e-Visit: [email or web address]  [facility]: [phone]  [facility]: [phone]                   [facility]: [phone]  

## 2019-05-14 ENCOUNTER — Other Ambulatory Visit: Payer: Self-pay

## 2019-05-14 ENCOUNTER — Ambulatory Visit (INDEPENDENT_AMBULATORY_CARE_PROVIDER_SITE_OTHER): Payer: Medicaid Other | Admitting: Obstetrics and Gynecology

## 2019-05-14 ENCOUNTER — Encounter: Payer: Self-pay | Admitting: Obstetrics and Gynecology

## 2019-05-14 VITALS — BP 147/84 | HR 109 | Ht 67.0 in | Wt 187.6 lb

## 2019-05-14 DIAGNOSIS — Z9889 Other specified postprocedural states: Secondary | ICD-10-CM

## 2019-05-14 NOTE — Progress Notes (Signed)
HPI:      Michelle Osborn is a 33 y.o. [id number] who LMP was No LMP recorded.  Subjective:   She presents today approximately a week postop from cesarean delivery.  She is doing well.  She reports that her baby is doing well.  She is breast-feeding full-time.  She reports that her sugars are generally less than 110 and her last fasting was 98.  She states that she is having no problems at this time with postpartum depression. Her pain is controlled she is eating voiding and having bowel movements without issue.    Hx: The following portions of the patient's history were reviewed and updated as appropriate:             She  has a past medical history of Asthma, Diabetes mellitus, Hypertension, Hyperthyroidism, MI (myocardial infarction) ([place]), Neuropathy, Tachycardia, and Thyroid disease. She does not have any pertinent problems on file. She  has a past surgical history that includes Appendectomy; Tonsillectomy; Adenoidectomy; Other surgical history; Dilation and curettage of uterus; Cardiac catheterization (03/2016); Coronary angioplasty with stent (03/2016); Cesarean section; and Cesarean section (N/A, 05/05/2019). Her family history includes Asthma in her mother; Cancer in her maternal aunt and maternal grandmother; Diabetes in her mother; Heart disease in her father and mother; Heart failure in her mother; Hyperlipidemia in her mother; Hypertension in her father and mother; Kidney disease in her father. She  reports that she has been smoking. She has a 5.00 pack-year smoking history. She has never used smokeless tobacco. She reports current drug use. Drug: Marijuana. She reports that she does not drink alcohol. She has a current medication list which includes the following prescription(s): accu-chek fastclix lancets, albuterol, accu-chek guide, epinephrine, glucose blood, labetalol, oxycodone-acetaminophen, prenatal vitamin, propylthiouracil, nebulizer, and sertraline. She is allergic to darvocet  [propoxyphene n-acetaminophen]; peanut-containing drug products; penicillins; cephalexin; tramadol; and toradol [ketorolac tromethamine].       Review of Systems:  Review of Systems  Constitutional: Denied constitutional symptoms, night sweats, recent illness, fatigue, fever, insomnia and weight loss.  Eyes: Denied eye symptoms, eye pain, photophobia, vision change and visual disturbance.  Ears/Nose/Throat/Neck: Denied ear, nose, throat or neck symptoms, hearing loss, nasal discharge, sinus congestion and sore throat.  Cardiovascular: Denied cardiovascular symptoms, arrhythmia, chest pain/pressure, edema, exercise intolerance, orthopnea and palpitations.  Respiratory: Denied pulmonary symptoms, asthma, pleuritic pain, productive sputum, cough, dyspnea and wheezing.  Gastrointestinal: Denied, gastro-esophageal reflux, melena, nausea and vomiting.  Genitourinary: Denied genitourinary symptoms including symptomatic vaginal discharge, pelvic relaxation issues, and urinary complaints.  Musculoskeletal: Denied musculoskeletal symptoms, stiffness, swelling, muscle weakness and myalgia.  Dermatologic: Denied dermatology symptoms, rash and scar.  Neurologic: Denied neurology symptoms, dizziness, headache, neck pain and syncope.  Psychiatric: Denied psychiatric symptoms, anxiety and depression.  Endocrine: Denied endocrine symptoms including hot flashes and night sweats.   Meds:   Current Outpatient Medications on File Prior to Visit  Medication Sig Dispense Refill  . ACCU-CHEK FASTCLIX LANCETS MISC 1 Units by Percutaneous route 4 (four) times daily. 100 each 12  . albuterol (PROVENTIL HFA;VENTOLIN HFA) 108 (90 Base) MCG/ACT inhaler Inhale 2 puffs into the lungs every 4 (four) hours as needed for wheezing or shortness of breath. 1 Inhaler 2  . Blood Glucose Monitoring Suppl (ACCU-CHEK GUIDE) w/Device KIT 1 Device by Does not apply route 4 (four) times daily. 1 kit 0  . EPINEPHrine 0.3 mg/0.3 mL IJ  SOAJ injection Inject 0.3 mg into the muscle as needed for anaphylaxis.     Marland Kitchen  glucose blood test strip Use as instructed 100 each 12  . labetalol (NORMODYNE) 100 MG tablet Take 1 tablet (100 mg total) by mouth 2 (two) times daily. 60 tablet 1  . oxyCODONE-acetaminophen (PERCOCET/ROXICET) 5-325 MG tablet Take 1 tablet by mouth every 4 (four) hours as needed for moderate pain. 15 tablet 0  . Prenatal Vit-Fe Fumarate-FA (PRENATAL VITAMIN) 27-0.8 MG TABS Take 1 tablet by mouth daily.     Marland Kitchen propylthiouracil (PTU) 50 MG tablet Take 2 tablets (100 mg total) by mouth 3 (three) times daily. 90 tablet 6  . Respiratory Therapy Supplies (NEBULIZER) DEVI 1 Device by Does not apply route every 4 (four) hours as needed. 1 each 0  . sertraline (ZOLOFT) 50 MG tablet Take 1 tablet (50 mg total) by mouth daily. 30 tablet 1   No current facility-administered medications on file prior to visit.     Objective:     Vitals:   05/14/19 1336  BP: (!) 147/84  Pulse: (!) 109               Abdomen: Soft.  Non-tender.  No masses.  No HSM.  Incision/s: Intact.  Healing well.  No erythema.  No drainage.      Assessment:    [id number] Patient Active Problem List   Diagnosis Date Noted  . Chronic hypertension with superimposed preeclampsia 05/01/2019  . Supervision of high risk pregnancy, antepartum 01/16/2019  . Diabetes mellitus complicating pregnancy 71/21/9758  . 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 Tachycardia 01/09/2019  . Mixed hyperlipidemia 01/09/2019  . Hyperthyroidism affecting pregnancy in second trimester 01/07/2019  . Lead exposure 12/06/2018  . Smoker 12/06/2018  . Bipolar 2 disorder ([place]) 12/06/2018  . History of anxiety 12/06/2018  . Mild intermittent asthma without complication 83/25/4982  . Type 2 diabetes mellitus without complication, without long-term current use of insulin ([place]) 12/06/2018  . High-risk pregnancy in second trimester  12/06/2018  . History of heart attack 12/06/2018  . Graves disease 10/30/2018  . History of cesarean section 10/30/2018  . Nausea/vomiting in pregnancy 10/30/2018  . History of marijuana use 10/30/2018  . HYPERCHOLESTEROLEMIA 12/08/2009  . HIDRADENITIS SUPPURATIVA 12/08/2009  . VITAMIN D DEFICIENCY 11/18/2009  . GERD 11/18/2009  . MENORRHAGIA 11/18/2009  . Diabetes mellitus type 2 with complications ([place]) 64/15/8309  . HYPERTENSION 11/17/2009  . ASTHMA 11/17/2009     1. Post-operative state     Patient doing well postop  Hypertension controlled  Gestational diabetes-controlled  Depression -currently not an issue  Wound care without issue.   Plan:            1.  Continue current management follow-up in 5 weeks consider glucose challenge testing for diabetes. Orders No orders of the defined types were placed in this encounter.   No orders of the defined types were placed in this encounter.     F/U  Return in about 5 weeks (around 06/18/2019).  Finis Bud, M.D. 05/14/2019 1:50 PM

## 2019-05-14 NOTE — Progress Notes (Signed)
Patient comes in today for 1 week incision check. No concerns today.

## 2019-06-04 ENCOUNTER — Telehealth: Payer: Self-pay | Admitting: Obstetrics and Gynecology

## 2019-06-04 NOTE — Telephone Encounter (Signed)
The patient called and stated that she is experiencing post partum foot swelling and severe headache. The patient is concerned and is hoping to speak with a nurse as soon as possible. Please advise.

## 2019-06-04 NOTE — Telephone Encounter (Signed)
LM for patient to return call.

## 2019-06-05 NOTE — Telephone Encounter (Signed)
Lm for patient to return call.

## 2019-06-09 NOTE — Telephone Encounter (Signed)
Patient never returned call  

## 2019-06-24 ENCOUNTER — Encounter: Payer: Medicaid Other | Admitting: Obstetrics and Gynecology

## 2019-08-04 ENCOUNTER — Ambulatory Visit (INDEPENDENT_AMBULATORY_CARE_PROVIDER_SITE_OTHER): Payer: Medicaid Other | Admitting: Obstetrics and Gynecology

## 2019-08-04 ENCOUNTER — Encounter: Payer: Self-pay | Admitting: Obstetrics and Gynecology

## 2019-08-04 ENCOUNTER — Other Ambulatory Visit: Payer: Self-pay

## 2019-08-04 VITALS — BP 128/74 | HR 88 | Ht 67.0 in | Wt 186.6 lb

## 2019-08-04 DIAGNOSIS — I252 Old myocardial infarction: Secondary | ICD-10-CM

## 2019-08-04 DIAGNOSIS — N926 Irregular menstruation, unspecified: Secondary | ICD-10-CM

## 2019-08-04 DIAGNOSIS — Z98891 History of uterine scar from previous surgery: Secondary | ICD-10-CM | POA: Diagnosis not present

## 2019-08-04 LAB — POCT URINE PREGNANCY: Preg Test, Ur: POSITIVE — AB

## 2019-08-04 NOTE — Progress Notes (Signed)
HPI:      Michelle Osborn is a 33 y.o. G2P2002 who LMP was Patient's last menstrual period was 06/25/2019.  Subjective:   She presents today for pregnancy confirmation.  By last menstrual.  She is approximately [redacted] weeks pregnant.  She is not having any problems at this time.  She is taking prenatal vitamins. Patient's pregnancies have been complicated by gestational hypertension, gestational diabetes, repeat cesarean delivery. Of significant note patient has a history of coronary artery disease resulting in heart attack.  Patient saw cardiologist last pregnancy and no significant medical issues were found.    Hx: The following portions of the patient's history were reviewed and updated as appropriate:             She  has a past medical history of Asthma, Diabetes mellitus, Hypertension, Hyperthyroidism, MI (myocardial infarction) ([facility]), Neuropathy, Tachycardia, and Thyroid disease. She does not have any pertinent problems on file. She  has a past surgical history that includes Appendectomy; Tonsillectomy; Adenoidectomy; Other surgical history; Dilation and curettage of uterus; Cardiac catheterization (03/2016); Coronary angioplasty with stent (03/2016); Cesarean section; and Cesarean section (N/A, 05/05/2019). Her family history includes Asthma in her mother; Cancer in her maternal aunt and maternal grandmother; Diabetes in her mother; Heart disease in her father and mother; Heart failure in her mother; Hyperlipidemia in her mother; Hypertension in her father and mother; Kidney disease in her father. She  reports that she has been smoking. She has a 5.00 pack-year smoking history. She has never used smokeless tobacco. She reports current drug use. Drug: Marijuana. She reports that she does not drink alcohol. She has a current medication list which includes the following prescription(s): albuterol, epinephrine, labetalol, prenatal vitamin, and nebulizer. She is allergic to darvocet [propoxyphene  n-acetaminophen]; peanut-containing drug products; penicillins; cephalexin; tramadol; and toradol [ketorolac tromethamine].       Review of Systems:  Review of Systems  Constitutional: Denied constitutional symptoms, night sweats, recent illness, fatigue, fever, insomnia and weight loss.  Eyes: Denied eye symptoms, eye pain, photophobia, vision change and visual disturbance.  Ears/Nose/Throat/Neck: Denied ear, nose, throat or neck symptoms, hearing loss, nasal discharge, sinus congestion and sore throat.  Cardiovascular: Denied cardiovascular symptoms, arrhythmia, chest pain/pressure, edema, exercise intolerance, orthopnea and palpitations.  Respiratory: Denied pulmonary symptoms, asthma, pleuritic pain, productive sputum, cough, dyspnea and wheezing.  Gastrointestinal: Denied, gastro-esophageal reflux, melena, nausea and vomiting.  Genitourinary: Denied genitourinary symptoms including symptomatic vaginal discharge, pelvic relaxation issues, and urinary complaints.  Musculoskeletal: Denied musculoskeletal symptoms, stiffness, swelling, muscle weakness and myalgia.  Dermatologic: Denied dermatology symptoms, rash and scar.  Neurologic: Denied neurology symptoms, dizziness, headache, neck pain and syncope.  Psychiatric: Denied psychiatric symptoms, anxiety and depression.  Endocrine: Denied endocrine symptoms including hot flashes and night sweats.   Meds:   Current Outpatient Medications on File Prior to Visit  Medication Sig Dispense Refill  . albuterol (PROVENTIL HFA;VENTOLIN HFA) 108 (90 Base) MCG/ACT inhaler Inhale 2 puffs into the lungs every 4 (four) hours as needed for wheezing or shortness of breath. 1 Inhaler 2  . EPINEPHrine 0.3 mg/0.3 mL IJ SOAJ injection Inject 0.3 mg into the muscle as needed for anaphylaxis.     Marland Kitchen labetalol (NORMODYNE) 100 MG tablet Take 1 tablet (100 mg total) by mouth 2 (two) times daily. 60 tablet 1  . Prenatal Vit-Fe Fumarate-FA (PRENATAL VITAMIN) 27-0.8 MG  TABS Take 1 tablet by mouth daily.     Marland Kitchen Respiratory Therapy Supplies (NEBULIZER) DEVI 1 Device by Does  not apply route every 4 (four) hours as needed. (Patient not taking: Reported on 08/04/2019) 1 each 0   No current facility-administered medications on file prior to visit.     Objective:     Vitals:   08/04/19 1044  BP: 128/74  Pulse: 88              UPT positive  Assessment:    G2P2002 Patient Active Problem List   Diagnosis Date Noted  . Chronic hypertension with superimposed preeclampsia 05/01/2019  . Supervision of high risk pregnancy, antepartum 01/16/2019  . Diabetes mellitus complicating pregnancy 67/34/1937  . 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 Tachycardia 01/09/2019  . Mixed hyperlipidemia 01/09/2019  . Hyperthyroidism affecting pregnancy in second trimester 01/07/2019  . Lead exposure 12/06/2018  . Smoker 12/06/2018  . Bipolar 2 disorder ([place]) 12/06/2018  . History of anxiety 12/06/2018  . Mild intermittent asthma without complication 90/24/0973  . Type 2 diabetes mellitus without complication, without long-term current use of insulin ([place]) 12/06/2018  . High-risk pregnancy in second trimester 12/06/2018  . History of heart attack 12/06/2018  . Graves disease 10/30/2018  . History of cesarean section 10/30/2018  . Nausea/vomiting in pregnancy 10/30/2018  . History of marijuana use 10/30/2018  . HYPERCHOLESTEROLEMIA 12/08/2009  . HIDRADENITIS SUPPURATIVA 12/08/2009  . VITAMIN D DEFICIENCY 11/18/2009  . GERD 11/18/2009  . MENORRHAGIA 11/18/2009  . Diabetes mellitus type 2 with complications ([place]) 53/29/9242  . HYPERTENSION 11/17/2009  . ASTHMA 11/17/2009     1. Missed period   2. History of cesarean section   3. History of heart attack     Early first trimester pregnancy in a patient with a history of gestational hypertension gestational diabetes and prior cesarean delivery.   Plan:             Prenatal Plan 1.  The patient was given prenatal literature. 2.  She was continued on prenatal vitamins. 3.  A prenatal lab panel was ordered or drawn. 4.  An ultrasound was ordered to better determine an EDC. 5.  A nurse visit was scheduled. 6.  Genetic testing and testing for other inheritable conditions discussed in detail. She will decide in the future whether to have these labs performed. 7.  A general overview of pregnancy testing, visit schedule, ultrasound schedule, and prenatal care was discussed. 8.  Hemoglobin A1c with prenatal labs-early 1 hour GCT 18 weeks 9.  Begin ASA 81 approximately 14 weeks 10.  Plan repeat cesarean delivery at term Orders Orders Placed This Encounter  Procedures  . US OB Comp Less 14 Wks  . US OB Comp Less 14 Wks  . POC Urinalysis Dipstick OB    No orders of the defined types were placed in this encounter.     F/U  Return in about 6 weeks (around 09/15/2019). I spent 28 minutes involved in the care of this patient of which greater than 50% was spent discussing prenatal care, prenatal history, history of heart disease and heart attack, repeat cesarean delivery, possible tubal ligation, all questions answered.  Finis Bud, M.D. 08/04/2019 11:29 AM

## 2019-08-04 NOTE — Progress Notes (Signed)
Patient comes in today for pregnancy confirmation. UPT was done today that was positive. LMP was 06/25/2019. EDD is 03/31/2020. No nausea or vomiting yet.

## 2019-08-11 ENCOUNTER — Other Ambulatory Visit: Payer: Medicaid Other

## 2019-08-17 ENCOUNTER — Other Ambulatory Visit: Payer: Self-pay

## 2019-08-17 ENCOUNTER — Encounter (HOSPITAL_COMMUNITY): Payer: Self-pay

## 2019-08-17 ENCOUNTER — Emergency Department (HOSPITAL_COMMUNITY)
Admission: EM | Admit: 2019-08-17 | Discharge: 2019-08-17 | Disposition: A | Discharge status: Home / Self Care | Payer: Medicaid Other | Attending: Emergency Medicine | Admitting: Emergency Medicine

## 2019-08-17 DIAGNOSIS — Z5321 Procedure and treatment not carried out due to patient leaving prior to being seen by health care provider: Secondary | ICD-10-CM | POA: Insufficient documentation

## 2019-08-17 DIAGNOSIS — R110 Nausea: Secondary | ICD-10-CM | POA: Insufficient documentation

## 2019-08-17 DIAGNOSIS — O9989 Other specified diseases and conditions complicating pregnancy, childbirth and the puerperium: Secondary | ICD-10-CM | POA: Diagnosis present

## 2019-08-17 NOTE — ED Triage Notes (Signed)
Pt BIBA from [place] (for shelter). Pt is waiting COVID results.  Pt is [redacted] weeks pregnant. Pt c/o nausea x 6 days . Pt c/o emesis with mucous from irritation.  Pt is established with OBGYN.  Hx of Pre- eclampsia.

## 2019-08-18 ENCOUNTER — Other Ambulatory Visit: Payer: Medicaid Other

## 2019-08-19 ENCOUNTER — Telehealth: Payer: Self-pay | Admitting: Obstetrics and Gynecology

## 2019-08-19 MED ORDER — DOXYLAMINE-PYRIDOXINE 10-10 MG PO TBEC: 10.0000 mg | DELAYED_RELEASE_TABLET | Freq: Every day | ORAL | 1 refills | Status: DC

## 2019-08-19 NOTE — Telephone Encounter (Signed)
Pt aware [facility] will not pay for bonjesta. Diclegis erxed. Pt advised to take 2 at bed time. 1 in the am and 1 pm. No more than 4qd.

## 2019-08-19 NOTE — Telephone Encounter (Signed)
Please advise. [facility] is the only one that has seen this pt since her 2nd pregnancy.  Thanks [employer]

## 2019-08-19 NOTE — Telephone Encounter (Signed)
Patient called requesting a script for bonjesta . She has severe morning sickness and has been seen in the emergency room for the nausea recently. [facility] [address] in [place].Thanks

## 2019-08-20 ENCOUNTER — Other Ambulatory Visit: Payer: Self-pay

## 2019-08-20 MED ORDER — DICLEGIS 10-10 MG PO TBEC: 10.0000 mg | DELAYED_RELEASE_TABLET | Freq: Four times a day (QID) | ORAL | 1 refills | Status: DC

## 2019-08-26 ENCOUNTER — Other Ambulatory Visit: Payer: Medicaid Other

## 2019-08-28 ENCOUNTER — Telehealth: Payer: Self-pay | Admitting: Obstetrics and Gynecology

## 2019-08-28 NOTE — Telephone Encounter (Signed)
The patient called and stated that she has a UTI and needs a prescription sent to her pharmacy today if possible. Please advise.

## 2019-08-28 NOTE — Telephone Encounter (Signed)
LM for patient to call back to see what symptoms she is having.

## 2019-08-29 ENCOUNTER — Encounter (HOSPITAL_COMMUNITY): Payer: Self-pay

## 2019-08-29 ENCOUNTER — Other Ambulatory Visit: Payer: Self-pay

## 2019-08-29 ENCOUNTER — Emergency Department (HOSPITAL_COMMUNITY): Payer: Medicaid Other

## 2019-08-29 ENCOUNTER — Observation Stay (HOSPITAL_COMMUNITY)
Admission: EM | Admit: 2019-08-29 | Discharge: 2019-08-31 | Disposition: A | Discharge status: Home / Self Care | Payer: Medicaid Other | Attending: Internal Medicine | Admitting: Internal Medicine

## 2019-08-29 DIAGNOSIS — R0789 Other chest pain: Secondary | ICD-10-CM | POA: Diagnosis not present

## 2019-08-29 DIAGNOSIS — E118 Type 2 diabetes mellitus with unspecified complications: Secondary | ICD-10-CM | POA: Diagnosis present

## 2019-08-29 DIAGNOSIS — I2510 Atherosclerotic heart disease of native coronary artery without angina pectoris: Secondary | ICD-10-CM | POA: Diagnosis not present

## 2019-08-29 DIAGNOSIS — R079 Chest pain, unspecified: Secondary | ICD-10-CM

## 2019-08-29 DIAGNOSIS — E0590 Thyrotoxicosis, unspecified without thyrotoxic crisis or storm: Secondary | ICD-10-CM | POA: Insufficient documentation

## 2019-08-29 DIAGNOSIS — O24911 Unspecified diabetes mellitus in pregnancy, first trimester: Secondary | ICD-10-CM | POA: Insufficient documentation

## 2019-08-29 DIAGNOSIS — O99411 Diseases of the circulatory system complicating pregnancy, first trimester: Secondary | ICD-10-CM | POA: Insufficient documentation

## 2019-08-29 DIAGNOSIS — O99281 Endocrine, nutritional and metabolic diseases complicating pregnancy, first trimester: Secondary | ICD-10-CM | POA: Diagnosis not present

## 2019-08-29 DIAGNOSIS — O99511 Diseases of the respiratory system complicating pregnancy, first trimester: Secondary | ICD-10-CM | POA: Insufficient documentation

## 2019-08-29 DIAGNOSIS — Z3A09 9 weeks gestation of pregnancy: Secondary | ICD-10-CM | POA: Insufficient documentation

## 2019-08-29 DIAGNOSIS — O99331 Smoking (tobacco) complicating pregnancy, first trimester: Secondary | ICD-10-CM | POA: Insufficient documentation

## 2019-08-29 DIAGNOSIS — J45909 Unspecified asthma, uncomplicated: Secondary | ICD-10-CM | POA: Diagnosis not present

## 2019-08-29 DIAGNOSIS — Z8249 Family history of ischemic heart disease and other diseases of the circulatory system: Secondary | ICD-10-CM | POA: Insufficient documentation

## 2019-08-29 DIAGNOSIS — Z881 Allergy status to other antibiotic agents status: Secondary | ICD-10-CM | POA: Diagnosis not present

## 2019-08-29 DIAGNOSIS — O161 Unspecified maternal hypertension, first trimester: Secondary | ICD-10-CM | POA: Insufficient documentation

## 2019-08-29 DIAGNOSIS — Z20828 Contact with and (suspected) exposure to other viral communicable diseases: Secondary | ICD-10-CM | POA: Insufficient documentation

## 2019-08-29 DIAGNOSIS — E1140 Type 2 diabetes mellitus with diabetic neuropathy, unspecified: Secondary | ICD-10-CM | POA: Diagnosis not present

## 2019-08-29 DIAGNOSIS — I252 Old myocardial infarction: Secondary | ICD-10-CM | POA: Diagnosis not present

## 2019-08-29 DIAGNOSIS — F17210 Nicotine dependence, cigarettes, uncomplicated: Secondary | ICD-10-CM | POA: Diagnosis not present

## 2019-08-29 DIAGNOSIS — O09891 Supervision of other high risk pregnancies, first trimester: Principal | ICD-10-CM | POA: Insufficient documentation

## 2019-08-29 DIAGNOSIS — Z885 Allergy status to narcotic agent status: Secondary | ICD-10-CM | POA: Diagnosis not present

## 2019-08-29 DIAGNOSIS — Z880 Allergy status to penicillin: Secondary | ICD-10-CM | POA: Diagnosis not present

## 2019-08-29 DIAGNOSIS — Z955 Presence of coronary angioplasty implant and graft: Secondary | ICD-10-CM | POA: Insufficient documentation

## 2019-08-29 DIAGNOSIS — Z833 Family history of diabetes mellitus: Secondary | ICD-10-CM | POA: Insufficient documentation

## 2019-08-29 DIAGNOSIS — F17200 Nicotine dependence, unspecified, uncomplicated: Secondary | ICD-10-CM | POA: Diagnosis present

## 2019-08-29 LAB — COMPREHENSIVE METABOLIC PANEL
ALT: 25 U/L (ref 0–44)
AST: 20 U/L (ref 15–41)
Albumin: 3.6 g/dL (ref 3.5–5.0)
Alkaline Phosphatase: 106 U/L (ref 38–126)
Anion gap: 11 (ref 5–15)
BUN: 8 mg/dL (ref 6–20)
CO2: 16 mmol/L — ABNORMAL LOW (ref 22–32)
Calcium: 9.3 mg/dL (ref 8.9–10.3)
Chloride: 108 mmol/L (ref 98–111)
Creatinine, Ser: 0.61 mg/dL (ref 0.44–1.00)
GFR calc Af Amer: >60 mL/min (ref >60)
GFR calc non Af Amer: >60 mL/min (ref >60)
Glucose, Bld: 87 mg/dL (ref 70–99)
Potassium: 3.8 mmol/L (ref 3.5–5.1)
Sodium: 135 mmol/L (ref 135–145)
Total Bilirubin: 0.4 mg/dL (ref 0.3–1.2)
Total Protein: 6.6 g/dL (ref 6.5–8.1)

## 2019-08-29 LAB — URINALYSIS, ROUTINE W REFLEX MICROSCOPIC
Bilirubin Urine: NEGATIVE
Glucose, UA: NEGATIVE mg/dL
Hgb urine dipstick: NEGATIVE
Ketones, ur: 80 mg/dL — AB
Nitrite: NEGATIVE
Protein, ur: NEGATIVE mg/dL
Specific Gravity, Urine: 1.02 (ref 1.005–1.030)
pH: 5 (ref 5.0–8.0)

## 2019-08-29 LAB — CBC WITH DIFFERENTIAL/PLATELET
Abs Immature Granulocytes: 0.02 10*3/uL (ref 0.00–0.07)
Basophils Absolute: 0 10*3/uL (ref 0.0–0.1)
Basophils Relative: 0 %
Eosinophils Absolute: 0 10*3/uL (ref 0.0–0.5)
Eosinophils Relative: 0 %
HCT: 34.6 % — ABNORMAL LOW (ref 36.0–46.0)
Hemoglobin: 11.5 g/dL — ABNORMAL LOW (ref 12.0–15.0)
Immature Granulocytes: 0 %
Lymphocytes Relative: 35 %
Lymphs Abs: 3.3 10*3/uL (ref 0.7–4.0)
MCH: 26.2 pg (ref 26.0–34.0)
MCHC: 33.2 g/dL (ref 30.0–36.0)
MCV: 78.8 fL — ABNORMAL LOW (ref 80.0–100.0)
Monocytes Absolute: 0.6 10*3/uL (ref 0.1–1.0)
Monocytes Relative: 6 %
Neutro Abs: 5.5 10*3/uL (ref 1.7–7.7)
Neutrophils Relative %: 59 %
Platelets: 250 10*3/uL (ref 150–400)
RBC: 4.39 MIL/uL (ref 3.87–5.11)
RDW: 14 % (ref 11.5–15.5)
WBC: 9.4 10*3/uL (ref 4.0–10.5)
nRBC: 0 % (ref 0.0–0.2)

## 2019-08-29 LAB — TROPONIN I (HIGH SENSITIVITY)
Troponin I (High Sensitivity): 6 ng/L (ref <18)
Troponin I (High Sensitivity): 8 ng/L (ref <18)

## 2019-08-29 LAB — PREGNANCY, URINE: Preg Test, Ur: POSITIVE — AB

## 2019-08-29 MED ORDER — SODIUM CHLORIDE 0.9 % IV BOLUS
1000.0000 mL | Freq: Once | INTRAVENOUS | Status: AC
  Administered 2019-08-29: 1000 mL via INTRAVENOUS

## 2019-08-29 MED ORDER — NITROGLYCERIN 0.4 MG SL SUBL: 0.4000 mg | SUBLINGUAL_TABLET | SUBLINGUAL | Status: DC | PRN

## 2019-08-29 MED ORDER — MORPHINE SULFATE (PF) 2 MG/ML IV SOLN
2.0000 mg | Freq: Once | INTRAVENOUS | Status: AC
  Administered 2019-08-29: 2 mg via INTRAVENOUS
  Filled 2019-08-29: qty 1

## 2019-08-29 MED ORDER — ONDANSETRON HCL 4 MG/2ML IJ SOLN
4.0000 mg | Freq: Once | INTRAMUSCULAR | Status: AC
  Administered 2019-08-29: 4 mg via INTRAVENOUS
  Filled 2019-08-29: qty 2

## 2019-08-29 MED ORDER — ONDANSETRON HCL 4 MG/2ML IJ SOLN
4.0000 mg | Freq: Once | INTRAMUSCULAR | Status: AC
  Administered 2019-08-29: 20:00:00 4 mg via INTRAVENOUS
  Filled 2019-08-29: qty 2

## 2019-08-29 MED ORDER — SODIUM CHLORIDE 0.9 % IV BOLUS
1000.0000 mL | Freq: Once | INTRAVENOUS | Status: AC
  Administered 2019-08-29: 20:00:00 1000 mL via INTRAVENOUS

## 2019-08-29 MED ORDER — MORPHINE SULFATE (PF) 4 MG/ML IV SOLN
4.0000 mg | Freq: Once | INTRAVENOUS | Status: AC
  Administered 2019-08-29: 4 mg via INTRAVENOUS
  Filled 2019-08-29: qty 1

## 2019-08-29 NOTE — ED Provider Notes (Signed)
[facility] Provider Note   CSN: [id number] Arrival date & time: 08/29/19  1910     History   Chief Complaint Chief Complaint  Patient presents with  . Chest Pain    HPI Michelle Osborn is a 33 y.o. female.     The history is provided by the patient. No language interpreter was used.  Chest Pain Pain location:  L chest Pain quality: sharp and stabbing   Pain radiates to:  Does not radiate Pain severity:  Moderate Onset quality:  Gradual Duration:  1 day Timing:  Constant Progression:  Worsening Chronicity:  New Relieved by:  Nothing Worsened by:  Exertion Ineffective treatments:  None tried Associated symptoms: no cough   Risk factors: coronary artery disease, high cholesterol, hypertension and pregnancy   Pt has left sided chest pain.  Pt reports pain is worse than when she had an mi 3 years ago.  Pt had 2 stents placed in 2017.    Past Medical History:  Diagnosis Date  . Asthma   . Diabetes mellitus   . Hypertension   . Hyperthyroidism   . MI (myocardial infarction) (HCC)   . Neuropathy   . Tachycardia   . Thyroid disease     Patient Active Problem List   Diagnosis Date Noted  . Chronic hypertension with superimposed preeclampsia 05/01/2019  . Supervision of high risk pregnancy, antepartum 01/16/2019  . Diabetes mellitus complicating pregnancy 01/16/2019  . 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 Tachycardia 01/09/2019  . Mixed hyperlipidemia 01/09/2019  . Hyperthyroidism affecting pregnancy in second trimester 01/07/2019  . Lead exposure 12/06/2018  . Smoker 12/06/2018  . Bipolar 2 disorder (HCC) 12/06/2018  . History of anxiety 12/06/2018  . Mild intermittent asthma without complication 12/06/2018  . Type 2 diabetes mellitus without complication, without long-term current use of insulin (HCC) 12/06/2018  . High-risk pregnancy in second trimester 12/06/2018  .  History of heart attack 12/06/2018  . Graves disease 10/30/2018  . History of cesarean section 10/30/2018  . Nausea/vomiting in pregnancy 10/30/2018  . History of marijuana use 10/30/2018  . HYPERCHOLESTEROLEMIA 12/08/2009  . HIDRADENITIS SUPPURATIVA 12/08/2009  . VITAMIN D DEFICIENCY 11/18/2009  . GERD 11/18/2009  . MENORRHAGIA 11/18/2009  . Diabetes mellitus type 2 with complications (HCC) 11/17/2009  . HYPERTENSION 11/17/2009  . ASTHMA 11/17/2009    Past Surgical History:  Procedure Laterality Date  . ADENOIDECTOMY    . APPENDECTOMY    . CARDIAC [place]HE[place]  03[place]2016   [place]   . CESAREAN SECTION    . CESAREAN SECTION N/A 05/05/2019   Procedure: REPEAT CESAREAN SECTION;  Surgeon: Linzie CollinEvans, David James, MD;  Location: [facility] ORS;  Service: Obstetrics;  Laterality: N/A;  . CORONARY ANGIOPLASTY WITH STENT PLACEMENT  03/2016  . DILATION AND CURETTAGE OF UTERUS    . OTHER SURGICAL HISTORY     sweat gland excision  . TONSILLECTOMY       OB History    Gravida  3   Para  2   Term  2   Preterm      AB      Living  2     SAB      TAB      Ectopic      Multiple  0   Live Births  2            Home Medications    Prior to Admission medications  Medication Sig Start Date End Date Taking? Authorizing Provider  DICLEGIS 10-10 MG TBEC Take 10 mg by mouth 4 (four) times daily. 08/20/19  Yes Harlin Heys, MD    Family History Family History  Problem Relation Age of Onset  . Hypertension Mother   . Asthma Mother   . Diabetes Mother   . Hyperlipidemia Mother   . Heart disease Mother   . Heart failure Mother        Defib placement  . Hypertension Father   . Kidney disease Father   . Heart disease Father   . Cancer Maternal Aunt   . Cancer Maternal Grandmother     Social History Social History   Tobacco Use  . Smoking status: Current Every Day Smoker    Packs/day: 0.50    Years: 10.00    Pack years: 5.00  . Smokeless tobacco: Never Used   Substance Use Topics  . Alcohol use: No  . Drug use: Yes    Types: Marijuana    Comment: use every couple days     Allergies   Darvocet [propoxyphene n-acetaminophen], Peanut-containing drug products, Penicillins, Cephalexin, Tramadol, and Toradol [ketorolac tromethamine]   Review of Systems Review of Systems  Respiratory: Negative for cough.   Cardiovascular: Positive for chest pain.  All other systems reviewed and are negative.    Physical Exam Updated Vital Signs BP 129/82   Pulse 98   Resp 20   Ht 5\' 7"  (1.702 m)   Wt 84.4 kg   LMP 06/25/2019   SpO2 100%   BMI 29.13 kg/m   Physical Exam Vitals signs and nursing note reviewed.  Constitutional:      Appearance: She is well-developed.  HENT:     Head: Normocephalic.  Neck:     Musculoskeletal: Normal range of motion.  Cardiovascular:     Rate and Rhythm: Normal rate and regular rhythm.     Heart sounds: Normal heart sounds.  Pulmonary:     Effort: Pulmonary effort is normal.     Breath sounds: Normal breath sounds.  Abdominal:     General: There is no distension.     Palpations: Abdomen is soft.  Musculoskeletal: Normal range of motion.  Skin:    General: Skin is warm.  Neurological:     General: No focal deficit present.     Mental Status: She is alert and oriented to person, place, and time.  Psychiatric:        Mood and Affect: Mood normal.      ED Treatments / Results  Labs (all labs ordered are listed, but only abnormal results are displayed) Labs Reviewed  CBC WITH DIFFERENTIAL/PLATELET - Abnormal; Notable for the following components:      Result Value   Hemoglobin 11.5 (*)    HCT 34.6 (*)    MCV 78.8 (*)    All other components within normal limits  COMPREHENSIVE METABOLIC PANEL - Abnormal; Notable for the following components:   CO2 16 (*)    All other components within normal limits  URINALYSIS, ROUTINE W REFLEX MICROSCOPIC - Abnormal; Notable for the following components:    Ketones, ur 80 (*)    Leukocytes,Ua TRACE (*)    Bacteria, UA RARE (*)    All other components within normal limits  PREGNANCY, URINE  TROPONIN I (HIGH SENSITIVITY)  TROPONIN I (HIGH SENSITIVITY)    EKG EKG Interpretation  Date/Time:  Saturday August 29 2019 19:14:40 EDT Ventricular Rate:  97 PR Interval:  QRS Duration: 81 QT Interval:  346 QTC Calculation: 440 R Axis:   47 Text Interpretation:  Sinus rhythm Short PR interval Probable left atrial enlargement No STEMI  Confirmed by Alona Bene [phone]) on 08/29/2019 7:18:52 PM   Radiology Dg Chest Portable 1 View  Result Date: 08/29/2019 CLINICAL DATA:  Acute chest pain today. EXAM: PORTABLE CHEST 1 VIEW COMPARISON:  06/12/2018 FINDINGS: The cardiomediastinal silhouette is unremarkable. There is no evidence of focal airspace disease, pulmonary edema, suspicious pulmonary nodule/mass, pleural effusion, or pneumothorax. No acute bony abnormalities are identified. IMPRESSION: No active disease. Electronically Signed   By: Harmon Pier M.D.   On: 08/29/2019 20:08    Procedures Procedures (including critical care time)  Medications Ordered in ED Medications  sodium chloride 0.9 % bolus 1,000 mL (0 mLs Intravenous Stopped 08/29/19 2141)  morphine 2 MG/ML injection 2 mg (2 mg Intravenous Given 08/29/19 2029)  ondansetron (ZOFRAN) injection 4 mg (4 mg Intravenous Given 08/29/19 2027)  sodium chloride 0.9 % bolus 1,000 mL (1,000 mLs Intravenous New Bag/Given 08/29/19 2245)     Initial Impression / Assessment and Plan / ED Course  I have reviewed the triage vital signs and the nursing notes.  Pertinent labs & imaging results that were available during my care of the patient were reviewed by me and considered in my medical decision making (see chart for details).        MDM  Pt had some relief from morphine.  Pt walked to bathroom and pain returned.  Pt reports sharp and stabbing.  Pt complains of continued nausea.   Pt old records  reviewed.  I spoke to cardiology on call.  Dr. Mackie Pai advised medicine admission.  Echo in am.  I spoke to Dr. Selena Batten who will admit   Final Clinical Impressions(s) / ED Diagnoses   Final diagnoses:  Nonspecific chest pain    ED Discharge Orders    None       Osie Cheeks 08/30/19 0006    Cathren Laine, MD 08/31/19 1319

## 2019-08-29 NOTE — ED Triage Notes (Signed)
Ems reports pt started having sharp chest pain around lunch. Pt has history of Mi, stints, and SVT. currently [redacted] weeks pregnant. Ems vitals: 150/92, 130hr, resp22, O2 99% RA. Pt mildly nauseous, and light headed.

## 2019-08-30 ENCOUNTER — Observation Stay (HOSPITAL_BASED_OUTPATIENT_CLINIC_OR_DEPARTMENT_OTHER): Payer: Medicaid Other

## 2019-08-30 ENCOUNTER — Other Ambulatory Visit: Payer: Self-pay

## 2019-08-30 ENCOUNTER — Encounter (HOSPITAL_COMMUNITY): Payer: Self-pay | Admitting: Internal Medicine

## 2019-08-30 DIAGNOSIS — F17200 Nicotine dependence, unspecified, uncomplicated: Secondary | ICD-10-CM

## 2019-08-30 DIAGNOSIS — R0789 Other chest pain: Secondary | ICD-10-CM | POA: Diagnosis not present

## 2019-08-30 DIAGNOSIS — R079 Chest pain, unspecified: Secondary | ICD-10-CM

## 2019-08-30 LAB — CBC
HCT: 31.2 % — ABNORMAL LOW (ref 36.0–46.0)
Hemoglobin: 10.3 g/dL — ABNORMAL LOW (ref 12.0–15.0)
MCH: 26.1 pg (ref 26.0–34.0)
MCHC: 33 g/dL (ref 30.0–36.0)
MCV: 79 fL — ABNORMAL LOW (ref 80.0–100.0)
Platelets: 224 10*3/uL (ref 150–400)
RBC: 3.95 MIL/uL (ref 3.87–5.11)
RDW: 14.1 % (ref 11.5–15.5)
WBC: 7.4 10*3/uL (ref 4.0–10.5)
nRBC: 0 % (ref 0.0–0.2)

## 2019-08-30 LAB — COMPREHENSIVE METABOLIC PANEL
ALT: 23 U/L (ref 0–44)
AST: 19 U/L (ref 15–41)
Albumin: 3.1 g/dL — ABNORMAL LOW (ref 3.5–5.0)
Alkaline Phosphatase: 84 U/L (ref 38–126)
Anion gap: 6 (ref 5–15)
BUN: 5 mg/dL — ABNORMAL LOW (ref 6–20)
CO2: 20 mmol/L — ABNORMAL LOW (ref 22–32)
Calcium: 8.7 mg/dL — ABNORMAL LOW (ref 8.9–10.3)
Chloride: 110 mmol/L (ref 98–111)
Creatinine, Ser: 0.42 mg/dL — ABNORMAL LOW (ref 0.44–1.00)
GFR calc Af Amer: >60 mL/min (ref >60)
GFR calc non Af Amer: >60 mL/min (ref >60)
Glucose, Bld: 96 mg/dL (ref 70–99)
Potassium: 3.8 mmol/L (ref 3.5–5.1)
Sodium: 136 mmol/L (ref 135–145)
Total Bilirubin: 0.6 mg/dL (ref 0.3–1.2)
Total Protein: 5.8 g/dL — ABNORMAL LOW (ref 6.5–8.1)

## 2019-08-30 LAB — T4, FREE: Free T4: 2.47 ng/dL — ABNORMAL HIGH (ref 0.61–1.12)

## 2019-08-30 LAB — TSH: TSH: <0.01 u[IU]/mL — ABNORMAL LOW (ref 0.350–4.500)

## 2019-08-30 LAB — ECHOCARDIOGRAM COMPLETE
Height: 67 in
Weight: 2956.8 oz

## 2019-08-30 LAB — D-DIMER, QUANTITATIVE: D-Dimer, Quant: 0.41 ug/mL-FEU (ref 0.00–0.50)

## 2019-08-30 LAB — HEMOGLOBIN A1C
Hgb A1c MFr Bld: 4.8 % (ref 4.8–5.6)
Mean Plasma Glucose: 91.06 mg/dL

## 2019-08-30 LAB — GLUCOSE, CAPILLARY: Glucose-Capillary: 81 mg/dL (ref 70–99)

## 2019-08-30 LAB — SARS CORONAVIRUS 2 BY RT PCR (HOSPITAL ORDER, PERFORMED IN ~~LOC~~ HOSPITAL LAB): SARS Coronavirus 2: NEGATIVE

## 2019-08-30 MED ORDER — SODIUM CHLORIDE 0.9 % IV SOLN
INTRAVENOUS | Status: DC
  Administered 2019-08-30: 04:00:00 via INTRAVENOUS

## 2019-08-30 MED ORDER — VITAMIN B-6 25 MG PO TABS
12.5000 mg | ORAL_TABLET | Freq: Four times a day (QID) | ORAL | Status: DC
  Administered 2019-08-30 – 2019-08-31 (×4): 12.5 mg via ORAL
  Filled 2019-08-30 (×9): qty 1

## 2019-08-30 MED ORDER — DOXYLAMINE SUCCINATE (SLEEP) 25 MG PO TABS
12.5000 mg | ORAL_TABLET | Freq: Four times a day (QID) | ORAL | Status: DC
  Administered 2019-08-30 – 2019-08-31 (×3): 12.5 mg via ORAL
  Filled 2019-08-30 (×9): qty 1

## 2019-08-30 MED ORDER — ACETAMINOPHEN 650 MG RE SUPP: 650.0000 mg | Freq: Four times a day (QID) | RECTAL | Status: DC | PRN

## 2019-08-30 MED ORDER — SODIUM CHLORIDE 0.9% FLUSH: 3.0000 mL | INTRAVENOUS | Status: DC | PRN

## 2019-08-30 MED ORDER — ENOXAPARIN SODIUM 40 MG/0.4ML ~~LOC~~ SOLN
40.0000 mg | SUBCUTANEOUS | Status: DC
  Administered 2019-08-30: 40 mg via SUBCUTANEOUS
  Filled 2019-08-30: qty 0.4

## 2019-08-30 MED ORDER — CAPSAICIN 0.025 % EX CREA
TOPICAL_CREAM | Freq: Two times a day (BID) | CUTANEOUS | Status: DC
  Administered 2019-08-30 (×2): via TOPICAL
  Filled 2019-08-30: qty 60

## 2019-08-30 MED ORDER — SODIUM CHLORIDE 0.9 % IV SOLN: 250.0000 mL | INTRAVENOUS | Status: DC | PRN

## 2019-08-30 MED ORDER — POLYVINYL ALCOHOL 1.4 % OP SOLN
1.0000 [drp] | OPHTHALMIC | Status: DC | PRN
  Filled 2019-08-30: qty 15

## 2019-08-30 MED ORDER — SODIUM CHLORIDE 0.9% FLUSH
3.0000 mL | Freq: Two times a day (BID) | INTRAVENOUS | Status: DC
  Administered 2019-08-30 – 2019-08-31 (×3): 3 mL via INTRAVENOUS

## 2019-08-30 MED ORDER — DOXYLAMINE-PYRIDOXINE 10-10 MG PO TBEC: 10.0000 mg | DELAYED_RELEASE_TABLET | Freq: Four times a day (QID) | ORAL | Status: DC

## 2019-08-30 MED ORDER — ACETAMINOPHEN 325 MG PO TABS: 650.0000 mg | ORAL_TABLET | Freq: Four times a day (QID) | ORAL | Status: DC | PRN

## 2019-08-30 MED ORDER — ONDANSETRON HCL 4 MG/2ML IJ SOLN
4.0000 mg | Freq: Four times a day (QID) | INTRAMUSCULAR | Status: DC | PRN
  Administered 2019-08-30 – 2019-08-31 (×3): 4 mg via INTRAVENOUS
  Filled 2019-08-30 (×3): qty 2

## 2019-08-30 NOTE — Plan of Care (Signed)

## 2019-08-30 NOTE — Progress Notes (Signed)
  Echocardiogram 2D Echocardiogram has been performed.  Johny Chess 08/30/2019, 11:58 AM

## 2019-08-30 NOTE — Progress Notes (Signed)
Patient arrived to unit, ambulated to bed. Patient says she's having chest pain that has never eased off. Patient A&O x4, VSS. Will continue to monitor.

## 2019-08-30 NOTE — ED Notes (Signed)
ED TO INPATIENT HANDOFF REPORT  ED Nurse Name and Phone #: [phone]  S Name/Age/Gender Michelle Osborn 33 y.o. female Room/Bed: 030C/030C  Code Status   Code Status: Full Code  Home/SNF/Other Home Patient oriented to: self, place, time and situation Is this baseline? Yes   Triage Complete: Triage complete  Chief Complaint chest pain, pregnant   Triage Note Ems reports pt started having sharp chest pain around lunch. Pt has history of Mi, stints, and SVT. currently [redacted] weeks pregnant. Ems vitals: 150/92, 130hr, resp22, O2 99% RA. Pt mildly nauseous, and light headed.    Allergies Allergies  Allergen Reactions  . Darvocet [Propoxyphene N-Acetaminophen] Anaphylaxis    Also makes her stomach hurt  . Peanut-Containing Drug Products Shortness Of Breath and Swelling  . Penicillins Shortness Of Breath and Swelling    Did it involve swelling of the face/tongue/throat, SOB, or low BP? Yes Did it involve sudden or severe rash/hives, skin peeling, or any reaction on the inside of your mouth or nose? No Did you need to seek medical attention at a hospital or doctor's office? Yes When did it last happen?2007 If all above answers are "NO", may proceed with cephalosporin use.   . Cephalexin Hives and Itching  . Tramadol Nausea And Vomiting  . Toradol [Ketorolac Tromethamine] Rash    Red rash with bumps    Level of Care/Admitting Diagnosis ED Disposition    ED Disposition Condition Comment   Admit  [facility] [[id number][facility]  Level of Care: Telemetry Cardiac [103]  I expect the patient will be discharged within 24 hours: No (not a candidate for 5C-Observation unit)  Covid Evaluation: Asymptomatic Screening Protocol (No Symptoms)  Diagnosis: Chest pain [454098][744799]  Admitting Physician: Pearson GrippeKIM, JAMES [[id number]]  Attending Physician: Pearson GrippeKIM, JAMES [[id number]]  PT Class (Do Not Modify): Observation [104]  PT Acc Code (Do Not Modify): Observation [10022]        B Medical/Surgery History Past Medical History:  Diagnosis Date  . Asthma   . Diabetes mellitus   . Hypertension   . Hyperthyroidism   . MI (myocardial infarction) (HCC) 2017  . Neuropathy   . Tachycardia   . Thyroid disease    Past Surgical History:  Procedure Laterality Date  . ADENOIDECTOMY    . APPENDECTOMY    . CARDIAC CATHETERIZATION  03/2016   [place]   . CESAREAN SECTION    . CESAREAN SECTION N/A 05/05/2019   Procedure: REPEAT CESAREAN SECTION;  Surgeon: Linzie CollinEvans, David James, MD;  Location: [facility] ORS;  Service: Obstetrics;  Laterality: N/A;  . CORONARY ANGIOPLASTY WITH STENT PLACEMENT  03/2016  . DILATION AND CURETTAGE OF UTERUS    . OTHER SURGICAL HISTORY     sweat gland excision  . TONSILLECTOMY       A IV Location/Drains/Wounds Patient Lines/Drains/Airways Status   Active Line/Drains/Airways    Name:   Placement date:   Placement time:   Site:   Days:   Peripheral IV 05/05/19 Right Hand   05/05/19    0600    Hand   117   Peripheral IV 08/17/19 Right Antecubital   08/17/19    1525    Antecubital   13   Peripheral IV Left Antecubital   -    -    Antecubital      Incision (Closed) 05/05/19 Abdomen   05/05/19    0831     117          Intake/Output Last  24 hours No intake or output data in the 24 hours ending 08/30/19 0013  Labs/Imaging Results for orders placed or performed during the hospital encounter of 08/29/19 (from the past 48 hour(s))  CBC with Differential     Status: Abnormal   Collection Time: 08/29/19  7:29 PM  Result Value Ref Range   WBC 9.4 4.0 - 10.5 K/uL   RBC 4.39 3.87 - 5.11 MIL/uL   Hemoglobin 11.5 (L) 12.0 - 15.0 g/dL   HCT 40.934.6 (L) 81.136.0 - 91.446.0 %   MCV 78.8 (L) 80.0 - 100.0 fL   MCH 26.2 26.0 - 34.0 pg   MCHC 33.2 30.0 - 36.0 g/dL   RDW 78.214.0 95.611.5 - 21.315.5 %   Platelets 250 150 - 400 K/uL   nRBC 0.0 0.0 - 0.2 %   Neutrophils Relative % 59 %   Neutro Abs 5.5 1.7 - 7.7 K/uL   Lymphocytes Relative 35 %   Lymphs Abs 3.3 0.7 -  4.0 K/uL   Monocytes Relative 6 %   Monocytes Absolute 0.6 0.1 - 1.0 K/uL   Eosinophils Relative 0 %   Eosinophils Absolute 0.0 0.0 - 0.5 K/uL   Basophils Relative 0 %   Basophils Absolute 0.0 0.0 - 0.1 K/uL   Immature Granulocytes 0 %   Abs Immature Granulocytes 0.02 0.00 - 0.07 K/uL    Comment: Performed at [facility], [address] [place], [place] [address]  Comprehensive metabolic panel     Status: Abnormal   Collection Time: 08/29/19  7:29 PM  Result Value Ref Range   Sodium 135 135 - 145 mmol/L   Potassium 3.8 3.5 - 5.1 mmol/L   Chloride 108 98 - 111 mmol/L   CO2 16 (L) 22 - 32 mmol/L   Glucose, Bld 87 70 - 99 mg/dL   BUN 8 6 - 20 mg/dL   Creatinine, Ser 8.460.61 0.44 - 1.00 mg/dL   Calcium 9.3 8.9 - 96.210.3 mg/dL   Total Protein 6.6 6.5 - 8.1 g/dL   Albumin 3.6 3.5 - 5.0 g/dL   AST 20 15 - 41 U/L   ALT 25 0 - 44 U/L   Alkaline Phosphatase 106 38 - 126 U/L   Total Bilirubin 0.4 0.3 - 1.2 mg/dL   GFR calc non Af Amer >60 >60 mL/min   GFR calc Af Amer >60 >60 mL/min   Anion gap 11 5 - 15    Comment: Performed at [facility], [address] [place], [place] [address]  Troponin I (High Sensitivity)     Status: None   Collection Time: 08/29/19  7:29 PM  Result Value Ref Range   Troponin I (High Sensitivity) 6 <18 ng/L    Comment: (NOTE) Elevated high sensitivity troponin I (hsTnI) values and significant  changes across serial measurements may suggest ACS but many other  chronic and acute conditions are known to elevate hsTnI results.  Refer to the "Links" section for chest pain algorithms and additional  guidance. Performed at [facility], [address] [place], [place] [address]   Urinalysis, Routine w reflex microscopic     Status: Abnormal   Collection Time: 08/29/19  9:36 PM  Result Value Ref Range   Color, Urine YELLOW YELLOW   APPearance CLEAR CLEAR   Specific Gravity, Urine 1.020 1.005 - 1.030   pH 5.0 5.0 - 8.0   Glucose, UA NEGATIVE  NEGATIVE mg/dL   Hgb urine dipstick NEGATIVE NEGATIVE  Bilirubin Urine NEGATIVE NEGATIVE   Ketones, ur 80 (A) NEGATIVE mg/dL   Protein, ur NEGATIVE NEGATIVE mg/dL   Nitrite NEGATIVE NEGATIVE   Leukocytes,Ua TRACE (A) NEGATIVE   RBC / HPF 0-5 0 - 5 RBC/hpf   WBC, UA 6-10 0 - 5 WBC/hpf   Bacteria, UA RARE (A) NONE SEEN   Squamous Epithelial / LPF 0-5 0 - 5   Mucus PRESENT     Comment: Performed at [facility], [place] [address], [place], [place] [address]  Troponin I (High Sensitivity)     Status: None   Collection Time: 08/29/19  9:49 PM  Result Value Ref Range   Troponin I (High Sensitivity) 8 <18 ng/L    Comment: (NOTE) Elevated high sensitivity troponin I (hsTnI) values and significant  changes across serial measurements may suggest ACS but many other  chronic and acute conditions are known to elevate hsTnI results.  Refer to the "Links" section for chest pain algorithms and additional  guidance. Performed at [facility], [place] [address], [place], [place] [address]   Pregnancy, urine     Status: Abnormal   Collection Time: 08/29/19 10:39 PM  Result Value Ref Range   Preg Test, Ur POSITIVE (A) NEGATIVE    Comment:        THE SENSITIVITY OF THIS METHODOLOGY IS >20 mIU/mL. Performed at [facility], [place] [address], [place],  [address]    Dg Chest Portable 1 View  Result Date: 08/29/2019 CLINICAL DATA:  Acute chest pain today. EXAM: PORTABLE CHEST 1 VIEW COMPARISON:  06/12/2018 FINDINGS: The cardiomediastinal silhouette is unremarkable. There is no evidence of focal airspace disease, pulmonary edema, suspicious pulmonary nodule/mass, pleural effusion, or pneumothorax. No acute bony abnormalities are identified. IMPRESSION: No active disease. Electronically Signed   By: Margarette Canada M.D.   On: 08/29/2019 20:08    Pending Labs Unresulted Labs (From admission, onward)    Start     Ordered   08/30/19 0500  Comprehensive metabolic panel  Tomorrow morning,    R     08/30/19 0008   08/30/19 0500  CBC  Tomorrow morning,   R     08/30/19 0008   08/29/19 2314  SARS Coronavirus 2 [facility] order, Performed in [facility] hospital lab) Nasopharyngeal Nasopharyngeal Swab  (Symptomatic/High Risk of Exposure/Tier 1 Patients Labs with Precautions)  Once,   STAT    Question Answer Comment  Is this test for diagnosis or screening Diagnosis of ill patient   Symptomatic for COVID-19 as defined by CDC Yes   Date of Symptom Onset 08/28/2019   Hospitalized for COVID-19 No   Admitted to ICU for COVID-19 No   Previously tested for COVID-19 No   Resident in a congregate (group) care setting No   Employed in healthcare setting No   Pregnant Yes      08/29/19 2314          Vitals/Pain Today's Vitals   08/29/19 2330 08/29/19 2337 08/29/19 2345 08/29/19 2359  BP: (!) 143/105  140/86   Pulse: 97  94   Resp: 13  18   SpO2: 100%  100%   Weight:      Height:      PainSc:  10-Worst pain ever  6     Isolation Precautions Airborne and Contact precautions  Medications Medications  nitroGLYCERIN (NITROSTAT) SL tablet 0.4 mg (has no administration in time range)  sodium chloride flush (NS) 0.9 % injection 3 mL (  has no administration in time range)  sodium chloride flush (NS) 0.9 % injection 3 mL (has no administration in time range)  0.9 %  sodium chloride infusion (has no administration in time range)  acetaminophen (TYLENOL) tablet 650 mg (has no administration in time range)    Or  acetaminophen (TYLENOL) suppository 650 mg (has no administration in time range)  sodium chloride 0.9 % bolus 1,000 mL (0 mLs Intravenous Stopped 08/29/19 2141)  morphine 2 MG/ML injection 2 mg (2 mg Intravenous Given 08/29/19 2029)  ondansetron (ZOFRAN) injection 4 mg (4 mg Intravenous Given 08/29/19 2027)  sodium chloride 0.9 % bolus 1,000 mL (1,000 mLs Intravenous New Bag/Given 08/29/19 2245)  ondansetron (ZOFRAN) injection 4 mg (4 mg Intravenous Given 08/29/19 2334)  morphine  4 MG/ML injection 4 mg (4 mg Intravenous Given 08/29/19 2336)    Mobility walks Low fall risk   Focused Assessments Cardiac Assessment Handoff:  Cardiac Rhythm: Normal sinus rhythm Lab Results  Component Value Date   TROPONINI <0.03 06/12/2018   Lab Results  Component Value Date   DDIMER <0.27 06/24/2013   Does the Patient currently have chest pain? Yes     R Recommendations: See Admitting Provider Note  Report given to:   Additional Notes:  Trop neg

## 2019-08-30 NOTE — Progress Notes (Signed)
Patient placed in observation after midnight, but seen in ER prior to midnight.  Here with very atypical chest pain- sharp on left side.  CE negative, d dimer negative.  TSH reflective of hyperthyroidism which patient appears to have at least a 3 year h/o per chart review. Unfortunately, she is not compliant with medications nor follow up with Endocrinology.  She stopped her PTU prior to the birth of her son in May.  She is approximately [redacted] weeks pregnant now.  Will contact her OB- Dr. Amalia Hailey to discuss plans going forward.  Will add topical treatment for her chest wall. Eulogio Bear DO

## 2019-08-30 NOTE — H&P (Signed)
TRH H&P    Patient Demographics:    Neldon McYasmine Rowand, is a 33 y.o. female  MRN: [id number]  DOB - 1986/01/19  Admit Date - 08/29/2019  Referring MD/NP/PA: Jeanette CapriceSophia  Outpatient Primary MD for the patient is Patient, No Pcp Per  Patient coming from:  home  Chief complaint- chest pain   HPI:    Neldon McYasmine Manthey  is a 33 y.o. female, w h/o MI, CAD s/p stent x2 in 2017, w most recent stress test about 1 year ago [facility] [place], [place], apparently presents with c/o chest pain "sharp" under the left breast without radiation starting about 12 noon. At rest.  Pt states that pain continued and therefore presented to ED, pt had relief with morphine. Pt denies fever, chills, palp, sob, n/v, abd pain, diarrhea, brbpr.   In ED,  T 97 P 93 R 15, Bp 138/83  Pox 99% on RA WT 84.4kg   Wbc 9.4, Hgb 11.5, Plt 250 N 135, K 3.8, Bun 8, Creatinine 0.61 Ast 20, Alt 25  Trop 6->8  Urine preg +  EKG nsr at 95, nl axis, nl int, no st-t changes c/w ischemia  ED spoke with cardiology fellow and states that they recommended cardiac echo and to consult cardiology in am  Pt will be admitted for chest pain   Review of systems:    In addition to the HPI above,  No Fever-chills, No Headache, No changes with Vision or hearing, No problems swallowing food or Liquids, No Cough or Shortness of Breath, No Abdominal pain, No Nausea or Vomiting, bowel movements are regular, No Blood in stool or Urine, No dysuria, No new skin rashes or bruises, No new joints pains-aches,  No new weakness, tingling, numbness in any extremity, No recent weight gain or loss, No polyuria, polydypsia or polyphagia, No significant Mental Stressors.  All other systems reviewed and are negative.    Past History of the following :    Past Medical History:  Diagnosis Date  . Asthma   . Diabetes mellitus   . Hypertension   .  Hyperthyroidism   . MI (myocardial infarction) (HCC) 2017  . Neuropathy   . Tachycardia   . Thyroid disease       Past Surgical History:  Procedure Laterality Date  . ADENOIDECTOMY    . APPENDECTOMY    . CARDIAC CATHETERIZATION  03[place]   . CESAREAN SECTION    . CESAREAN SECTION N/A 05/05/2019   Procedure: REPEAT CESAREAN SECTION;  Surgeon: Linzie CollinEvans, David Jozef Eisenbeis, MD;  Location: [facility] ORS;  Service: Obstetrics;  Laterality: N/A;  . CORONARY ANGIOPLASTY WITH STENT PLACEMENT  03/2016  . DILATION AND CURETTAGE OF UTERUS    . OTHER SURGICAL HISTORY     sweat gland excision  . TONSILLECTOMY        Social History:      Social History   Tobacco Use  . Smoking status: Current Every Day Smoker    Packs/day: 0.50    Years: 10.00    Pack  years: 5.00  . Smokeless tobacco: Never Used  Substance Use Topics  . Alcohol use: No       Family History :     Family History  Problem Relation Age of Onset  . Hypertension Mother   . Asthma Mother   . Diabetes Mother   . Hyperlipidemia Mother   . Heart disease Mother   . Heart failure Mother        Defib placement  . Hypertension Father   . Kidney disease Father   . Heart disease Father   . Cancer Maternal Aunt   . Cancer Maternal Grandmother        Home Medications:   Prior to Admission medications   Medication Sig Start Date End Date Taking? Authorizing Provider  DICLEGIS 10-10 MG TBEC Take 10 mg by mouth 4 (four) times daily. 08/20/19  Yes Harlin Heys, MD     Allergies:     Allergies  Allergen Reactions  . Darvocet [Propoxyphene N-Acetaminophen] Anaphylaxis    Also makes her stomach hurt  . Peanut-Containing Drug Products Shortness Of Breath and Swelling  . Penicillins Shortness Of Breath and Swelling    Did it involve swelling of the face/tongue/throat, SOB, or low BP? Yes Did it involve sudden or severe rash/hives, skin peeling, or any reaction on the inside of your mouth or nose? No Did you  need to seek medical attention at a hospital or doctor's office? Yes When did it last happen?2007 If all above answers are "NO", may proceed with cephalosporin use.   . Cephalexin Hives and Itching  . Tramadol Nausea And Vomiting  . Toradol [Ketorolac Tromethamine] Rash    Red rash with bumps     Physical Exam:   Vitals  Blood pressure 140/86, pulse 94, resp. rate 18, height 5\' 7"  (1.702 m), weight 84.4 kg, last menstrual period 06/25/2019, SpO2 100 %, unknown if currently breastfeeding.  1.  General: axoxo3  2. Psychiatric: euthymic  3. Neurologic: cn2-12 intact, reflexes 2+ symmetric, diffuse with no clonus, motor 5/5 in all 4ext  4. HEENMT:  Anicteric, pupils 1.53mm symmetric, direct, consensual, near intact Neck: no jvd  5. Respiratory : CTAB  6. Cardiovascular : rrr s1, s2, no m/g/r  7. Gastrointestinal:  Abd: soft, nt, nd, +bs  8. Skin:  Ext: no c/c/e,  No rash   9.Musculoskeletal:  Good ROM     Data Review:    CBC Recent Labs  Lab 08/29/19 1929  WBC 9.4  HGB 11.5*  HCT 34.6*  PLT 250  MCV 78.8*  MCH 26.2  MCHC 33.2  RDW 14.0  LYMPHSABS 3.3  MONOABS 0.6  EOSABS 0.0  BASOSABS 0.0   ------------------------------------------------------------------------------------------------------------------  Results for orders placed or performed during the hospital encounter of 08/29/19 (from the past 48 hour(s))  CBC with Differential     Status: Abnormal   Collection Time: 08/29/19  7:29 PM  Result Value Ref Range   WBC 9.4 4.0 - 10.5 K/uL   RBC 4.39 3.87 - 5.11 MIL/uL   Hemoglobin 11.5 (L) 12.0 - 15.0 g/dL   HCT 34.6 (L) 36.0 - 46.0 %   MCV 78.8 (L) 80.0 - 100.0 fL   MCH 26.2 26.0 - 34.0 pg   MCHC 33.2 30.0 - 36.0 g/dL   RDW 14.0 11.5 - 15.5 %   Platelets 250 150 - 400 K/uL   nRBC 0.0 0.0 - 0.2 %   Neutrophils Relative % 59 %   Neutro Abs 5.5 1.7 -  7.7 K/uL   Lymphocytes Relative 35 %   Lymphs Abs 3.3 0.7 - 4.0 K/uL   Monocytes  Relative 6 %   Monocytes Absolute 0.6 0.1 - 1.0 K/uL   Eosinophils Relative 0 %   Eosinophils Absolute 0.0 0.0 - 0.5 K/uL   Basophils Relative 0 %   Basophils Absolute 0.0 0.0 - 0.1 K/uL   Immature Granulocytes 0 %   Abs Immature Granulocytes 0.02 0.00 - 0.07 K/uL    Comment: Performed at [facility], [address], [place], [place] [address]  Comprehensive metabolic panel     Status: Abnormal   Collection Time: 08/29/19  7:29 PM  Result Value Ref Range   Sodium 135 135 - 145 mmol/L   Potassium 3.8 3.5 - 5.1 mmol/L   Chloride 108 98 - 111 mmol/L   CO2 16 (L) 22 - 32 mmol/L   Glucose, Bld 87 70 - 99 mg/dL   BUN 8 6 - 20 mg/dL   Creatinine, Ser 1.11 0.44 - 1.00 mg/dL   Calcium 9.3 8.9 - 73.5 mg/dL   Total Protein 6.6 6.5 - 8.1 g/dL   Albumin 3.6 3.5 - 5.0 g/dL   AST 20 15 - 41 U/L   ALT 25 0 - 44 U/L   Alkaline Phosphatase 106 38 - 126 U/L   Total Bilirubin 0.4 0.3 - 1.2 mg/dL   GFR calc non Af Amer >60 >60 mL/min   GFR calc Af Amer >60 >60 mL/min   Anion gap 11 5 - 15    Comment: Performed at [facility], [address] [place], [place] [address]  Troponin I (High Sensitivity)     Status: None   Collection Time: 08/29/19  7:29 PM  Result Value Ref Range   Troponin I (High Sensitivity) 6 <18 ng/L    Comment: (NOTE) Elevated high sensitivity troponin I (hsTnI) values and significant  changes across serial measurements may suggest ACS but many other  chronic and acute conditions are known to elevate hsTnI results.  Refer to the "Links" section for chest pain algorithms and additional  guidance. Performed at [facility], [address] [place], [place] [address]   Urinalysis, Routine w reflex microscopic     Status: Abnormal   Collection Time: 08/29/19  9:36 PM  Result Value Ref Range   Color, Urine YELLOW YELLOW   APPearance CLEAR CLEAR   Specific Gravity, Urine 1.020 1.005 - 1.030   pH 5.0 5.0 - 8.0   Glucose, UA NEGATIVE NEGATIVE mg/dL   Hgb  urine dipstick NEGATIVE NEGATIVE   Bilirubin Urine NEGATIVE NEGATIVE   Ketones, ur 80 (A) NEGATIVE mg/dL   Protein, ur NEGATIVE NEGATIVE mg/dL   Nitrite NEGATIVE NEGATIVE   Leukocytes,Ua TRACE (A) NEGATIVE   RBC / HPF 0-5 0 - 5 RBC/hpf   WBC, UA 6-10 0 - 5 WBC/hpf   Bacteria, UA RARE (A) NONE SEEN   Squamous Epithelial / LPF 0-5 0 - 5   Mucus PRESENT     Comment: Performed at [facility], [address] [place], [place] [address]  Troponin I (High Sensitivity)     Status: None   Collection Time: 08/29/19  9:49 PM  Result Value Ref Range   Troponin I (High Sensitivity) 8 <18 ng/L    Comment: (NOTE) Elevated high sensitivity troponin I (hsTnI) values and significant  changes across serial measurements may suggest ACS but many other  chronic and acute conditions are  known to elevate hsTnI results.  Refer to the "Links" section for chest pain algorithms and additional  guidance. Performed at [facility], [address] [place], [place] [address]   Pregnancy, urine     Status: Abnormal   Collection Time: 08/29/19 10:39 PM  Result Value Ref Range   Preg Test, Ur POSITIVE (A) NEGATIVE    Comment:        THE SENSITIVITY OF THIS METHODOLOGY IS >20 mIU/mL. Performed at [facility], [address] [place], [place] [address]     Chemistries  Recent Labs  Lab 08/29/19 1929  NA 135  K 3.8  CL 108  CO2 16*  GLUCOSE 87  BUN 8  CREATININE 0.61  CALCIUM 9.3  AST 20  ALT 25  ALKPHOS 106  BILITOT 0.4   ------------------------------------------------------------------------------------------------------------------  ------------------------------------------------------------------------------------------------------------------ GFR: Estimated Creatinine Clearance: 111.6 mL/min (by C-G formula based on SCr of 0.61 mg/dL). Liver Function Tests: Recent Labs  Lab 08/29/19 1929  AST 20  ALT 25  ALKPHOS 106  BILITOT 0.4  PROT 6.6  ALBUMIN 3.6   No  results for input(s): LIPASE, AMYLASE in the last 168 hours. No results for input(s): AMMONIA in the last 168 hours. Coagulation Profile: No results for input(s): INR, PROTIME in the last 168 hours. Cardiac Enzymes: No results for input(s): CKTOTAL, CKMB, CKMBINDEX, TROPONINI in the last 168 hours. BNP (last 3 results) No results for input(s): PROBNP in the last 8760 hours. HbA1C: No results for input(s): HGBA1C in the last 72 hours. CBG: No results for input(s): GLUCAP in the last 168 hours. Lipid Profile: No results for input(s): CHOL, HDL, LDLCALC, TRIG, CHOLHDL, LDLDIRECT in the last 72 hours. Thyroid Function Tests: No results for input(s): TSH, T4TOTAL, FREET4, T3FREE, THYROIDAB in the last 72 hours. Anemia Panel: No results for input(s): VITAMINB12, FOLATE, FERRITIN, TIBC, IRON, RETICCTPCT in the last 72 hours.  --------------------------------------------------------------------------------------------------------------- Urine analysis:    Component Value Date/Time   COLORURINE YELLOW 08/29/2019 2136   APPEARANCEUR CLEAR 08/29/2019 2136   APPEARANCEUR Turbid (A) 10/30/2018 1634   LABSPEC 1.020 08/29/2019 2136   PHURINE 5.0 08/29/2019 2136   GLUCOSEU NEGATIVE 08/29/2019 2136   HGBUR NEGATIVE 08/29/2019 2136   HGBUR negative 11/17/2009 1527   BILIRUBINUR NEGATIVE 08/29/2019 2136   BILIRUBINUR neg 04/29/2019 1358   BILIRUBINUR Negative 10/30/2018 1634   KETONESUR 80 (A) 08/29/2019 2136   PROTEINUR NEGATIVE 08/29/2019 2136   UROBILINOGEN 0.2 04/29/2019 1358   UROBILINOGEN 1.0 09/24/2015 1210   NITRITE NEGATIVE 08/29/2019 2136   LEUKOCYTESUR TRACE (A) 08/29/2019 2136      Imaging Results:    Dg Chest Portable 1 View  Result Date: 08/29/2019 CLINICAL DATA:  Acute chest pain today. EXAM: PORTABLE CHEST 1 VIEW COMPARISON:  06/12/2018 FINDINGS: The cardiomediastinal silhouette is unremarkable. There is no evidence of focal airspace disease, pulmonary edema, suspicious  pulmonary nodule/mass, pleural effusion, or pneumothorax. No acute bony abnormalities are identified. IMPRESSION: No active disease. Electronically Signed   By: Harmon PierJeffrey  Hu M.D.   On: 08/29/2019 20:08       Assessment & Plan:    Principal Problem:   Chest pain Active Problems:   Diabetes mellitus type 2 with complications (HCC)   Smoker  Chest pain, CAD s/p stent x2 Tele Trop I q2h x2 Check hga1c, lipid Check cardiac echo NPO for now Please consult cardiology in am  Smoker Pt counselled on smoking cessation x 3minutes  Formerly diabetic Check hga1c If elevated, please  start on fsbs ac and qhs, ISS  H/o Asthma Monitor  Pregnancy state Cont Diclegis prn   DVT Prophylaxis-   - SCDs  AM Labs Ordered, also please review Full Orders  Family Communication: Admission, patients condition and plan of care including tests being ordered have been discussed with the patient  who indicate understanding and agree with the plan and Code Status.  Code Status:  FULL CODE,    Admission status: Observation: Based on patients clinical presentation and evaluation of above clinical data, I have made determination that patient meets observation criteria at this time.  Time spent in minutes : 55     Pearson GrippeJames Nyeisha Goodall M.D on 08/30/2019 at 12:34 AM

## 2019-08-31 ENCOUNTER — Ambulatory Visit: Payer: Medicaid Other | Admitting: Obstetrics and Gynecology

## 2019-08-31 DIAGNOSIS — E0590 Thyrotoxicosis, unspecified without thyrotoxic crisis or storm: Secondary | ICD-10-CM

## 2019-08-31 DIAGNOSIS — R079 Chest pain, unspecified: Secondary | ICD-10-CM | POA: Diagnosis not present

## 2019-08-31 MED ORDER — PRENATAL MULTIVITAMIN CH: 1.0000 | ORAL_TABLET | Freq: Every day | ORAL | Status: DC

## 2019-08-31 MED ORDER — PRENATAL MULTIVITAMIN CH: 1.0000 | ORAL_TABLET | Freq: Every day | ORAL | 0 refills | Status: DC

## 2019-08-31 NOTE — TOC Initial Note (Addendum)
Transition of Care [facility]) - Initial/Assessment Note    Patient Details  Name: Michelle Osborn MRN: [id number] Date of Birth: 1986/09/16  [facility] CM/SW Contact:    Zenon Mayo, RN Phone Number: 08/31/2019, 2:12 PM  Clinical Narrative:                 Patient states she lives in [place] with her spouse and family.  She has Medicaid insurance and her copay for her medications are around 3.00.  She states she will need transport at discharge.  Also she has a follow up apt scheduled for the [facility].  NCM tried to schedule her an apt for the endocrinologist but they state they need a referral. NCM informed MD , she will fax referral over to Dr. Dwyane Dee office so that patient can get an apt.  NCM called the office, informed them we have tried to fax referral 6 times now and received fax failure, rep states to try to send referral thru epic, NCM sent a referral thru epic stating patient needs asap apt as she is pregnant and mentioned the labs to look at that Dr. Eliseo Squires printed.    Expected Discharge Plan: Homeless Shelter Barriers to Discharge: No Barriers Identified   Patient Goals and CMS Choice Patient states their goals for this hospitalization and ongoing recovery are:: get better   Choice offered to / list presented to : NA  Expected Discharge Plan and Services Expected Discharge Plan: Homeless Shelter   Discharge Planning Services: CM Consult Post Acute Care Choice: NA Living arrangements for the past 2 months: Homeless Shelter                           HH Arranged: NA          Prior Living Arrangements/Services Living arrangements for the past 2 months: Homeless Shelter Lives with:: Spouse Patient language and need for interpreter reviewed:: Yes Do you feel safe going back to the place where you live?: Yes      Need for Family Participation in Patient Care: Yes (Comment) Care giver support system in place?: Yes (comment)   Criminal  Activity/Legal Involvement Pertinent to Current Situation/Hospitalization: No - Comment as needed  Activities of Daily Living Home Assistive Devices/Equipment: None ADL Screening (condition at time of admission) Patient's cognitive ability adequate to safely complete daily activities?: Yes Is the patient deaf or have difficulty hearing?: No Does the patient have difficulty seeing, even when wearing glasses/contacts?: No Does the patient have difficulty concentrating, remembering, or making decisions?: No Patient able to express need for assistance with ADLs?: Yes Does the patient have difficulty dressing or bathing?: No Independently performs ADLs?: Yes (appropriate for developmental age) Does the patient have difficulty walking or climbing stairs?: No Weakness of Legs: None Weakness of Arms/Hands: None  Permission Sought/Granted                  Emotional Assessment   Attitude/Demeanor/Rapport: Gracious Affect (typically observed): Appropriate Orientation: : Oriented to Self, Oriented to Place, Oriented to  Time, Oriented to Situation Alcohol / Substance Use: Not Applicable Psych Involvement: No (comment)  Admission diagnosis:  Nonspecific chest pain [R07.9] Patient Active Problem List   Diagnosis Date Noted  . Chest pain 08/30/2019  . Chronic hypertension with superimposed preeclampsia 05/01/2019  . Supervision of high risk pregnancy, antepartum 01/16/2019  . Diabetes mellitus complicating pregnancy 06/23/7627  . Atherosclerosis of native coronary artery with  stable angina pectoris (HCC) 01/09/2019  . Tachycardia 01/09/2019  . Mixed hyperlipidemia 01/09/2019  . Hyperthyroidism affecting pregnancy in second trimester 01/07/2019  . Lead exposure 12/06/2018  . Smoker 12/06/2018  . Bipolar 2 disorder (HCC) 12/06/2018  . History of anxiety 12/06/2018  . Mild intermittent asthma without complication 12/06/2018  . Type 2 diabetes mellitus without complication, without  long-term current use of insulin (HCC) 12/06/2018  . High-risk pregnancy in second trimester 12/06/2018  . History of heart attack 12/06/2018  . Graves disease 10/30/2018  . History of cesarean section 10/30/2018  . Nausea/vomiting in pregnancy 10/30/2018  . History of marijuana use 10/30/2018  . HYPERCHOLESTEROLEMIA 12/08/2009  . HIDRADENITIS SUPPURATIVA 12/08/2009  . VITAMIN D DEFICIENCY 11/18/2009  . GERD 11/18/2009  . MENORRHAGIA 11/18/2009  . Diabetes mellitus type 2 with complications (HCC) 11/17/2009  . HYPERTENSION 11/17/2009  . ASTHMA 11/17/2009   PCP:  Patient, No Pcp Per Pharmacy:   [facility] [phone] Ginette Otto, [place] - [address] [place] [address] Phone: [phone] Fax: [phone]     Social Determinants of Health (SDOH) Interventions    Readmission Risk Interventions No flowsheet data found.

## 2019-08-31 NOTE — Progress Notes (Signed)
I have attempted to fax referral 5 times.  Each time a failure.  Have tried 2 different numbers.  Patient not safe for d/c until follow up set with endocrinology Michelle Osborn

## 2019-08-31 NOTE — Patient Instructions (Signed)
WHAT OB PATIENTS CAN EXPECT   Confirmation of pregnancy and ultrasound ordered if medically indicated-[redacted] weeks gestation  New OB (NOB) intake with nurse and New OB (NOB) labs- [redacted] weeks gestation  New OB (NOB) physical examination with provider- 11/[redacted] weeks gestation  Flu vaccine-[redacted] weeks gestation  Anatomy scan-[redacted] weeks gestation  Glucose tolerance test, blood work to test for anemia, T-dap vaccine-[redacted] weeks gestation  Vaginal swabs/cultures-STD/Group B strep-[redacted] weeks gestation  Appointments every 4 weeks until 28 weeks  Every 2 weeks from 28 weeks until 36 weeks  Weekly visits from 36 weeks until delivery  Morning Sickness  Morning sickness is when you feel sick to your stomach (nauseous) during pregnancy. You may feel sick to your stomach and throw up (vomit). You may feel sick in the morning, but you can feel this way at any time of day. Some women feel very sick to their stomach and cannot stop throwing up (hyperemesis gravidarum). Follow these instructions at home: Medicines  Take over-the-counter and prescription medicines only as told by your doctor. Do not take any medicines until you talk with your doctor about them first.  Taking multivitamins before getting pregnant can stop or lessen the harshness of morning sickness. Eating and drinking  Eat dry toast or crackers before getting out of bed.  Eat 5 or 6 small meals a day.  Eat dry and bland foods like rice and baked potatoes.  Do not eat greasy, fatty, or spicy foods.  Have someone cook for you if the smell of food causes you to feel sick or throw up.  If you feel sick to your stomach after taking prenatal vitamins, take them at night or with a snack.  Eat protein when you need a snack. Nuts, yogurt, and cheese are good choices.  Drink fluids throughout the day.  Try ginger ale made with real ginger, ginger tea made from fresh grated ginger, or ginger candies. General instructions  Do not use any products  that have nicotine or tobacco in them, such as cigarettes and e-cigarettes. If you need help quitting, ask your doctor.  Use an air purifier to keep the air in your house free of smells.  Get lots of fresh air.  Try to avoid smells that make you feel sick.  Try: ? Wearing a bracelet that is used for seasickness (acupressure wristband). ? Going to a doctor who puts thin needles into certain body points (acupuncture) to improve how you feel. Contact a doctor if:  You need medicine to feel better.  You feel dizzy or light-headed.  You are losing weight. Get help right away if:  You feel very sick to your stomach and cannot stop throwing up.  You pass out (faint).  You have very bad pain in your belly. Summary  Morning sickness is when you feel sick to your stomach (nauseous) during pregnancy.  You may feel sick in the morning, but you can feel this way at any time of day.  Making some changes to what you eat may help your symptoms go away. This information is not intended to replace advice given to you by your health care provider. Make sure you discuss any questions you have with your health care provider. Document Released: 01/24/2005 Document Revised: 11/29/2017 Document Reviewed: 01/17/2017 Elsevier Patient Education  2020 [employer]. How a Baby Grows During Pregnancy  Pregnancy begins when a female's sperm enters a female's egg (fertilization). Fertilization usually happens in one of the tubes (fallopian tubes) that connect the  ovaries to the womb (uterus). The fertilized egg moves down the fallopian tube to the uterus. Once it reaches the uterus, it implants into the lining of the uterus and begins to grow. For the first 10 weeks, the fertilized egg is called an embryo. After 10 weeks, it is called a fetus. As the fetus continues to grow, it receives oxygen and nutrients through tissue (placenta) that grows to support the developing baby. The placenta is the life support  system for the baby. It provides oxygen and nutrition and removes waste. Learning as much as you can about your pregnancy and how your baby is developing can help you enjoy the experience. It can also make you aware of when there might be a problem and when to ask questions. How long does a typical pregnancy last? A pregnancy usually lasts 280 days, or about 40 weeks. Pregnancy is divided into three periods of growth, also called trimesters:  First trimester: 0-12 weeks.  Second trimester: 13-27 weeks.  Third trimester: 28-40 weeks. The day when your baby is ready to be born (full term) is your estimated date of delivery. How does my baby develop month by month? First month  The fertilized egg attaches to the inside of the uterus.  Some cells will form the placenta. Others will form the fetus.  The arms, legs, brain, spinal cord, lungs, and heart begin to develop.  At the end of the first month, the heart begins to beat. Second month  The bones, inner ear, eyelids, hands, and feet form.  The genitals develop.  By the end of 8 weeks, all major organs are developing. Third month  All of the internal organs are forming.  Teeth develop below the gums.  Bones and muscles begin to grow. The spine can flex.  The skin is transparent.  Fingernails and toenails begin to form.  Arms and legs continue to grow longer, and hands and feet develop.  The fetus is about 3 inches (7.6 cm) long. Fourth month  The placenta is completely formed.  The external sex organs, neck, outer ear, eyebrows, eyelids, and fingernails are formed.  The fetus can hear, swallow, and move its arms and legs.  The kidneys begin to produce urine.  The skin is covered with a white, waxy coating (vernix) and very fine hair (lanugo). Fifth month  The fetus moves around more and can be felt for the first time (quickening).  The fetus starts to sleep and wake up and may begin to suck its finger.  The  nails grow to the end of the fingers.  The organ in the digestive system that makes bile (gallbladder) functions and helps to digest nutrients.  If your baby is a girl, eggs are present in her ovaries. If your baby is a boy, testicles start to move down into his scrotum. Sixth month  The lungs are formed.  The eyes open. The brain continues to develop.  Your baby has fingerprints and toe prints. Your baby's hair grows thicker.  At the end of the second trimester, the fetus is about 9 inches (22.9 cm) long. Seventh month  The fetus kicks and stretches.  The eyes are developed enough to sense changes in light.  The hands can make a grasping motion.  The fetus responds to sound. Eighth month  All organs and body systems are fully developed and functioning.  Bones harden, and taste buds develop. The fetus may hiccup.  Certain areas of the brain are still developing. The  skull remains soft. Ninth month  The fetus gains about  lb (0.23 kg) each week.  The lungs are fully developed.  Patterns of sleep develop.  The fetus's head typically moves into a head-down position (vertex) in the uterus to prepare for birth.  The fetus weighs 6-9 lb (2.72-4.08 kg) and is 19-20 inches (48.26-50.8 cm) long. What can I do to have a healthy pregnancy and help my baby develop? General instructions  Take prenatal vitamins as directed by your health care provider. These include vitamins such as folic acid, iron, calcium, and vitamin D. They are important for healthy development.  Take medicines only as directed by your health care provider. Read labels and ask a pharmacist or your health care provider whether over-the-counter medicines, supplements, and prescription drugs are safe to take during pregnancy.  Keep all follow-up visits as directed by your health care provider. This is important. Follow-up visits include prenatal care and screening tests. How do I know if my baby is developing  well? At each prenatal visit, your health care provider will do several different tests to check on your health and keep track of your baby's development. These include:  Fundal height and position. ? Your health care provider will measure your growing belly from your pubic bone to the top of the uterus using a tape measure. ? Your health care provider will also feel your belly to determine your baby's position.  Heartbeat. ? An ultrasound in the first trimester can confirm pregnancy and show a heartbeat, depending on how far along you are. ? Your health care provider will check your baby's heart rate at every prenatal visit.  Second trimester ultrasound. ? This ultrasound checks your baby's development. It also may show your baby's gender. What should I do if I have concerns about my baby's development? Always talk with your health care provider about any concerns that you may have about your pregnancy and your baby. Summary  A pregnancy usually lasts 280 days, or about 40 weeks. Pregnancy is divided into three periods of growth, also called trimesters.  Your health care provider will monitor your baby's growth and development throughout your pregnancy.  Follow your health care provider's recommendations about taking prenatal vitamins and medicines during your pregnancy.  Talk with your health care provider if you have any concerns about your pregnancy or your developing baby. This information is not intended to replace advice given to you by your health care provider. Make sure you discuss any questions you have with your health care provider. Document Released: 06/04/2008 Document Revised: 04/09/2019 Document Reviewed: 10/30/2017 Elsevier Patient Education  2020 [place] of Pregnancy  The first trimester of pregnancy is from week 1 until the end of week 13 (months 1 through 3). During this time, your baby will begin to develop inside you. At 6-8 weeks, the eyes  and face are formed, and the heartbeat can be seen on ultrasound. At the end of 12 weeks, all the baby's organs are formed. Prenatal care is all the medical care you receive before the birth of your baby. Make sure you get good prenatal care and follow all of your doctor's instructions. Follow these instructions at home: Medicines  Take over-the-counter and prescription medicines only as told by your doctor. Some medicines are safe and some medicines are not safe during pregnancy.  Take a prenatal vitamin that contains at least 600 micrograms (mcg) of folic acid.  If you have trouble pooping (constipation), take medicine that  will make your stool soft (stool softener) if your doctor approves. Eating and drinking   Eat regular, healthy meals.  Your doctor will tell you the amount of weight gain that is right for you.  Avoid raw meat and uncooked cheese.  If you feel sick to your stomach (nauseous) or throw up (vomit): ? Eat 4 or 5 small meals a day instead of 3 large meals. ? Try eating a few soda crackers. ? Drink liquids between meals instead of during meals.  To prevent constipation: ? Eat foods that are high in fiber, like fresh fruits and vegetables, whole grains, and beans. ? Drink enough fluids to keep your pee (urine) clear or pale yellow. Activity  Exercise only as told by your doctor. Stop exercising if you have cramps or pain in your lower belly (abdomen) or low back.  Do not exercise if it is too hot, too humid, or if you are in a place of great height (high altitude).  Try to avoid standing for long periods of time. Move your legs often if you must stand in one place for a long time.  Avoid heavy lifting.  Wear low-heeled shoes. Sit and stand up straight.  You can have sex unless your doctor tells you not to. Relieving pain and discomfort  Wear a good support bra if your breasts are sore.  Take warm water baths (sitz baths) to soothe pain or discomfort caused by  hemorrhoids. Use hemorrhoid cream if your doctor says it is okay.  Rest with your legs raised if you have leg cramps or low back pain.  If you have puffy, bulging veins (varicose veins) in your legs: ? Wear support hose or compression stockings as told by your doctor. ? Raise (elevate) your feet for 15 minutes, 3-4 times a day. ? Limit salt in your food. Prenatal care  Schedule your prenatal visits by the twelfth week of pregnancy.  Write down your questions. Take them to your prenatal visits.  Keep all your prenatal visits as told by your doctor. This is important. Safety  Wear your seat belt at all times when driving.  Make a list of emergency phone numbers. The list should include numbers for family, friends, the hospital, and police and fire departments. General instructions  Ask your doctor for a referral to a local prenatal class. Begin classes no later than at the start of month 6 of your pregnancy.  Ask for help if you need counseling or if you need help with nutrition. Your doctor can give you advice or tell you where to go for help.  Do not use hot tubs, steam rooms, or saunas.  Do not douche or use tampons or scented sanitary pads.  Do not cross your legs for long periods of time.  Avoid all herbs and alcohol. Avoid drugs that are not approved by your doctor.  Do not use any tobacco products, including cigarettes, chewing tobacco, and electronic cigarettes. If you need help quitting, ask your doctor. You may get counseling or other support to help you quit.  Avoid cat litter boxes and soil used by cats. These carry germs that can cause birth defects in the baby and can cause a loss of your baby (miscarriage) or stillbirth.  Visit your dentist. At home, brush your teeth with a soft toothbrush. Be gentle when you floss. Contact a doctor if:  You are dizzy.  You have mild cramps or pressure in your lower belly.  You have a nagging pain  in your belly area.  You  continue to feel sick to your stomach, you throw up, or you have watery poop (diarrhea).  You have a bad smelling fluid coming from your vagina.  You have pain when you pee (urinate).  You have increased puffiness (swelling) in your face, hands, legs, or ankles. Get help right away if:  You have a fever.  You are leaking fluid from your vagina.  You have spotting or bleeding from your vagina.  You have very bad belly cramping or pain.  You gain or lose weight rapidly.  You throw up blood. It may look like coffee grounds.  You are around people who have [place] measles, fifth disease, or chickenpox.  You have a very bad headache.  You have shortness of breath.  You have any kind of trauma, such as from a fall or a car accident. Summary  The first trimester of pregnancy is from week 1 until the end of week 13 (months 1 through 3).  To take care of yourself and your unborn baby, you will need to eat healthy meals, take medicines only if your doctor tells you to do so, and do activities that are safe for you and your baby.  Keep all follow-up visits as told by your doctor. This is important as your doctor will have to ensure that your baby is healthy and growing well. This information is not intended to replace advice given to you by your health care provider. Make sure you discuss any questions you have with your health care provider. Document Released: 06/04/2008 Document Revised: 04/09/2019 Document Reviewed: 12/25/2016 Elsevier Patient Education  2020 [employer]. Commonly Asked Questions During Pregnancy  Cats: A parasite can be excreted in cat feces.  To avoid exposure you need to have another person empty the little box.  If you must empty the litter box you will need to wear gloves.  Wash your hands after handling your cat.  This parasite can also be found in raw or undercooked meat so this should also be avoided.  Colds, Sore Throats, Flu: Please check your medication  sheet to see what you can take for symptoms.  If your symptoms are unrelieved by these medications please call the office.  Dental Work: Most any dental work Investment banker, corporate recommends is permitted.  X-rays should only be taken during the first trimester if absolutely necessary.  Your abdomen should be shielded with a lead apron during all x-rays.  Please notify your provider prior to receiving any x-rays.  Novocaine is fine; gas is not recommended.  If your dentist requires a note from [place] prior to dental work please call the office and we will provide one for you.  Exercise: Exercise is an important part of staying healthy during your pregnancy.  You may continue most exercises you were accustomed to prior to pregnancy.  Later in your pregnancy you will most likely notice you have difficulty with activities requiring balance like riding a bicycle.  It is important that you listen to your body and avoid activities that put you at a higher risk of falling.  Adequate rest and staying well hydrated are a must!  If you have questions about the safety of specific activities ask your provider.    Exposure to Children with illness: Try to avoid obvious exposure; report any symptoms to [place] when noted,  If you have chicken pos, red measles or mumps, you should be immune to these diseases.   Please do not  take any vaccines while pregnant unless you have checked with your OB provider.  Fetal Movement: After 28 weeks we recommend you do "kick counts" twice daily.  Lie or sit down in a calm quiet environment and count your baby movements "kicks".  You should feel your baby at least 10 times per hour.  If you have not felt 10 kicks within the first hour get up, walk around and have something sweet to eat or drink then repeat for an additional hour.  If count remains less than 10 per hour notify your provider.  Fumigating: Follow your pest control agent's advice as to how long to stay out of your home.  Ventilate the area  well before re-entering.  Hemorrhoids:   Most over-the-counter preparations can be used during pregnancy.  Check your medication to see what is safe to use.  It is important to use a stool softener or fiber in your diet and to drink lots of liquids.  If hemorrhoids seem to be getting worse please call the office.   Hot Tubs:  Hot tubs Jacuzzis and saunas are not recommended while pregnant.  These increase your internal body temperature and should be avoided.  Intercourse:  Sexual intercourse is safe during pregnancy as long as you are comfortable, unless otherwise advised by your provider.  Spotting may occur after intercourse; report any bright red bleeding that is heavier than spotting.  Labor:  If you know that you are in labor, please go to the hospital.  If you are unsure, please call the office and let us help you decide what to do.  Lifting, straining, etc:  If your job requires heavy lifting or straining please check with your provider for any limitations.  Generally, you should not lift items heavier than that you can lift simply with your hands and arms (no back muscles)  Painting:  Paint fumes do not harm your pregnancy, but may make you ill and should be avoided if possible.  Latex or water based paints have less odor than oils.  Use adequate ventilation while painting.  Permanents & Hair Color:  Chemicals in hair dyes are not recommended as they cause increase hair dryness which can increase hair loss during pregnancy.  " Highlighting" and permanents are allowed.  Dye may be absorbed differently and permanents may not hold as well during pregnancy.  Sunbathing:  Use a sunscreen, as skin burns easily during pregnancy.  Drink plenty of fluids; avoid over heating.  Tanning Beds:  Because their possible side effects are still unknown, tanning beds are not recommended.  Ultrasound Scans:  Routine ultrasounds are performed at approximately 20 weeks.  You will be able to see your baby's  general anatomy an if you would like to know the gender this can usually be determined as well.  If it is questionable when you conceived you may also receive an ultrasound early in your pregnancy for dating purposes.  Otherwise ultrasound exams are not routinely performed unless there is a medical necessity.  Although you can request a scan we ask that you pay for it when conducted because insurance does not cover " patient request" scans.  Work: If your pregnancy proceeds without complications you may work until your due date, unless your physician or employer advises otherwise.  Round Ligament Pain/Pelvic Discomfort:  Sharp, shooting pains not associated with bleeding are fairly common, usually occurring in the second trimester of pregnancy.  They tend to be worse when standing up or when you remain   standing for long periods of time.  These are the result of pressure of certain pelvic ligaments called "round ligaments".  Rest, Tylenol and heat seem to be the most effective relief.  As the womb and fetus grow, they rise out of the pelvis and the discomfort improves.  Please notify the office if your pain seems different than that described.  It may represent a more serious condition.  Common Medications Safe in Pregnancy  Acne:      Constipation:  Benzoyl Peroxide     Colace  Clindamycin      Dulcolax Suppository  Topica Erythromycin     Fibercon  Salicylic Acid      Metamucil         Miralax AVOID:        Senakot   Accutane    Cough:  Retin-A       Cough Drops  Tetracycline      Phenergan w/ Codeine if Rx  Minocycline      Robitussin (Plain & DM)  Antibiotics:     Crabs/Lice:  Ceclor       RID  Cephalosporins    AVOID:  E-Mycins      Kwell  Keflex  Macrobid/Macrodantin   Diarrhea:  Penicillin      Kao-Pectate  Zithromax      Imodium AD         PUSH FLUIDS AVOID:       Cipro     Fever:  Tetracycline      Tylenol (Regular or Extra  Minocycline       Strength)  Levaquin      Extra  Strength-Do not          Exceed 8 tabs/24 hrs Caffeine:        <267m/day (equiv. To 1 cup of coffee or  approx. 3 12 oz sodas)         Gas: Cold/Hayfever:       Gas-X  Benadryl      Mylicon  Claritin       Phazyme  **Claritin-D        Chlor-Trimeton    Headaches:  Dimetapp      ASA-Free Excedrin  Drixoral-Non-Drowsy     Cold Compress  Mucinex (Guaifenasin)     Tylenol (Regular or Extra  Sudafed/Sudafed-12 Hour     Strength)  **Sudafed PE Pseudoephedrine   Tylenol Cold & Sinus     Vicks Vapor Rub  Zyrtec  **AVOID if Problems With Blood Pressure         Heartburn: Avoid lying down for at least 1 hour after meals  Aciphex      Maalox     Rash:  Milk of Magnesia     Benadryl    Mylanta       1% Hydrocortisone Cream  Pepcid  Pepcid Complete   Sleep Aids:  Prevacid      Ambien   Prilosec       Benadryl  Rolaids       Chamomile Tea  Tums (Limit 4/day)     Unisom  Zantac       Tylenol PM         Warm milk-add vanilla or  Hemorrhoids:       Sugar for taste  Anusol/Anusol H.C.  (RX: Analapram 2.5%)  Sugar Substitutes:  Hydrocortisone OTC     Ok in moderation  Preparation H      Tucks        Vaseline lotion  applied to tissue with wiping    Herpes:     Throat:  Acyclovir      Oragel  Famvir  Valtrex     Vaccines:         Flu Shot Leg Cramps:       *Gardasil  Benadryl      Hepatitis A         Hepatitis B Nasal Spray:       Pneumovax  Saline Nasal Spray     Polio Booster         Tetanus Nausea:       Tuberculosis test or PPD  Vitamin B6 25 mg TID   AVOID:    Dramamine      *Gardasil  Emetrol       Live Poliovirus  Ginger Root 250 mg QID    MMR (measles, mumps &  High Complex Carbs @ Bedtime    rebella)  Sea Bands-Accupressure    Varicella (Chickenpox)  Unisom 1/2 tab TID     *No known complications           If received before Pain:         Known pregnancy;   Darvocet       Resume series after  Lortab        Delivery  Percocet    Yeast:   Tramadol       Femstat  Tylenol 3      Gyne-lotrimin  Ultram       Monistat  Vicodin           MISC:         All Sunscreens           Hair Coloring/highlights          Insect Repellant's          (Including DEET)         Mystic Tans  

## 2019-08-31 NOTE — Discharge Summary (Signed)
Physician Discharge Summary  Michelle Osborn ZOX:[id number] DOB: 10/13/1986 DOA: 08/29/2019  PCP: Patient, No Pcp Per  Admit date: 08/29/2019 Discharge date: 08/31/2019  Admitted From: home Discharge disposition: home   Recommendations for Outpatient Follow-Up:   Have faxed to Dr. Remus BlakeKumar's office a request for an ASAP follow up-- have emphasized to the patient how important it is to go (have held on starting PTU in case they need more labs) -she has medicaid transportation   Discharge Diagnosis:   Principal Problem:   Chest pain Active Problems:   Diabetes mellitus type 2 with complications (HCC)   Smoker    Discharge Condition: Improved.  Diet recommendation: Low sodium, heart healthy.  Wound care: None.  Code status: Full.   History of Present Illness:    Michelle Osborn  is a 33 y.o. female, w h/o MI, CAD s/p stent x2 in 2017, w most recent stress test about 1 year ago [facility] [place], [place], apparently presents with c/o chest pain "sharp" under the left breast without radiation starting about 12 noon. At rest.  Pt states that pain continued and therefore presented to ED, pt had relief with morphine. Pt denies fever, chills, palp, sob, n/v, abd pain, diarrhea, brbpr.     Hospital Course by Problem:   Here with very atypical chest pain- sharp on left side. -  CE negative, d dimer negative.    Pregnancy -spoke with Dr. Logan BoresEvans -has follow up arranged  Hyperthyroidism TSH/free t4 reflective of hyperthyroidism (chart review shows at least 3 years of this) 2017[facility]-- start on methimazole-- got pregnant and this was stopped, started PTU -attmpted nuclear scan but never done 2019[facility]- start methimazole-- became pregnant again May 2020- took herself off PTU (doubt she was taking on a regular basis if ever)  -urgent endocrinology referral  Medical Consultants:      Discharge Exam:   Vitals:   08/31/19 0806 08/31/19 1321  BP:  132/76 132/88  Pulse: 82 94  Resp: 16 17  Temp: 98.7 F (37.1 C) 98.5 F (36.9 C)  SpO2: 98% 95%   Vitals:   08/30/19 2008 08/31/19 0551 08/31/19 0806 08/31/19 1321  BP: 124/75 124/74 132/76 132/88  Pulse: 85 73 82 94  Resp: 18 20 16 17   Temp: 98.9 F (37.2 C) 98.6 F (37 C) 98.7 F (37.1 C) 98.5 F (36.9 C)  TempSrc: Oral Oral Oral Oral  SpO2: 100% 99% 98% 95%  Weight:  82.1 kg    Height:        General exam: Appears calm and comfortable.   The results of significant diagnostics from this hospitalization (including imaging, microbiology, ancillary and laboratory) are listed below for reference.     Procedures and Diagnostic Studies:   Dg Chest Portable 1 View  Result Date: 08/29/2019 CLINICAL DATA:  Acute chest pain today. EXAM: PORTABLE CHEST 1 VIEW COMPARISON:  06/12/2018 FINDINGS: The cardiomediastinal silhouette is unremarkable. There is no evidence of focal airspace disease, pulmonary edema, suspicious pulmonary nodule/mass, pleural effusion, or pneumothorax. No acute bony abnormalities are identified. IMPRESSION: No active disease. Electronically Signed   By: Harmon PierJeffrey  Hu M.D.   On: 08/29/2019 20:08     Labs:   Basic Metabolic Panel: Recent Labs  Lab 08/29/19 1929 08/30/19 0457  NA 135 136  K 3.8 3.8  CL 108 110  CO2 16* 20*  GLUCOSE 87 96  BUN 8 5*  CREATININE 0.61 0.42*  CALCIUM 9.3 8.7*  GFR Estimated Creatinine Clearance: 110.2 mL/min (A) (by C-G formula based on SCr of 0.42 mg/dL (L)). Liver Function Tests: Recent Labs  Lab 08/29/19 1929 08/30/19 0457  AST 20 19  ALT 25 23  ALKPHOS 106 84  BILITOT 0.4 0.6  PROT 6.6 5.8*  ALBUMIN 3.6 3.1*   No results for input(s): LIPASE, AMYLASE in the last 168 hours. No results for input(s): AMMONIA in the last 168 hours. Coagulation profile No results for input(s): INR, PROTIME in the last 168 hours.  CBC: Recent Labs  Lab 08/29/19 1929 08/30/19 0457  WBC 9.4 7.4  NEUTROABS 5.5  --   HGB  11.5* 10.3*  HCT 34.6* 31.2*  MCV 78.8* 79.0*  PLT 250 224   Cardiac Enzymes: No results for input(s): CKTOTAL, CKMB, CKMBINDEX, TROPONINI in the last 168 hours. BNP: Invalid input(s): POCBNP CBG: Recent Labs  Lab 08/30/19 0607  GLUCAP 81   D-Dimer Recent Labs    08/30/19 0801  DDIMER 0.41   Hgb A1c Recent Labs    08/30/19 0457  HGBA1C 4.8   Lipid Profile No results for input(s): CHOL, HDL, LDLCALC, TRIG, CHOLHDL, LDLDIRECT in the last 72 hours. Thyroid function studies Recent Labs    08/30/19 1041  TSH <0.010*   Anemia work up No results for input(s): VITAMINB12, FOLATE, FERRITIN, TIBC, IRON, RETICCTPCT in the last 72 hours. Microbiology Recent Results (from the past 240 hour(s))  SARS Coronavirus 2 [facility] order, Performed in [facility] hospital lab) Nasopharyngeal Nasopharyngeal Swab     Status: None   Collection Time: 08/29/19 11:40 PM   Specimen: Nasopharyngeal Swab  Result Value Ref Range Status   SARS Coronavirus 2 NEGATIVE NEGATIVE Final    Comment: (NOTE) If result is NEGATIVE SARS-CoV-2 target nucleic acids are NOT DETECTED. The SARS-CoV-2 RNA is generally detectable in upper and lower  respiratory specimens during the acute phase of infection. The lowest  concentration of SARS-CoV-2 viral copies this assay can detect is 250  copies / mL. A negative result does not preclude SARS-CoV-2 infection  and should not be used as the sole basis for treatment or other  patient management decisions.  A negative result may occur with  improper specimen collection / handling, submission of specimen other  than nasopharyngeal swab, presence of viral mutation(s) within the  areas targeted by this assay, and inadequate number of viral copies  (<250 copies / mL). A negative result must be combined with clinical  observations, patient history, and epidemiological information. If result is POSITIVE SARS-CoV-2 target nucleic acids are DETECTED. The SARS-CoV-2 RNA  is generally detectable in upper and lower  respiratory specimens dur ing the acute phase of infection.  Positive  results are indicative of active infection with SARS-CoV-2.  Clinical  correlation with patient history and other diagnostic information is  necessary to determine patient infection status.  Positive results do  not rule out bacterial infection or co-infection with other viruses. If result is PRESUMPTIVE POSTIVE SARS-CoV-2 nucleic acids MAY BE PRESENT.   A presumptive positive result was obtained on the submitted specimen  and confirmed on repeat testing.  While 2019 novel coronavirus  (SARS-CoV-2) nucleic acids may be present in the submitted sample  additional confirmatory testing may be necessary for epidemiological  and / or clinical management purposes  to differentiate between  SARS-CoV-2 and other Sarbecovirus currently known to infect humans.  If clinically indicated additional testing with an alternate test  methodology [phone]) is advised. The SARS-CoV-2 RNA is generally  detectable in upper and lower respiratory sp ecimens during the acute  phase of infection. The expected result is Negative. Fact Sheet for Patients:  StrictlyIdeas.no Fact Sheet for Healthcare Providers: BankingDealers.co.za This test is not yet approved or cleared by the [place] FDA and has been authorized for detection and/or diagnosis of SARS-CoV-2 by FDA under an Emergency Use Authorization (EUA).  This EUA will remain in effect (meaning this test can be used) for the duration of the COVID-19 declaration under Section 564(b)(1) of the Act, 21 U.S.C. section 360bbb-3(b)(1), unless the authorization is terminated or revoked sooner. Performed at [facility], [place] [address] [place], [place] [address]      Discharge Instructions:   Discharge Instructions    Diet - low sodium heart healthy   Complete by: As directed     Discharge instructions   Complete by: As directed    It is EXTREMELY important to follow up with endocrine for your thyroid-- they will be contacting you with an ASAP appointment.   Increase activity slowly   Complete by: As directed      Allergies as of 08/31/2019      Reactions   Darvocet [propoxyphene N-acetaminophen] Anaphylaxis   Also makes her stomach hurt   Peanut-containing Drug Products Shortness Of Breath, Swelling   Penicillins Shortness Of Breath, Swelling   Did it involve swelling of the face/tongue/throat, SOB, or low BP? Yes Did it involve sudden or severe rash/hives, skin peeling, or any reaction on the inside of your mouth or nose? No Did you need to seek medical attention at a hospital or doctor's office? Yes When did it last happen?2007 If all above answers are "NO", may proceed with cephalosporin use.   Cephalexin Hives, Itching   Tramadol Nausea And Vomiting   Toradol [ketorolac Tromethamine] Rash   Red rash with bumps      Medication List    TAKE these medications   Diclegis 10-10 MG Tbec Generic drug: Doxylamine-Pyridoxine Take 10 mg by mouth 4 (four) times daily.   prenatal multivitamin Tabs tablet Take 1 tablet by mouth daily at 12 noon. Start taking on: September 01, 2019      Follow-up [place]. Go on 09/17/2019.   Why: @2 :30pm Contact information: [address] [place]  [address] [phone]           Time coordinating discharge: 25 min  Signed:  Geradine Girt DO  [facility] 08/31/2019, 4:50 PM

## 2019-09-04 ENCOUNTER — Telehealth: Payer: Self-pay

## 2019-09-04 NOTE — Telephone Encounter (Signed)
Pt states her diclegis is not working.  She is also having blood in her stool.   Pls advise.

## 2019-09-04 NOTE — Telephone Encounter (Signed)
Can you check with Dr. Marcelline Mates on this since Amalia Hailey is not here.

## 2019-09-08 ENCOUNTER — Other Ambulatory Visit: Payer: Medicaid Other

## 2019-09-08 MED ORDER — PROMETHAZINE HCL 25 MG PO TABS: 25.0000 mg | ORAL_TABLET | Freq: Four times a day (QID) | ORAL | 1 refills | Status: DC | PRN

## 2019-09-08 NOTE — Telephone Encounter (Signed)
Please advise 

## 2019-09-08 NOTE — Telephone Encounter (Signed)
Spoke to pt about her appointments and about her call to the office on Friday. Pt stated that the diclegis is not working. Pt stated that she is having bleeding from her rectum area like she is having a cycle. Pt stated that she is not having any hard stool, constipation, or hemorrhoid. Phenergan was sent to the pt's pharamcy to help with n/v.

## 2019-09-08 NOTE — Telephone Encounter (Signed)
Will you please ask Michelle Osborn about her rectum bleeding.

## 2019-09-09 ENCOUNTER — Other Ambulatory Visit: Payer: Self-pay

## 2019-09-09 ENCOUNTER — Ambulatory Visit (INDEPENDENT_AMBULATORY_CARE_PROVIDER_SITE_OTHER): Payer: Medicaid Other | Admitting: Internal Medicine

## 2019-09-09 ENCOUNTER — Encounter: Payer: Self-pay | Admitting: Internal Medicine

## 2019-09-09 VITALS — BP 122/88 | HR 94 | Temp 98.2°F | Ht 67.0 in | Wt 182.4 lb

## 2019-09-09 DIAGNOSIS — E0590 Thyrotoxicosis, unspecified without thyrotoxic crisis or storm: Secondary | ICD-10-CM | POA: Diagnosis not present

## 2019-09-09 DIAGNOSIS — R7989 Other specified abnormal findings of blood chemistry: Secondary | ICD-10-CM | POA: Insufficient documentation

## 2019-09-09 LAB — T4, FREE: Free T4: 2 ng/dL — ABNORMAL HIGH (ref 0.60–1.60)

## 2019-09-09 LAB — TSH: TSH: <0.01 u[IU]/mL — ABNORMAL LOW (ref 0.35–4.50)

## 2019-09-09 NOTE — Progress Notes (Signed)
Name: Michelle Osborn  MRN/ DOB: [id number], 1986-10-14    Age/ Sex: 33 y.o., female    PCP: Patient, No Pcp Per   Reason for Endocrinology Evaluation: hyperthyroidism     Date of Initial Endocrinology Evaluation: 09/10/2019     HPI: [name] is a 33 y.o. female with a past medical history of hyperthyroidism since 2017. The patient presented for initial endocrinology clinic visit on 09/10/2019 for consultative assistance with her Low TSH     Pt was diagnosed with hyperthyroidism Secondary to graves' disease (per pt) in 2017 when she was pregnant with her first child, (was living in [place]) she was put on PTU for approximately 6 months during that pregnancy and the pt was lost to follow up after her delivery in 10/2017.  She became pregnant again in 2019 with her second child, went on PTU again for most of that pregnancy , pt again was lost to follow up after her delivery in 05/2019.   Pt is currently pregnant at 10.6 weeks, LMP 06/25/2019.   She presented to the ED 08/2019 with chest pains , during evaluation she was noted to have a suppressed  TSH <0.01 uIU/mL and elevated FT4 2.47 ng/dL    [facility]     Today is c/o weight loss, has occasional diarrhea,and  tremors.    Denies diarrhea , anxiety or jittery sensation  She has chronic enlarged neck with occasional dysphagia   Mother, aunt with thyroid disease  No biotin intake.      HISTORY:  Past Medical History:  Past Medical History:  Diagnosis Date  . Asthma   . Diabetes mellitus   . Hypertension   . Hyperthyroidism   . MI (myocardial infarction) (HCC) 2017  . Neuropathy   . Tachycardia   . Thyroid disease    Past Surgical History:  Past Surgical History:  Procedure Laterality Date  . ADENOIDECTOMY    . APPENDECTOMY    . CARDIAC [place]HE[place]IZATION  03[place]   . CESAREAN SECTION    . CESAREAN SECTION N/A 05/05/2019   Procedure: REPEAT CESAREAN SECTION;  Surgeon:  Linzie CollinEvans, David James, MD;  Location: [facility] ORS;  Service: Obstetrics;  Laterality: N/A;  . CORONARY ANGIOPLASTY WITH STENT PLACEMENT  03/2016  . DILATION AND CURETTAGE OF UTERUS    . OTHER SURGICAL HISTORY     sweat gland excision  . TONSILLECTOMY        Social History:  reports that she has been smoking. She has a 5.00 pack-year smoking history. She has never used smokeless tobacco. She reports current drug use. Drug: Marijuana. She reports that she does not drink alcohol.  Family History: family history includes Asthma in her mother; Cancer in her maternal aunt and maternal grandmother; Diabetes in her mother; Heart disease in her father and mother; Heart failure in her mother; Hyperlipidemia in her mother; Hypertension in her father and mother; Kidney disease in her father.   HOME MEDICATIONS: Allergies as of 09/09/2019      Reactions   Darvocet [propoxyphene N-acetaminophen] Anaphylaxis   Also makes her stomach hurt   Peanut-containing Drug Products Shortness Of Breath, Swelling   Penicillins Shortness Of Breath, Swelling   Did it involve swelling of the face/tongue/throat, SOB, or low BP? Yes Did it involve sudden or severe rash/hives, skin peeling, or any reaction on the inside of your mouth or nose? No Did you need to seek medical attention at a hospital  or doctor's office? Yes When did it last happen?2007 If all above answers are "NO", may proceed with cephalosporin use.   Cephalexin Hives, Itching   Tramadol Nausea And Vomiting   Toradol [ketorolac Tromethamine] Rash   Red rash with bumps      Medication List       Accurate as of September 09, 2019 11:59 PM. If you have any questions, ask your nurse or doctor.        Diclegis 10-10 MG Tbec Generic drug: Doxylamine-Pyridoxine Take 10 mg by mouth 4 (four) times daily.   prenatal multivitamin Tabs tablet Take 1 tablet by mouth daily at 12 noon.   promethazine 25 MG tablet Commonly known as: PHENERGAN Take 1  tablet (25 mg total) by mouth every 6 (six) hours as needed for nausea or vomiting.         REVIEW OF SYSTEMS: A comprehensive ROS was conducted with the patient and is negative except as per HPI and below:  Review of Systems  Cardiovascular: Negative for chest pain.  Gastrointestinal: Positive for nausea and vomiting.  Neurological: Negative for tingling and tremors.  Psychiatric/Behavioral: Negative for depression. The patient is not nervous/anxious.        OBJECTIVE:  VS: BP 122/88 (BP Location: Right Arm, Patient Position: Sitting, Cuff Size: Normal)   Pulse 94   Temp 98.2 F (36.8 C)   Ht 5\' 7"  (1.702 m)   Wt 182 lb 6.4 oz (82.7 kg)   LMP 06/25/2019   SpO2 99%   BMI 28.57 kg/m    Wt Readings from Last 3 Encounters:  09/09/19 182 lb 6.4 oz (82.7 kg)  08/31/19 180 lb 14.4 oz (82.1 kg)  08/04/19 186 lb 9.6 oz (84.6 kg)     EXAM: General: Pt appears well and is in NAD  Hydration: Well-hydrated with moist mucous membranes and good skin turgor  Eyes: External eye exam normal without stare, lid lag or exophthalmos.  EOM intact.    Ears, Nose, Throat: Hearing: Grossly intact bilaterally Dental: Good dentition  Throat: Clear without mass, erythema or exudate  Neck: General: Supple without adenopathy. Thyroid: Thyroid size enlarged .  No nodules appreciated. No thyroid bruit.  Lungs: Clear with good BS bilat with no rales, rhonchi, or wheezes  Heart: Auscultation: RRR.  Abdomen: Normoactive bowel sounds, soft, nontender, without masses or organomegaly palpable  Extremities:  BL LE: No pretibial edema normal ROM and strength.  Skin: Hair: Texture and amount normal with gender appropriate distribution Skin Inspection: No rashes Skin Palpation: Skin temperature, texture, and thickness normal to palpation  Neuro: Cranial nerves: II - XII grossly intact  Motor: Normal strength throughout DTRs: 2+ and symmetric in UE without delay in relaxation phase  Mental Status:  Judgment, insight: Intact Orientation: Oriented to time, place, and person Mood and affect: No depression, anxiety, or agitation     DATA REVIEWED: Results for Michelle, Osborn (MRN [id number]) as of 09/10/2019 13:15  Ref. Range 08/30/2019 10:41 09/09/2019 14:12  TSH Latest Ref Range: 0.35 - 4.50 uIU/mL <0.010 (L) <0.01 (L)  T4,Free(Direct) Latest Ref Range: 0.60 - 1.60 ng/dL 2.47 (H) 2.00 (H)  Thyroxine (T4) Latest Ref Range: 5.1 - 11.9 mcg/dL  15.4 (H)    Results for ROKHAYA, QUINN (MRN [id number]) as of 09/09/2019 13:30  Ref. Range 08/29/2019 19:29  WBC Latest Ref Range: 4.0 - 10.5 K/uL 9.4  RBC Latest Ref Range: 3.87 - 5.11 MIL/uL 4.39  Hemoglobin Latest Ref Range: 12.0 - 15.0 g/dL 11.5 (L)  HCT Latest Ref Range: 36.0 - 46.0 % 34.6 (L)  MCV Latest Ref Range: 80.0 - 100.0 fL 78.8 (L)  MCH Latest Ref Range: 26.0 - 34.0 pg 26.2  MCHC Latest Ref Range: 30.0 - 36.0 g/dL 04.533.2  RDW Latest Ref Range: 11.5 - 15.5 % 14.0  Platelets Latest Ref Range: 150 - 400 K/uL 250  nRBC Latest Ref Range: 0.0 - 0.2 % 0.0  Neutrophils Latest Units: % 59  Lymphocytes Latest Units: % 35  Monocytes Relative Latest Units: % 6  Eosinophil Latest Units: % 0  Basophil Latest Units: % 0  Immature Granulocytes Latest Units: % 0  NEUT# Latest Ref Range: 1.7 - 7.7 K/uL 5.5  Lymphocyte # Latest Ref Range: 0.7 - 4.0 K/uL 3.3  Monocyte # Latest Ref Range: 0.1 - 1.0 K/uL 0.6  Eosinophils Absolute Latest Ref Range: 0.0 - 0.5 K/uL 0.0  Basophils Absolute Latest Ref Range: 0.0 - 0.1 K/uL 0.0  Abs Immature Granulocytes Latest Ref Range: 0.00 - 0.07 K/uL 0.02   Results for Michelle Osborn, Michelle Osborn (MRN [id number]) as of 09/09/2019 13:30  Ref. Range 08/30/2019 04:57  Sodium Latest Ref Range: 135 - 145 mmol/L 136  Potassium Latest Ref Range: 3.5 - 5.1 mmol/L 3.8  Chloride Latest Ref Range: 98 - 111 mmol/L 110  CO2 Latest Ref Range: 22 - 32 mmol/L 20 (L)  Glucose Latest Ref Range: 70 - 99 mg/dL 96  Mean Plasma Glucose Latest Units: mg/dL  78.2991.06  BUN Latest Ref Range: 6 - 20 mg/dL 5 (L)  Creatinine Latest Ref Range: 0.44 - 1.00 mg/dL 5.620.42 (L)  Calcium Latest Ref Range: 8.9 - 10.3 mg/dL 8.7 (L)  Anion gap Latest Ref Range: 5 - 15  6  Alkaline Phosphatase Latest Ref Range: 38 - 126 U/L 84  Albumin Latest Ref Range: 3.5 - 5.0 g/dL 3.1 (L)  AST Latest Ref Range: 15 - 41 U/L 19  ALT Latest Ref Range: 0 - 44 U/L 23  Total Protein Latest Ref Range: 6.5 - 8.1 g/dL 5.8 (L)  Total Bilirubin Latest Ref Range: 0.3 - 1.2 mg/dL 0.6  GFR, Est Non African American Latest Ref Range: >60 mL/min >60  GFR, Est African American Latest Ref Range: >60 mL/min >60   ASSESSMENT/PLAN/RECOMMENDATIONS:   1. Hyperthyroidism:   - Per pt she has been diagnosed with graves' disease since 2017 during her first pregnancy, pt only seeks medical care during pregnancies.  - We did discussed the dangers of uncontrolled hyperthyroidism in causing cardiac arrhythmia in addition to fatigue.  - We also discussed permissive hyperthyroidism during pregnancy, we discussed PTU as the preferred thionamide therapy if needed during the first trimester, and methimazole for the 2nd and 3rd pregnancy.  - We also discussed the higher risk of hepatic failure with PTU.  - Discussed the importance of regular checkups and blood work especially during pregnancy.  - Given her lab results today, with decrease in FT4 and Total T4 not at goal for treatment yet, will hold off on initiating thionamide therapy at this time     Labs in 4 weeks  F/U in 2 months   Signed electronically by: Lyndle HerrlichAbby Jaralla Kenly Henckel, MD  [facility]  [facility] [address] [place], [place] [address] Phone: [phone] FAX: [phone]   CC: Patient, No Pcp Per No address on file Phone: None Fax: None   Return to Endocrinology clinic as below: Future Appointments  Date Time Provider Department Center  09/17/2019  2:30  PM Grayce Sessions, NP  [facility] None  09/22/2019 10:00 AM [facility] [place] 1 [facility]  09/29/2019 10:30 AM Linzie Collin, MD [facility] None  10/07/2019  2:00 PM [facility] None  11/04/2019  1:40 PM Alias Villagran, Konrad Dolores, MD [facility] None

## 2019-09-10 ENCOUNTER — Encounter: Payer: Self-pay | Admitting: Internal Medicine

## 2019-09-12 LAB — T4: T4, Total: 15.4 ug/dL — ABNORMAL HIGH (ref 5.1–11.9)

## 2019-09-12 LAB — TRAB (TSH RECEPTOR BINDING ANTIBODY): TRAB: 4.17 IU/L — ABNORMAL HIGH (ref <=2.00)

## 2019-09-14 ENCOUNTER — Encounter (HOSPITAL_COMMUNITY): Payer: Self-pay | Admitting: *Deleted

## 2019-09-14 ENCOUNTER — Inpatient Hospital Stay (HOSPITAL_COMMUNITY): Payer: Medicaid Other

## 2019-09-14 ENCOUNTER — Other Ambulatory Visit: Payer: Self-pay

## 2019-09-14 ENCOUNTER — Inpatient Hospital Stay (HOSPITAL_COMMUNITY)
Admission: EM | Admit: 2019-09-14 | Discharge: 2019-09-14 | Disposition: A | Discharge status: Home / Self Care | Payer: Medicaid Other | Attending: Obstetrics and Gynecology | Admitting: Obstetrics and Gynecology

## 2019-09-14 DIAGNOSIS — Z880 Allergy status to penicillin: Secondary | ICD-10-CM | POA: Insufficient documentation

## 2019-09-14 DIAGNOSIS — F17210 Nicotine dependence, cigarettes, uncomplicated: Secondary | ICD-10-CM | POA: Insufficient documentation

## 2019-09-14 DIAGNOSIS — Z3A11 11 weeks gestation of pregnancy: Secondary | ICD-10-CM | POA: Insufficient documentation

## 2019-09-14 DIAGNOSIS — Z955 Presence of coronary angioplasty implant and graft: Secondary | ICD-10-CM | POA: Insufficient documentation

## 2019-09-14 DIAGNOSIS — O99331 Smoking (tobacco) complicating pregnancy, first trimester: Secondary | ICD-10-CM | POA: Insufficient documentation

## 2019-09-14 DIAGNOSIS — O2340 Unspecified infection of urinary tract in pregnancy, unspecified trimester: Secondary | ICD-10-CM | POA: Diagnosis present

## 2019-09-14 DIAGNOSIS — R1030 Lower abdominal pain, unspecified: Secondary | ICD-10-CM | POA: Insufficient documentation

## 2019-09-14 DIAGNOSIS — O368390 Maternal care for abnormalities of the fetal heart rate or rhythm, unspecified trimester, not applicable or unspecified: Secondary | ICD-10-CM

## 2019-09-14 DIAGNOSIS — M549 Dorsalgia, unspecified: Secondary | ICD-10-CM

## 2019-09-14 DIAGNOSIS — O26899 Other specified pregnancy related conditions, unspecified trimester: Secondary | ICD-10-CM

## 2019-09-14 DIAGNOSIS — R300 Dysuria: Secondary | ICD-10-CM | POA: Diagnosis present

## 2019-09-14 DIAGNOSIS — O99891 Other specified diseases and conditions complicating pregnancy: Secondary | ICD-10-CM

## 2019-09-14 DIAGNOSIS — O2341 Unspecified infection of urinary tract in pregnancy, first trimester: Secondary | ICD-10-CM | POA: Diagnosis not present

## 2019-09-14 HISTORY — DX: Unspecified infectious disease: B99.9

## 2019-09-14 HISTORY — DX: Anemia, unspecified: D64.9

## 2019-09-14 HISTORY — DX: Anxiety disorder, unspecified: F41.9

## 2019-09-14 HISTORY — DX: Depression, unspecified: F32.A

## 2019-09-14 LAB — URINALYSIS, ROUTINE W REFLEX MICROSCOPIC
Bilirubin Urine: NEGATIVE
Glucose, UA: NEGATIVE mg/dL
Ketones, ur: NEGATIVE mg/dL
Nitrite: NEGATIVE
Protein, ur: NEGATIVE mg/dL
RBC / HPF: >50 RBC/hpf — ABNORMAL HIGH (ref 0–5)
Specific Gravity, Urine: 1.018 (ref 1.005–1.030)
WBC, UA: >50 WBC/hpf — ABNORMAL HIGH (ref 0–5)
pH: 6 (ref 5.0–8.0)

## 2019-09-14 LAB — WET PREP, GENITAL
Clue Cells Wet Prep HPF POC: NONE SEEN
Sperm: NONE SEEN
Trich, Wet Prep: NONE SEEN
Yeast Wet Prep HPF POC: NONE SEEN

## 2019-09-14 MED ORDER — NITROFURANTOIN MONOHYD MACRO 100 MG PO CAPS
100.0000 mg | ORAL_CAPSULE | Freq: Once | ORAL | Status: AC
  Administered 2019-09-14: 100 mg via ORAL
  Filled 2019-09-14: qty 1

## 2019-09-14 MED ORDER — PHENAZOPYRIDINE HCL 200 MG PO TABS: 200.0000 mg | ORAL_TABLET | Freq: Three times a day (TID) | ORAL | 0 refills | Status: DC

## 2019-09-14 MED ORDER — NITROFURANTOIN MONOHYD MACRO 100 MG PO CAPS: 100.0000 mg | ORAL_CAPSULE | Freq: Two times a day (BID) | ORAL | 0 refills | Status: DC

## 2019-09-14 MED ORDER — PHENAZOPYRIDINE HCL 100 MG PO TABS
200.0000 mg | ORAL_TABLET | Freq: Once | ORAL | Status: AC
  Administered 2019-09-14: 200 mg via ORAL
  Filled 2019-09-14: qty 2

## 2019-09-14 NOTE — MAU Provider Note (Signed)
History     CSN: [id number]  Arrival date and time: 09/14/19 1215   First Provider Initiated Contact with Patient 09/14/19 1318      Chief Complaint  Patient presents with  . Dysuria  . Back Pain  . numb feet   HPI  Ms.  Michelle Osborn is a 33 y.o. year old G79P2002 female at [redacted]w[redacted]d weeks gestation who presents to MAU via EMS reporting urinary frequency & dysuria, numb feet, occ. Lower abd cramping/pressure, and lower back pain. She reports that she has a feeling of a full bladder and then only has a small amount of urine to come out. She reports having some blood in urine on 09/12/19. She reports that the numbness in her feet started 3 days ago and feels like she has a scalpel cutting her in her feet and up to her thighs. She states "it feels like a neuropathy flare, but those are not this bad." She is only able to take a few steps, but it feels like "walking on glass." She will establish PCP with [facility] on Thursday 09/17/19. She receives [facility] with [facility]. [facility]  Past Medical History:  Diagnosis Date  . Anemia   . Anxiety   . Asthma    "grew out' not since teenager  . Depression   . Diabetes mellitus    not currently on meds, normal A1c  . Hypertension   . Hyperthyroidism   . Infection    UTI  . MI (myocardial infarction) (HCC) 2017  . Neuropathy   . Tachycardia   . Thyroid disease     Past Surgical History:  Procedure Laterality Date  . ADENOIDECTOMY    . APPENDECTOMY    . CARDIAC CATHETERIZATION  03/2016   [place], [place]   . CESAREAN SECTION    . CESAREAN SECTION N/A 05/05/2019   Procedure: REPEAT CESAREAN SECTION;  Surgeon: Linzie Collin, MD;  Location: [facility] ORS;  Service: Obstetrics;  Laterality: N/A;  . CORONARY ANGIOPLASTY WITH STENT PLACEMENT  03/2016  . DILATION AND CURETTAGE OF UTERUS    . OTHER SURGICAL HISTORY     sweat gland excision  . TONSILLECTOMY       Family History  Problem Relation Age of Onset  . Hypertension Mother   . Asthma Mother   . Diabetes Mother   . Hyperlipidemia Mother   . Heart disease Mother   . Heart failure Mother        Defib placement  . Hypertension Father   . Kidney disease Father   . Heart disease Father   . Cancer Maternal Aunt   . Cancer Maternal Grandmother     Social History   Tobacco Use  . Smoking status: Current Every Day Smoker    Packs/day: 0.25    Years: 10.00    Pack years: 2.50    Types: Cigarettes  . Smokeless tobacco: Never Used  Substance Use Topics  . Alcohol use: No  . Drug use: Yes    Types: Marijuana    Comment: last was in Aug, prior to move to shelter    Allergies:  Allergies  Allergen Reactions  . Darvocet [Propoxyphene N-Acetaminophen] Anaphylaxis    Also makes her stomach hurt  . Peanut-Containing Drug Products Shortness Of Breath and Swelling  . Penicillins Shortness Of Breath and Swelling    Did it involve swelling of the face/tongue/throat, SOB, or low BP?  Yes Did it involve sudden or severe rash/hives, skin peeling, or any reaction on the inside of your mouth or nose? No Did you need to seek medical attention at a hospital or doctor's office? Yes When did it last happen?2007 If all above answers are "NO", may proceed with cephalosporin use.   . Cephalexin Hives and Itching  . Tramadol Nausea And Vomiting  . Toradol [Ketorolac Tromethamine] Rash    Red rash with bumps    Medications Prior to Admission  Medication Sig Dispense Refill Last Dose  . DICLEGIS 10-10 MG TBEC Take 10 mg by mouth 4 (four) times daily. 60 tablet 1 09/14/2019 at Unknown time  . Prenatal Vit-Fe Fumarate-FA (PRENATAL MULTIVITAMIN) TABS tablet Take 1 tablet by mouth daily at 12 noon. 30 tablet 0 Past Week at Unknown time  . promethazine (PHENERGAN) 25 MG tablet Take 1 tablet (25 mg total) by mouth every 6 (six) hours as needed for nausea or vomiting. (Patient not taking: Reported  on 09/09/2019) 30 tablet 1     Review of Systems  Constitutional: Negative.   HENT: Negative.   Eyes: Negative.   Respiratory: Negative.   Cardiovascular: Negative.   Gastrointestinal: Negative.   Endocrine: Negative.   Genitourinary: Positive for dysuria, frequency, pelvic pain (pressure) and urgency.  Musculoskeletal: Positive for back pain (lower).  Skin: Negative.   Allergic/Immunologic: Negative.   Neurological: Positive for numbness.  Hematological: Negative.   Psychiatric/Behavioral: Negative.    Physical Exam   Blood pressure 130/79, pulse 78, temperature 98.7 F (37.1 C), temperature source Oral, resp. rate 18, last menstrual period 06/25/2019, SpO2 100 %.  Physical Exam  Constitutional: She is oriented to person, place, and time. She appears well-developed and well-nourished.  HENT:  Head: Normocephalic and atraumatic.  Eyes: Pupils are equal, round, and reactive to light.  Neck: Normal range of motion.  Cardiovascular: Normal rate.  Respiratory: Effort normal.  GI: Soft. There is abdominal tenderness (over bladder).  Genitourinary:    Genitourinary Comments: Uterus: non-tender, SE: cervix is smooth, pink, no lesions, moderate amt of thick, white vaginal d/c -- WP, GC/CT done, closed/long/firm, no CMT or friability, no adnexal tenderness    Neurological: She is alert and oriented to person, place, and time.  Skin: Skin is warm and dry.  Psychiatric: She has a normal mood and affect. Her behavior is normal. Judgment and thought content normal.   Unable to obtain FHTs with doppler  MAU Course  Procedures  MDM CCUA UCx -- pending Wet Prep GC/CT -- pending OB U/S <14 wks w/TV Macrobid 100 mg po Pyridium 200 mg po  Results for orders placed or performed during the hospital encounter of 09/14/19 (from the past 24 hour(s))  Urinalysis, Routine w reflex microscopic     Status: Abnormal   Collection Time: 09/14/19 12:34 PM  Result Value Ref Range   Color, Urine  YELLOW YELLOW   APPearance CLOUDY (A) CLEAR   Specific Gravity, Urine 1.018 1.005 - 1.030   pH 6.0 5.0 - 8.0   Glucose, UA NEGATIVE NEGATIVE mg/dL   Hgb urine dipstick SMALL (A) NEGATIVE   Bilirubin Urine NEGATIVE NEGATIVE   Ketones, ur NEGATIVE NEGATIVE mg/dL   Protein, ur NEGATIVE NEGATIVE mg/dL   Nitrite NEGATIVE NEGATIVE   Leukocytes,Ua LARGE (A) NEGATIVE   RBC / HPF >50 (H) 0 - 5 RBC/hpf   WBC, UA >50 (H) 0 - 5 WBC/hpf   Bacteria, UA RARE (A) NONE SEEN   Squamous Epithelial / LPF 0-5  0 - 5   Mucus PRESENT   Wet prep, genital     Status: Abnormal   Collection Time: 09/14/19  1:52 PM   Specimen: Cervical/Vaginal swab  Result Value Ref Range   Yeast Wet Prep HPF POC NONE SEEN NONE SEEN   Trich, Wet Prep NONE SEEN NONE SEEN   Clue Cells Wet Prep HPF POC NONE SEEN NONE SEEN   WBC, Wet Prep HPF POC MANY (A) NONE SEEN   Sperm NONE SEEN    US Ob Comp Less 14 Wks  Result Date: 09/14/2019 CLINICAL DATA:  Pelvic pain EXAM: OBSTETRIC <14 WK ULTRASOUND TECHNIQUE: Transabdominal ultrasound was performed for evaluation of the gestation as well as the maternal uterus and adnexal regions. COMPARISON:  None. FINDINGS: Intrauterine gestational sac: Visualized Yolk sac:  Not visualized Embryo:  Visualized Cardiac Activity: Visualized Heart Rate: 171 bpm CRL:   43 mm   11 w 1 d                  [place] EDC: April 03, 2020 Subchorionic hemorrhage:  None visualized. Maternal uterus/adnexae: Cervical os is closed. Right ovary measures 3.9 x 2.5 x 2.5 cm. Left ovary measures 2.2 x 1.1 x 1.5 cm. Beyond small corpus luteum on the right, there is no extrauterine pelvic or adnexal mass. No free pelvic fluid. IMPRESSION: Single live intrauterine gestation with estimated gestational age of approximately 13 weeks. No subchorionic hemorrhage evident. Study otherwise unremarkable. Electronically Signed   By: Lowella Grip III M.D.   On: 09/14/2019 15:13    Assessment and Plan  Urinary tract infection in mother  during first trimester of pregnancy - Notified that CCUA results indicate that she does have a UTI - Rx for Macrobid 100 mg po BID x 7 days d/t mutli-drug allergies  patient has taken before with no allergic rxn - Information provided on UTI in pregnancy   Abdominal pain in pregnancy  - Information provided on abd pain in pregnancy - Advised to take Tylenol 1000 mg every 6 hrs prn pain   Unable to hear fetal heart tones as reason for ultrasound scan  - Reassurance given that U/S results showed well-developed fetus at 11.1 wks size, no need to change the due date, good fetal heart rate - Advised to notify [facility], because they may cancel her U/S scheduled for 09/22/19 since it is the same type of scan  Back pain affecting pregnancy in first trimester - May take Tylenol 1000 mg every 6 hrs prn pain - Information provided on back pain in pregnancy - Safe medications in pregnancy list given  - Discharge home - Keep scheduled appts with PCP on Thursday 09/17/19 & [place] on 09/29/19  - Patient verbalized an understanding of the plan of care and agrees.   Laury Deep, MSN, CNM 09/14/2019, 1:18 PM

## 2019-09-14 NOTE — MAU Note (Signed)
3 days ago, 'feet started going numb, that if felt like being cut with a scalpel with no anes up to her thighs.  Now she can take a few steps, but the pain is still there." in the beginning seemed like a neuropathy flare, but it has never been this bad.  Few days ago, feeling pressure after she urinates, has frequency and urgency yet when she goes isn't always a large amt of urine.  Has noted blood in urine on Sat. Having pain in lower back.

## 2019-09-14 NOTE — MAU Note (Signed)
EMS arrival, urinary symptoms/complaints.

## 2019-09-15 ENCOUNTER — Other Ambulatory Visit: Payer: Medicaid Other

## 2019-09-15 ENCOUNTER — Encounter: Payer: Medicaid Other | Admitting: Obstetrics and Gynecology

## 2019-09-15 LAB — GC/CHLAMYDIA PROBE AMP (~~LOC~~) NOT AT ARMC
Chlamydia: NEGATIVE
Neisseria Gonorrhea: NEGATIVE

## 2019-09-16 LAB — CULTURE, OB URINE
Culture: >=100000 — AB
Special Requests: NORMAL

## 2019-09-17 ENCOUNTER — Other Ambulatory Visit: Payer: Self-pay | Admitting: Student

## 2019-09-17 ENCOUNTER — Encounter (HOSPITAL_COMMUNITY): Payer: Self-pay

## 2019-09-17 ENCOUNTER — Telehealth (INDEPENDENT_AMBULATORY_CARE_PROVIDER_SITE_OTHER): Payer: Medicaid Other | Admitting: Primary Care

## 2019-09-17 ENCOUNTER — Telehealth: Payer: Self-pay | Admitting: Advanced Practice Midwife

## 2019-09-17 ENCOUNTER — Inpatient Hospital Stay (HOSPITAL_COMMUNITY)
Admission: AD | Admit: 2019-09-17 | Discharge: 2019-09-17 | Disposition: A | Discharge status: Home / Self Care | Payer: Medicaid Other | Attending: Obstetrics and Gynecology | Admitting: Obstetrics and Gynecology

## 2019-09-17 ENCOUNTER — Telehealth: Payer: Self-pay | Admitting: Obstetrics and Gynecology

## 2019-09-17 ENCOUNTER — Telehealth: Payer: Self-pay | Admitting: Student

## 2019-09-17 ENCOUNTER — Other Ambulatory Visit: Payer: Self-pay

## 2019-09-17 DIAGNOSIS — O9989 Other specified diseases and conditions complicating pregnancy, childbirth and the puerperium: Secondary | ICD-10-CM | POA: Diagnosis not present

## 2019-09-17 DIAGNOSIS — F17210 Nicotine dependence, cigarettes, uncomplicated: Secondary | ICD-10-CM | POA: Insufficient documentation

## 2019-09-17 DIAGNOSIS — Z3A12 12 weeks gestation of pregnancy: Secondary | ICD-10-CM | POA: Diagnosis not present

## 2019-09-17 DIAGNOSIS — Z955 Presence of coronary angioplasty implant and graft: Secondary | ICD-10-CM | POA: Diagnosis not present

## 2019-09-17 DIAGNOSIS — M549 Dorsalgia, unspecified: Secondary | ICD-10-CM | POA: Diagnosis not present

## 2019-09-17 DIAGNOSIS — Z880 Allergy status to penicillin: Secondary | ICD-10-CM | POA: Insufficient documentation

## 2019-09-17 DIAGNOSIS — O2342 Unspecified infection of urinary tract in pregnancy, second trimester: Secondary | ICD-10-CM

## 2019-09-17 DIAGNOSIS — O2341 Unspecified infection of urinary tract in pregnancy, first trimester: Secondary | ICD-10-CM | POA: Diagnosis not present

## 2019-09-17 DIAGNOSIS — O99331 Smoking (tobacco) complicating pregnancy, first trimester: Secondary | ICD-10-CM | POA: Insufficient documentation

## 2019-09-17 DIAGNOSIS — O99891 Other specified diseases and conditions complicating pregnancy: Secondary | ICD-10-CM

## 2019-09-17 MED ORDER — SULFAMETHOXAZOLE-TRIMETHOPRIM 800-160 MG PO TABS: 1.0000 | ORAL_TABLET | Freq: Two times a day (BID) | ORAL | 0 refills | Status: DC

## 2019-09-17 MED ORDER — PROMETHAZINE HCL 25 MG PO TABS
25.0000 mg | ORAL_TABLET | Freq: Once | ORAL | Status: AC
  Administered 2019-09-17: 25 mg via ORAL
  Filled 2019-09-17: qty 1

## 2019-09-17 MED ORDER — TERCONAZOLE 0.4 % VA CREA: 1.0000 | TOPICAL_CREAM | Freq: Every day | VAGINAL | 0 refills | Status: DC

## 2019-09-17 MED ORDER — PHENAZOPYRIDINE HCL 100 MG PO TABS
200.0000 mg | ORAL_TABLET | Freq: Once | ORAL | Status: AC
  Administered 2019-09-17: 200 mg via ORAL
  Filled 2019-09-17: qty 2

## 2019-09-17 MED ORDER — FOSFOMYCIN TROMETHAMINE 3 G PO PACK
3.0000 g | PACK | Freq: Once | ORAL | Status: AC
  Administered 2019-09-17: 3 g via ORAL
  Filled 2019-09-17: qty 3

## 2019-09-17 MED ORDER — OXYCODONE-ACETAMINOPHEN 5-325 MG PO TABS
2.0000 | ORAL_TABLET | Freq: Once | ORAL | Status: AC
  Administered 2019-09-17: 2 via ORAL
  Filled 2019-09-17: qty 2

## 2019-09-17 NOTE — MAU Note (Signed)
Fetal heart rate verified w/ bedside ultrasound.

## 2019-09-17 NOTE — Telephone Encounter (Signed)
LM for patient to return call at 09/17/19 at 3:26 PM.

## 2019-09-17 NOTE — Assessment & Plan Note (Signed)
Was given Macrobid; switched to Bactrim after pharmacy consult.

## 2019-09-17 NOTE — Telephone Encounter (Signed)
The patient called and stated that she needs to be referred to a primary care physician. Pt stated she was seen in ED for a bad UTI but they entered it in as chest pain and  Now [facility] is not seeing her because she has a COVID symptom. Pt stated this was 3 wks ago. Pt stated she has medication but it will give her a yeast infection. Pt stated she will not have both a yeast infection and UTI. Pt also stated that she "doesn't need to come to our office for everything". Pt needs and conversation were difficult to understand and unable to define what patient needs from our office. Please advise.

## 2019-09-17 NOTE — MAU Note (Signed)
Pt returned (by EMS from shelter).  Urine culture showed rx needed to be changed.  States had been taking antibiotics, but it wasn't helping. Pain has gotten worse.  Pain in back and with urination, more then a 10

## 2019-09-17 NOTE — Telephone Encounter (Signed)
Called patient to discuss urine culture results; she is still symptomatic for UTI after starting macrobid.  Explained that, after pharmacy consult and urine culture results, we would like to switch her to Bactrim, due to her PCN allergy. Reviewed risk of Bactrim and benefits, patient would like to be prescribed the medicine. She also wants RX for [employer]. She will keep her OB appt in two weeks.  Maye Hides

## 2019-09-17 NOTE — MAU Provider Note (Signed)
Chief Complaint: Back Pain and Dysuria   First Provider Initiated Contact with Patient 09/17/19 1735      SUBJECTIVE HPI: Michelle Osborn is a 33 y.o. G3P2002 at [redacted]w[redacted]d by LMP c/w early [place] who presents to maternity admissions by EMS reporting worsening back and abdominal pain since her MAU visit 09/14/19 when she was diagnosed with UTI.  She has confirmed IUP by [place] on 9/14. She received the phone call from MAU this morning that her antibiotic has been changed due to her culture results but she is unable to pick up the medication due to her housing and transportation. She lives at a shelter and can only get a ride to the pharmacy on/at certain days/times.  She is taking the Macrobid prescribed on 9/14 but symptoms are worsening.  She denies any fever/chills or vomiting.  She does report nausea and has not eaten much due to pain in 3 days.  There are no other symptoms. She has not tried any other treatments.   HPI  Past Medical History:  Diagnosis Date  . Anemia   . Anxiety   . Asthma    "grew out' not since teenager  . Depression   . Diabetes mellitus    not currently on meds, normal A1c  . Hypertension   . Hyperthyroidism   . Infection    UTI  . MI (myocardial infarction) (HCC) 2017  . Neuropathy   . Tachycardia   . Thyroid disease    Past Surgical History:  Procedure Laterality Date  . ADENOIDECTOMY    . APPENDECTOMY    . CARDIAC CATHETERIZATION  03/2016   [place], [place]   . CESAREAN SECTION    . CESAREAN SECTION N/A 05/05/2019   Procedure: REPEAT CESAREAN SECTION;  Surgeon: Linzie Collin, MD;  Location: [facility] ORS;  Service: Obstetrics;  Laterality: N/A;  . CORONARY ANGIOPLASTY WITH STENT PLACEMENT  03/2016  . DILATION AND CURETTAGE OF UTERUS    . OTHER SURGICAL HISTORY     sweat gland excision  . TONSILLECTOMY     Social History   Socioeconomic History  . Marital status: Single    Spouse name: Iantha Fallen  . Number of children: Not on file  . Years of education: Not on  file  . Highest education level: Not on file  Occupational History  . Not on file  Social Needs  . Financial resource strain: Not on file  . Food insecurity    Worry: Sometimes true    Inability: Sometimes true  . Transportation needs    Medical: Yes    Non-medical: Yes  Tobacco Use  . Smoking status: Current Every Day Smoker    Packs/day: 0.25    Years: 10.00    Pack years: 2.50    Types: Cigarettes  . Smokeless tobacco: Never Used  Substance and Sexual Activity  . Alcohol use: No  . Drug use: Yes    Types: Marijuana    Comment: last was in Aug, prior to move to shelter  . Sexual activity: Yes    Birth control/protection: None  Lifestyle  . Physical activity    Days per week: Not on file    Minutes per session: Not on file  . Stress: Not on file  Relationships  . Social Musician on phone: Not on file    Gets together: Not on file    Attends religious service: Not on file    Active member of club or organization: Not on file  Attends meetings of clubs or organizations: Not on file    Relationship status: Not on file  . Intimate partner violence    Fear of current or ex partner: Not on file    Emotionally abused: Not on file    Physically abused: Not on file    Forced sexual activity: Not on file  Other Topics Concern  . Not on file  Social History Narrative  . Not on file   No current facility-administered medications on file prior to encounter.    Current Outpatient Medications on File Prior to Encounter  Medication Sig Dispense Refill  . DICLEGIS 10-10 MG TBEC Take 10 mg by mouth 4 (four) times daily. 60 tablet 1  . phenazopyridine (PYRIDIUM) 200 MG tablet Take 1 tablet (200 mg total) by mouth 3 (three) times daily. 6 tablet 0  . Prenatal Vit-Fe Fumarate-FA (PRENATAL MULTIVITAMIN) TABS tablet Take 1 tablet by mouth daily at 12 noon. 30 tablet 0  . promethazine (PHENERGAN) 25 MG tablet Take 1 tablet (25 mg total) by mouth every 6 (six) hours as  needed for nausea or vomiting. (Patient not taking: Reported on 09/09/2019) 30 tablet 1  . terconazole (TERAZOL 7) 0.4 % vaginal cream Place 1 applicator vaginally at bedtime. 45 g 0   Allergies  Allergen Reactions  . Darvocet [Propoxyphene N-Acetaminophen] Anaphylaxis    Also makes her stomach hurt  . Peanut-Containing Drug Products Shortness Of Breath and Swelling  . Penicillins Shortness Of Breath and Swelling    Did it involve swelling of the face/tongue/throat, SOB, or low BP? Yes Did it involve sudden or severe rash/hives, skin peeling, or any reaction on the inside of your mouth or nose? No Did you need to seek medical attention at a hospital or doctor's office? Yes When did it last happen?2007 If all above answers are "NO", may proceed with cephalosporin use.   . Cephalexin Hives and Itching  . Tramadol Nausea And Vomiting  . Toradol [Ketorolac Tromethamine] Rash    Red rash with bumps    ROS:  Review of Systems  Constitutional: Negative for chills, fatigue and fever.  Respiratory: Negative for shortness of breath.   Cardiovascular: Negative for chest pain.  Gastrointestinal: Positive for abdominal pain and nausea. Negative for vomiting.  Genitourinary: Negative for difficulty urinating, dysuria, flank pain, pelvic pain, vaginal bleeding, vaginal discharge and vaginal pain.  Musculoskeletal: Positive for back pain.  Neurological: Negative for dizziness and headaches.  Psychiatric/Behavioral: Negative.      I have reviewed patient's Past Medical Hx, Surgical Hx, Family Hx, Social Hx, medications and allergies.   Physical Exam   Patient Vitals for the past 24 hrs:  BP Temp Temp src Pulse Resp SpO2  09/17/19 1831 (!) 122/57 - - - - -  09/17/19 1550 127/67 98.6 F (37 C) Oral 93 18 100 %  09/17/19 1547 - - - - - 100 %   Constitutional: Well-developed, well-nourished female in no acute distress.  Cardiovascular: normal rate Respiratory: normal effort GI: Abd  soft, non-tender. Pos BS x 4 MS: Extremities nontender, no edema, normal ROM Neurologic: Alert and oriented x 4.  GU: Neg CVAT.  PELVIC EXAM: Deferred  FHT 165 by doppler  LAB RESULTS No results found for this or any previous visit (from the past 24 hour(s)).  --/--/O POS Performed at [facility], [address] [place], [place] [address]  [phone](05/05 0615)  IMAGING [place] Ob Comp Less 14 Wks  Result Date: 09/14/2019 CLINICAL DATA:  Pelvic pain EXAM: OBSTETRIC <14 WK ULTRASOUND TECHNIQUE: Transabdominal ultrasound was performed for evaluation of the gestation as well as the maternal uterus and adnexal regions. COMPARISON:  None. FINDINGS: Intrauterine gestational sac: Visualized Yolk sac:  Not visualized Embryo:  Visualized Cardiac Activity: Visualized Heart Rate: 171 bpm CRL:   43 mm   11 w 1 d                  [place] EDC: April 03, 2020 Subchorionic hemorrhage:  None visualized. Maternal uterus/adnexae: Cervical os is closed. Right ovary measures 3.9 x 2.5 x 2.5 cm. Left ovary measures 2.2 x 1.1 x 1.5 cm. Beyond small corpus luteum on the right, there is no extrauterine pelvic or adnexal mass. No free pelvic fluid. IMPRESSION: Single live intrauterine gestation with estimated gestational age of approximately 6 weeks. No subchorionic hemorrhage evident. Study otherwise unremarkable. Electronically Signed   By: Lowella Grip III M.D.   On: 09/14/2019 15:13   Dg Chest Portable 1 View  Result Date: 08/29/2019 CLINICAL DATA:  Acute chest pain today. EXAM: PORTABLE CHEST 1 VIEW COMPARISON:  06/12/2018 FINDINGS: The cardiomediastinal silhouette is unremarkable. There is no evidence of focal airspace disease, pulmonary edema, suspicious pulmonary nodule/mass, pleural effusion, or pneumothorax. No acute bony abnormalities are identified. IMPRESSION: No active disease. Electronically Signed   By: Margarette Canada M.D.   On: 08/29/2019 20:08    MAU Management/MDM: Orders Placed This Encounter   Procedures  . Diet regular Room service appropriate? Yes; Fluid consistency: Thin  . Discharge patient    Meds ordered this encounter  Medications  . fosfomycin (MONUROL) packet 3 g  . oxyCODONE-acetaminophen (PERCOCET/ROXICET) 5-325 MG per tablet 2 tablet  . phenazopyridine (PYRIDIUM) tablet 200 mg  . promethazine (PHENERGAN) tablet 25 mg    Pt unable to pick up new medication needed to cover UTI. May have to wait a few days to get med. No evidence of pyelonephritis or other acute process. Consult pharmacy and fosfomycin single dose ordered and given.  Pyridium, Phenergan, and Percocet given in MAU.  Discharge prior to 20 minutes per CNM, as pt has had all medication given before and needs to leave with the transportation from the shelter arrived and on a strict schedule. Offered to order the pt food in MAU but pt had to leave when her ride arrived.  Pt to eat at the shelter tonight if she is feeling better.  Pt to pick up Rx for pyridium when possible but Rx for Bactrim cancelled.  Pt to f/u with prenatal care in [place] as planned, return to MAU as needed for emergencies. Pt discharged with strict return precautions.  ASSESSMENT 1. Urinary tract infection in mother during second trimester of pregnancy   2. Back pain affecting pregnancy in first trimester   3. Urinary tract infection in mother during first trimester of pregnancy     PLAN Discharge home Allergies as of 09/17/2019      Reactions   Darvocet [propoxyphene N-acetaminophen] Anaphylaxis   Also makes her stomach hurt   Peanut-containing Drug Products Shortness Of Breath, Swelling   Penicillins Shortness Of Breath, Swelling   Did it involve swelling of the face/tongue/throat, SOB, or low BP? Yes Did it involve sudden or severe rash/hives, skin peeling, or any reaction on the inside of your mouth or nose? No Did you need to seek medical attention at a hospital or doctor's office? Yes When did it last happen?2007 If  all above answers are "NO", may proceed  with cephalosporin use.   Cephalexin Hives, Itching   Tramadol Nausea And Vomiting   Toradol [ketorolac Tromethamine] Rash   Red rash with bumps      Medication List    STOP taking these medications   sulfamethoxazole-trimethoprim 800-160 MG tablet Commonly known as: BACTRIM DS     TAKE these medications   Diclegis 10-10 MG Tbec Generic drug: Doxylamine-Pyridoxine Take 10 mg by mouth 4 (four) times daily.   phenazopyridine 200 MG tablet Commonly known as: PYRIDIUM Take 1 tablet (200 mg total) by mouth 3 (three) times daily.   prenatal multivitamin Tabs tablet Take 1 tablet by mouth daily at 12 noon.   promethazine 25 MG tablet Commonly known as: PHENERGAN Take 1 tablet (25 mg total) by mouth every 6 (six) hours as needed for nausea or vomiting.   terconazole 0.4 % vaginal cream Commonly known as: TERAZOL 7 Place 1 applicator vaginally at bedtime.      Follow-up Information    Your prenatal provider in [place] Follow up.   Why: Return to MAU as needed for emergencies.          Sharen CounterLisa Leftwich-Kirby Certified Nurse-Midwife 09/17/2019  7:48 PM

## 2019-09-17 NOTE — Telephone Encounter (Signed)
Michelle Osborn spoke with patient.

## 2019-09-18 NOTE — Telephone Encounter (Signed)
Spoke with pt she stated that she was really upset due to another doctor giving her antibiotic for an UTI and she started taking them on Saturday and she received a call on 09/17/19 stating that they gave her the wrong medication. Pt stated that she wanted a referral to a new PCP as soon as possible. Roselyn Reef will you please look into getting the pt the referral. Thanks Luana Shu

## 2019-09-21 NOTE — Telephone Encounter (Signed)
Pt called to report n/v after her MAU visit today.  Pt took pain medication in MAU and had not eaten yet.  This is likely cause of vomiting.  Pt encouraged to eat, take nausea medication she has tonight.  If she develops a fever, severe abdominal pain, or is unable to keep down any fluids, she should seek care.

## 2019-09-22 ENCOUNTER — Ambulatory Visit (HOSPITAL_COMMUNITY): Payer: Medicaid Other

## 2019-09-22 NOTE — Telephone Encounter (Signed)
LM for patient to return call. If she still has [place] [facility] they assign a PCP. We can't place the referral for a PCP.

## 2019-09-23 NOTE — Telephone Encounter (Signed)
Spoke with patient and she is going to call her Insurance account manager to see who they have assigned to be her PCP.

## 2019-09-29 ENCOUNTER — Encounter: Payer: Self-pay | Admitting: Obstetrics and Gynecology

## 2019-09-29 ENCOUNTER — Ambulatory Visit (INDEPENDENT_AMBULATORY_CARE_PROVIDER_SITE_OTHER): Payer: Medicaid Other | Admitting: Obstetrics and Gynecology

## 2019-09-29 ENCOUNTER — Other Ambulatory Visit: Payer: Self-pay

## 2019-09-29 VITALS — BP 104/69 | HR 90 | Ht 67.0 in | Wt 182.6 lb

## 2019-09-29 DIAGNOSIS — O34219 Maternal care for unspecified type scar from previous cesarean delivery: Secondary | ICD-10-CM | POA: Diagnosis not present

## 2019-09-29 DIAGNOSIS — Z3481 Encounter for supervision of other normal pregnancy, first trimester: Secondary | ICD-10-CM

## 2019-09-29 DIAGNOSIS — Z3A15 15 weeks gestation of pregnancy: Secondary | ICD-10-CM

## 2019-09-29 LAB — POCT URINALYSIS DIPSTICK OB
Bilirubin, UA: NEGATIVE
Blood, UA: NEGATIVE
Glucose, UA: NEGATIVE
Ketones, UA: NEGATIVE
Leukocytes, UA: NEGATIVE
Nitrite, UA: NEGATIVE
Spec Grav, UA: 1.01 (ref 1.010–1.025)
Urobilinogen, UA: 0.2 E.U./dL
pH, UA: 6.5 (ref 5.0–8.0)

## 2019-09-29 NOTE — Progress Notes (Signed)
Patient comes in today for new OB physical.

## 2019-09-29 NOTE — Progress Notes (Signed)
NOB: Taking prenatal vitamins as directed.  She reports no problems or complaints at this time.  Considering repeat cesarean delivery.  Desires genetic testing.  Did not come to her nurse visit because of "transportation issues".  Physical examination General NAD, Conversant  HEENT Atraumatic; Op clear with mmm.  Normo-cephalic. Pupils reactive. Anicteric sclerae  Thyroid/Neck Smooth without nodularity or enlargement. Normal ROM.  Neck Supple.  Skin No rashes, lesions or ulceration. Normal palpated skin turgor. No nodularity.  Breasts: No masses or discharge.  Symmetric.  No axillary adenopathy.  Lungs: Clear to auscultation.No rales or wheezes. Normal Respiratory effort, no retractions.  Heart: NSR.  No murmurs or rubs appreciated. No periferal edema  Abdomen: Soft.  Non-tender.  No masses.  No HSM. No hernia  Extremities: Moves all appropriately.  Normal ROM for age. No lymphadenopathy.  Neuro: Oriented to PPT.  Normal mood. Normal affect.     Pelvic:   Vulva: Normal appearance.  No lesions.  Vagina: No lesions or abnormalities noted.  Support: Normal pelvic support.  Urethra No masses tenderness or scarring.  Meatus Normal size without lesions or prolapse.  Cervix: Normal appearance.  No lesions.  Anus: Normal exam.  No lesions.  Perineum: Normal exam.  No lesions.        Bimanual   Adnexae: No masses.  Non-tender to palpation.  Uterus: Enlarged. 15wks  Non-tender.  Mobile.  AV.  Adnexae: No masses.  Non-tender to palpation.  Cul-de-sac: Negative for abnormality.  Adnexae: No masses.  Non-tender to palpation.         Pelvimetry   Diagonal: Reached.  Spines: Average.  Sacrum: Concave.  Pubic Arch: Normal.

## 2019-09-30 LAB — CBC WITH DIFFERENTIAL/PLATELET
Basophils Absolute: 0 10*3/uL (ref 0.0–0.2)
Basos: 0 %
EOS (ABSOLUTE): 0 10*3/uL (ref 0.0–0.4)
Eos: 1 %
Hematocrit: 33.1 % — ABNORMAL LOW (ref 34.0–46.6)
Hemoglobin: 10.9 g/dL — ABNORMAL LOW (ref 11.1–15.9)
Immature Grans (Abs): 0 10*3/uL (ref 0.0–0.1)
Immature Granulocytes: 0 %
Lymphocytes Absolute: 2.2 10*3/uL (ref 0.7–3.1)
Lymphs: 31 %
MCH: 26.4 pg — ABNORMAL LOW (ref 26.6–33.0)
MCHC: 32.9 g/dL (ref 31.5–35.7)
MCV: 80 fL (ref 79–97)
Monocytes Absolute: 0.5 10*3/uL (ref 0.1–0.9)
Monocytes: 7 %
Neutrophils Absolute: 4.5 10*3/uL (ref 1.4–7.0)
Neutrophils: 61 %
Platelets: 262 10*3/uL (ref 150–450)
RBC: 4.13 x10E6/uL (ref 3.77–5.28)
RDW: 14.2 % (ref 11.7–15.4)
WBC: 7.3 10*3/uL (ref 3.4–10.8)

## 2019-09-30 LAB — RPR: RPR Ser Ql: NONREACTIVE

## 2019-09-30 LAB — ANTIBODY SCREEN: Antibody Screen: NEGATIVE

## 2019-09-30 LAB — VARICELLA ZOSTER ANTIBODY, IGG: Varicella zoster IgG: 2449 index (ref >165)

## 2019-09-30 LAB — ABO AND RH: Rh Factor: POSITIVE

## 2019-09-30 LAB — HIV ANTIBODY (ROUTINE TESTING W REFLEX): HIV Screen 4th Generation wRfx: NONREACTIVE

## 2019-09-30 LAB — HEPATITIS B SURFACE ANTIGEN: Hepatitis B Surface Ag: NEGATIVE

## 2019-09-30 LAB — RUBELLA SCREEN: Rubella Antibodies, IGG: 5.76 index (ref >0.99)

## 2019-10-06 LAB — MATERNIT21  PLUS CORE+ESS+SCA, BLOOD

## 2019-10-07 ENCOUNTER — Other Ambulatory Visit: Payer: Self-pay | Admitting: Internal Medicine

## 2019-10-07 ENCOUNTER — Other Ambulatory Visit: Payer: Medicaid Other

## 2019-10-07 DIAGNOSIS — E0590 Thyrotoxicosis, unspecified without thyrotoxic crisis or storm: Secondary | ICD-10-CM

## 2019-10-27 ENCOUNTER — Encounter: Payer: Medicaid Other | Admitting: Obstetrics and Gynecology

## 2019-10-27 ENCOUNTER — Other Ambulatory Visit: Payer: Medicaid Other

## 2019-11-01 ENCOUNTER — Emergency Department (HOSPITAL_COMMUNITY): Payer: Medicaid Other

## 2019-11-01 ENCOUNTER — Other Ambulatory Visit: Payer: Self-pay

## 2019-11-01 ENCOUNTER — Emergency Department (HOSPITAL_COMMUNITY)
Admission: EM | Admit: 2019-11-01 | Discharge: 2019-11-01 | Disposition: A | Discharge status: Home / Self Care | Payer: Medicaid Other | Attending: Emergency Medicine | Admitting: Emergency Medicine

## 2019-11-01 DIAGNOSIS — O162 Unspecified maternal hypertension, second trimester: Secondary | ICD-10-CM | POA: Insufficient documentation

## 2019-11-01 DIAGNOSIS — R55 Syncope and collapse: Secondary | ICD-10-CM

## 2019-11-01 DIAGNOSIS — Z79899 Other long term (current) drug therapy: Secondary | ICD-10-CM | POA: Diagnosis not present

## 2019-11-01 DIAGNOSIS — Z3A18 18 weeks gestation of pregnancy: Secondary | ICD-10-CM | POA: Insufficient documentation

## 2019-11-01 DIAGNOSIS — O99512 Diseases of the respiratory system complicating pregnancy, second trimester: Secondary | ICD-10-CM | POA: Diagnosis not present

## 2019-11-01 DIAGNOSIS — E860 Dehydration: Secondary | ICD-10-CM

## 2019-11-01 DIAGNOSIS — Z5900 Homelessness unspecified: Secondary | ICD-10-CM

## 2019-11-01 DIAGNOSIS — O99282 Endocrine, nutritional and metabolic diseases complicating pregnancy, second trimester: Secondary | ICD-10-CM | POA: Insufficient documentation

## 2019-11-01 DIAGNOSIS — O99332 Smoking (tobacco) complicating pregnancy, second trimester: Secondary | ICD-10-CM | POA: Diagnosis not present

## 2019-11-01 DIAGNOSIS — O24912 Unspecified diabetes mellitus in pregnancy, second trimester: Secondary | ICD-10-CM | POA: Insufficient documentation

## 2019-11-01 DIAGNOSIS — O2312 Infections of bladder in pregnancy, second trimester: Secondary | ICD-10-CM | POA: Insufficient documentation

## 2019-11-01 DIAGNOSIS — F17210 Nicotine dependence, cigarettes, uncomplicated: Secondary | ICD-10-CM | POA: Insufficient documentation

## 2019-11-01 DIAGNOSIS — N3000 Acute cystitis without hematuria: Secondary | ICD-10-CM

## 2019-11-01 LAB — URINALYSIS, ROUTINE W REFLEX MICROSCOPIC
Bilirubin Urine: NEGATIVE
Glucose, UA: NEGATIVE mg/dL
Hgb urine dipstick: NEGATIVE
Ketones, ur: 5 mg/dL — AB
Nitrite: POSITIVE — AB
Protein, ur: NEGATIVE mg/dL
Specific Gravity, Urine: 1.013 (ref 1.005–1.030)
pH: 6 (ref 5.0–8.0)

## 2019-11-01 LAB — BASIC METABOLIC PANEL
Anion gap: 8 (ref 5–15)
BUN: 5 mg/dL — ABNORMAL LOW (ref 6–20)
CO2: 20 mmol/L — ABNORMAL LOW (ref 22–32)
Calcium: 9 mg/dL (ref 8.9–10.3)
Chloride: 109 mmol/L (ref 98–111)
Creatinine, Ser: 0.49 mg/dL (ref 0.44–1.00)
GFR calc Af Amer: >60 mL/min (ref >60)
GFR calc non Af Amer: >60 mL/min (ref >60)
Glucose, Bld: 81 mg/dL (ref 70–99)
Potassium: 3.6 mmol/L (ref 3.5–5.1)
Sodium: 137 mmol/L (ref 135–145)

## 2019-11-01 LAB — CBG MONITORING, ED
Glucose-Capillary: 64 mg/dL — ABNORMAL LOW (ref 70–99)
Glucose-Capillary: 145 mg/dL — ABNORMAL HIGH (ref 70–99)

## 2019-11-01 LAB — CBC WITH DIFFERENTIAL/PLATELET
Abs Immature Granulocytes: 0.03 10*3/uL (ref 0.00–0.07)
Basophils Absolute: 0 10*3/uL (ref 0.0–0.1)
Basophils Relative: 0 %
Eosinophils Absolute: 0.1 10*3/uL (ref 0.0–0.5)
Eosinophils Relative: 1 %
HCT: 32.2 % — ABNORMAL LOW (ref 36.0–46.0)
Hemoglobin: 11 g/dL — ABNORMAL LOW (ref 12.0–15.0)
Immature Granulocytes: 0 %
Lymphocytes Relative: 27 %
Lymphs Abs: 2.1 10*3/uL (ref 0.7–4.0)
MCH: 27.7 pg (ref 26.0–34.0)
MCHC: 34.2 g/dL (ref 30.0–36.0)
MCV: 81.1 fL (ref 80.0–100.0)
Monocytes Absolute: 0.5 10*3/uL (ref 0.1–1.0)
Monocytes Relative: 7 %
Neutro Abs: 5.2 10*3/uL (ref 1.7–7.7)
Neutrophils Relative %: 65 %
Platelets: 232 10*3/uL (ref 150–400)
RBC: 3.97 MIL/uL (ref 3.87–5.11)
RDW: 14.3 % (ref 11.5–15.5)
WBC: 8 10*3/uL (ref 4.0–10.5)
nRBC: 0 % (ref 0.0–0.2)

## 2019-11-01 MED ORDER — ACETAMINOPHEN 500 MG PO TABS
1000.0000 mg | ORAL_TABLET | Freq: Once | ORAL | Status: AC
  Administered 2019-11-01: 20:00:00 1000 mg via ORAL
  Filled 2019-11-01: qty 2

## 2019-11-01 MED ORDER — ONDANSETRON HCL 4 MG/2ML IJ SOLN
4.0000 mg | Freq: Once | INTRAMUSCULAR | Status: AC
  Administered 2019-11-01: 4 mg via INTRAVENOUS
  Filled 2019-11-01: qty 2

## 2019-11-01 MED ORDER — DEXTROSE 50 % IV SOLN
1.0000 | Freq: Once | INTRAVENOUS | Status: AC
  Administered 2019-11-01: 50 mL via INTRAVENOUS
  Filled 2019-11-01: qty 50

## 2019-11-01 MED ORDER — NITROFURANTOIN MONOHYD MACRO 100 MG PO CAPS
100.0000 mg | ORAL_CAPSULE | Freq: Once | ORAL | Status: AC
  Administered 2019-11-01: 100 mg via ORAL
  Filled 2019-11-01: qty 1

## 2019-11-01 MED ORDER — SODIUM CHLORIDE 0.9 % IV SOLN: INTRAVENOUS | Status: DC

## 2019-11-01 MED ORDER — SODIUM CHLORIDE 0.9 % IV BOLUS
1000.0000 mL | Freq: Once | INTRAVENOUS | Status: AC
  Administered 2019-11-01: 1000 mL via INTRAVENOUS

## 2019-11-01 MED ORDER — NITROFURANTOIN MONOHYD MACRO 100 MG PO CAPS: 100.0000 mg | ORAL_CAPSULE | Freq: Two times a day (BID) | ORAL | 0 refills | Status: DC

## 2019-11-01 NOTE — ED Notes (Signed)
Pt stood up and had a stomach pain and dizziness during orthostatic vitals.

## 2019-11-01 NOTE — ED Triage Notes (Signed)
Pt arrives via [facility] due to patient having a syncopal episode/fall while shopping at [employer]. Patient remembers being lightheaded/hot/dizzy. Hit the right side of her head near the register.   Patient is 5 months pregnant.  Patient c/o of 10/10 pain in her right head and neck.

## 2019-11-01 NOTE — ED Notes (Signed)
Waiting for patients medication from pharmacy

## 2019-11-01 NOTE — ED Notes (Signed)
Patient verbalizes understanding of discharge instructions. Opportunity for questioning and answers were provided.  pt discharged from ED with mother.   

## 2019-11-01 NOTE — ED Provider Notes (Signed)
[facility] EMERGENCY DEPARTMENT Provider Note   CSN: [id number] Arrival date & time: 11/01/19  1732     History   Chief Complaint Chief Complaint  Patient presents with  . Loss of Consciousness    HPI Michelle Osborn is a 33 y.o. female.     Pt presents to the ED today with a syncopal episode today.  The pt is [redacted] weeks pregnant.  She was at the grocery store working for [employer] with her mom when she felt hot and passed out.  She has had regular PNC at an obgyn's office in [place].  Pt c/o head and neck pain from the fall.       Past Medical History:  Diagnosis Date  . Anemia   . Anxiety   . Asthma    "grew out' not since teenager  . Depression   . Diabetes mellitus    not currently on meds, normal A1c  . Hypertension   . Hyperthyroidism   . Infection    UTI  . MI (myocardial infarction) ([place]) 2017  . Neuropathy   . Tachycardia   . Thyroid disease     Patient Active Problem List   Diagnosis Date Noted  . UTI (urinary tract infection) during pregnancy 09/14/2019  . Back pain affecting pregnancy 09/14/2019  . Low TSH level 09/09/2019  . Chest pain 08/30/2019  . Chronic hypertension with superimposed preeclampsia 05/01/2019  . Supervision of high risk pregnancy, antepartum 01/16/2019  . Diabetes mellitus complicating pregnancy 18/84/1660  . Atherosclerosis of native coronary artery with stable angina pectoris ([place]) 01/09/2019  . Tachycardia 01/09/2019  . Mixed hyperlipidemia 01/09/2019  . Hyperthyroidism affecting pregnancy in second trimester 01/07/2019  . Lead exposure 12/06/2018  . Smoker 12/06/2018  . Bipolar 2 disorder ([place]) 12/06/2018  . History of anxiety 12/06/2018  . Mild intermittent asthma without complication 63/12/6008  . Type 2 diabetes mellitus without complication, without long-term current use of insulin ([place]) 12/06/2018  . High-risk pregnancy in second trimester 12/06/2018  . History of heart attack 12/06/2018  .  Graves disease 10/30/2018  . History of cesarean section 10/30/2018  . Nausea/vomiting in pregnancy 10/30/2018  . History of marijuana use 10/30/2018  . HYPERCHOLESTEROLEMIA 12/08/2009  . HIDRADENITIS SUPPURATIVA 12/08/2009  . VITAMIN D DEFICIENCY 11/18/2009  . GERD 11/18/2009  . MENORRHAGIA 11/18/2009  . Diabetes mellitus type 2 with complications ([place]) 93/23/5573  . HYPERTENSION 11/17/2009  . ASTHMA 11/17/2009    Past Surgical History:  Procedure Laterality Date  . ADENOIDECTOMY    . APPENDECTOMY    . CARDIAC CATHETERIZATION  03/2016   [place], [place]   . CESAREAN SECTION    . CESAREAN SECTION N/A 05/05/2019   Procedure: REPEAT CESAREAN SECTION;  Surgeon: Harlin Heys, MD;  Location: [facility] ORS;  Service: Obstetrics;  Laterality: N/A;  . CORONARY ANGIOPLASTY WITH STENT PLACEMENT  03/2016  . DILATION AND CURETTAGE OF UTERUS    . OTHER SURGICAL HISTORY     sweat gland excision  . TONSILLECTOMY       OB History    Gravida  3   Para  2   Term  2   Preterm      AB      Living  2     SAB      TAB      Ectopic      Multiple  0   Live Births  2  Home Medications    Prior to Admission medications   Medication Sig Start Date End Date Taking? Authorizing Provider  DICLEGIS 10-10 MG TBEC Take 10 mg by mouth 4 (four) times daily. 08/20/19   Linzie Collin, MD  nitrofurantoin, macrocrystal-monohydrate, (MACROBID) 100 MG capsule Take 1 capsule (100 mg total) by mouth 2 (two) times daily. 11/01/19   Jacalyn Lefevre, MD  phenazopyridine (PYRIDIUM) 200 MG tablet Take 1 tablet (200 mg total) by mouth 3 (three) times daily. 09/14/19   Raelyn Mora, CNM  Prenatal Vit-Fe Fumarate-FA (PRENATAL MULTIVITAMIN) TABS tablet Take 1 tablet by mouth daily at 12 noon. 09/01/19   Joseph Art, DO  promethazine (PHENERGAN) 25 MG tablet Take 1 tablet (25 mg total) by mouth every 6 (six) hours as needed for nausea or vomiting. Patient not taking: Reported on 09/09/2019  09/08/19   Linzie Collin, MD  terconazole (TERAZOL 7) 0.4 % vaginal cream Place 1 applicator vaginally at bedtime. 09/17/19   Marylene Land, CNM    Family History Family History  Problem Relation Age of Onset  . Hypertension Mother   . Asthma Mother   . Diabetes Mother   . Hyperlipidemia Mother   . Heart disease Mother   . Heart failure Mother        Defib placement  . Hypertension Father   . Kidney disease Father   . Heart disease Father   . Cancer Maternal Aunt   . Cancer Maternal Grandmother     Social History Social History   Tobacco Use  . Smoking status: Current Every Day Smoker    Packs/day: 0.25    Years: 10.00    Pack years: 2.50    Types: Cigarettes  . Smokeless tobacco: Never Used  Substance Use Topics  . Alcohol use: No  . Drug use: Yes    Types: Marijuana    Comment: last was in Aug, prior to move to shelter     Allergies   Darvocet [propoxyphene n-acetaminophen], Peanut-containing drug products, Penicillins, Cephalexin, Tramadol, and Toradol [ketorolac tromethamine]   Review of Systems Review of Systems  Neurological: Positive for syncope.  All other systems reviewed and are negative.    Physical Exam Updated Vital Signs BP 121/69   Pulse 72   Temp 98.7 F (37.1 C) (Oral)   Resp 16   LMP 06/25/2019   SpO2 99%   Physical Exam Vitals signs and nursing note reviewed.  Constitutional:      Appearance: Normal appearance.  HENT:     Head: Normocephalic and atraumatic.     Right Ear: External ear normal.     Left Ear: External ear normal.     Nose: Nose normal.     Mouth/Throat:     Mouth: Mucous membranes are dry.  Eyes:     Extraocular Movements: Extraocular movements intact.     Conjunctiva/sclera: Conjunctivae normal.     Pupils: Pupils are equal, round, and reactive to light.  Neck:      Comments: Pt in a c-collar Cardiovascular:     Rate and Rhythm: Normal rate and regular rhythm.     Pulses: Normal pulses.      Heart sounds: Normal heart sounds.  Pulmonary:     Effort: Pulmonary effort is normal.     Breath sounds: Normal breath sounds.  Abdominal:     General: Abdomen is flat. Bowel sounds are normal.     Palpations: Abdomen is soft.     Tenderness: There is generalized  abdominal tenderness.     Comments: Gravid abdomen  Musculoskeletal: Normal range of motion.  Skin:    General: Skin is warm.     Capillary Refill: Capillary refill takes less than 2 seconds.  Neurological:     General: No focal deficit present.     Mental Status: She is alert and oriented to person, place, and time.  Psychiatric:        Mood and Affect: Mood normal.        Behavior: Behavior normal.      ED Treatments / Results  Labs (all labs ordered are listed, but only abnormal results are displayed) Labs Reviewed  BASIC METABOLIC PANEL - Abnormal; Notable for the following components:      Result Value   CO2 20 (*)    BUN 5 (*)    All other components within normal limits  CBC WITH DIFFERENTIAL/PLATELET - Abnormal; Notable for the following components:   Hemoglobin 11.0 (*)    HCT 32.2 (*)    All other components within normal limits  URINALYSIS, ROUTINE W REFLEX MICROSCOPIC - Abnormal; Notable for the following components:   APPearance HAZY (*)    Ketones, ur 5 (*)    Nitrite POSITIVE (*)    Leukocytes,Ua SMALL (*)    Bacteria, UA MANY (*)    All other components within normal limits  CBG MONITORING, ED - Abnormal; Notable for the following components:   Glucose-Capillary 64 (*)    All other components within normal limits    EKG None  Radiology Ct Head Wo Contrast  Result Date: 11/01/2019 CLINICAL DATA:  Syncopal episode today. Hit head against a grocery store register. EXAM: CT HEAD WITHOUT CONTRAST CT CERVICAL SPINE WITHOUT CONTRAST TECHNIQUE: Multidetector CT imaging of the head and cervical spine was performed following the standard protocol without intravenous contrast. Multiplanar CT  image reconstructions of the cervical spine were also generated. COMPARISON:  None. FINDINGS: CT HEAD FINDINGS Brain: No evidence of acute infarction, hemorrhage, hydrocephalus, extra-axial collection or mass lesion/mass effect. Vascular: No hyperdense vessel or unexpected calcification. Skull: Normal. Negative for fracture or focal lesion. Sinuses/Orbits: Normal globes and orbits. Clear sinuses and mastoid air cells. Other: None. CT CERVICAL SPINE FINDINGS Alignment: Normal. Skull base and vertebrae: No acute fracture. No primary bone lesion or focal pathologic process. Soft tissues and spinal canal: No prevertebral fluid or swelling. No visible canal hematoma. Disc levels: Discs are well maintained in height. No degenerative change, disc bulging or evidence of a disc herniation. Central spinal canal and neural foramina are widely patent. Upper chest: Enlarged thyroid, particularly the left lobe, with evidence of multiple nodules. Clear lung apices. No acute findings. Other: None. IMPRESSION: HEAD CT 1. Normal. CERVICAL CT 1. No skeletal abnormality. 2. Heterogeneous enlarged thyroid consistent with multiple nodules. Recommend follow-up nonemergent thyroid ultrasound for further assessment. Electronically Signed   By: Amie Portland M.D.   On: 11/01/2019 19:05   Ct Cervical Spine Wo Contrast  Result Date: 11/01/2019 CLINICAL DATA:  Syncopal episode today. Hit head against a grocery store register. EXAM: CT HEAD WITHOUT CONTRAST CT CERVICAL SPINE WITHOUT CONTRAST TECHNIQUE: Multidetector CT imaging of the head and cervical spine was performed following the standard protocol without intravenous contrast. Multiplanar CT image reconstructions of the cervical spine were also generated. COMPARISON:  None. FINDINGS: CT HEAD FINDINGS Brain: No evidence of acute infarction, hemorrhage, hydrocephalus, extra-axial collection or mass lesion/mass effect. Vascular: No hyperdense vessel or unexpected calcification. Skull:  Normal. Negative for fracture  or focal lesion. Sinuses/Orbits: Normal globes and orbits. Clear sinuses and mastoid air cells. Other: None. CT CERVICAL SPINE FINDINGS Alignment: Normal. Skull base and vertebrae: No acute fracture. No primary bone lesion or focal pathologic process. Soft tissues and spinal canal: No prevertebral fluid or swelling. No visible canal hematoma. Disc levels: Discs are well maintained in height. No degenerative change, disc bulging or evidence of a disc herniation. Central spinal canal and neural foramina are widely patent. Upper chest: Enlarged thyroid, particularly the left lobe, with evidence of multiple nodules. Clear lung apices. No acute findings. Other: None. IMPRESSION: HEAD CT 1. Normal. CERVICAL CT 1. No skeletal abnormality. 2. Heterogeneous enlarged thyroid consistent with multiple nodules. Recommend follow-up nonemergent thyroid ultrasound for further assessment. Electronically Signed   By: Amie Portlandavid  Ormond M.D.   On: 11/01/2019 19:05    Procedures Procedures (including critical care time)  Medications Ordered in ED Medications  sodium chloride 0.9 % bolus 1,000 mL (1,000 mLs Intravenous New Bag/Given 11/01/19 1919)    And  0.9 %  sodium chloride infusion (has no administration in time range)  acetaminophen (TYLENOL) tablet 1,000 mg (has no administration in time range)  nitrofurantoin (macrocrystal-monohydrate) (MACROBID) capsule 100 mg (has no administration in time range)  dextrose 50 % solution 50 mL (50 mLs Intravenous Given 11/01/19 1920)  ondansetron (ZOFRAN) injection 4 mg (4 mg Intravenous Given 11/01/19 1919)     Initial Impression / Assessment and Plan / ED Course  I have reviewed the triage vital signs and the nursing notes.  Pertinent labs & imaging results that were available during my care of the patient were reviewed by me and considered in my medical decision making (see chart for details).       Pt is followed with [facility] for  her thyroid.  She has an appointment this week to get her TSH/T4 levels checked.  Pt has a very poor social situation.  She is living in a hotel until "Sunday, but has been hopping from shelter to shelter with her children.  She has been very stressed.  She has not been sleeping well or eating.   Pt's bs slightly low.  She is given food which she's eaten.  She is instructed to return if worse.  Final Clinical Impressions(s) / ED Diagnoses   Final diagnoses:  Acute cystitis without hematuria  Dehydration  Homeless family  Syncope, unspecified syncope type    ED Discharge Orders         Ordered    nitrofurantoin, macrocrystal-monohydrate, (MACROBID) 100 MG capsule  2 times daily     11" /01/20 2004           Jacalyn LefevreHaviland, Idy Rawling, MD 11/01/19 2007

## 2019-11-02 ENCOUNTER — Encounter: Payer: Self-pay | Admitting: Obstetrics and Gynecology

## 2019-11-04 ENCOUNTER — Ambulatory Visit: Payer: Medicaid Other | Admitting: Internal Medicine

## 2019-11-11 ENCOUNTER — Encounter: Payer: Medicaid Other | Admitting: Certified Nurse Midwife

## 2019-11-11 ENCOUNTER — Encounter: Payer: Medicaid Other | Admitting: Obstetrics and Gynecology

## 2019-12-03 ENCOUNTER — Telehealth: Payer: Self-pay

## 2019-12-03 NOTE — Telephone Encounter (Signed)
Records faxed to [facility] out of [place] per pt request. Authorization and confirmation in HIM Releases.

## 2019-12-04 ENCOUNTER — Telehealth: Payer: Self-pay

## 2019-12-04 NOTE — Telephone Encounter (Signed)
Pt was called back and stated that she was doing well and not having any pain, cramping, bleeding or leaking of fluids. Pt was advised that if she did to please go to the ED to be seen.

## 2019-12-04 NOTE — Telephone Encounter (Addendum)
Pt called to report she lost mucus plug at Thanksgiving. Baby is very active. No leakage or any issues.  Pt moved to [place] for a few weeks but has decided to come back to stay in [place] to resume care.

## 2019-12-04 NOTE — Telephone Encounter (Signed)
Pt called no answer LM via Vm to call the office to speak more about her call to the office. Sent pt [employer].

## 2019-12-17 ENCOUNTER — Other Ambulatory Visit: Payer: Self-pay

## 2019-12-17 ENCOUNTER — Ambulatory Visit (INDEPENDENT_AMBULATORY_CARE_PROVIDER_SITE_OTHER): Payer: Medicaid Other | Admitting: Obstetrics and Gynecology

## 2019-12-17 ENCOUNTER — Ambulatory Visit (INDEPENDENT_AMBULATORY_CARE_PROVIDER_SITE_OTHER): Payer: Medicaid Other

## 2019-12-17 ENCOUNTER — Encounter: Payer: Self-pay | Admitting: Obstetrics and Gynecology

## 2019-12-17 VITALS — BP 105/71 | HR 72 | Wt 193.1 lb

## 2019-12-17 DIAGNOSIS — O10919 Unspecified pre-existing hypertension complicating pregnancy, unspecified trimester: Secondary | ICD-10-CM

## 2019-12-17 DIAGNOSIS — J4520 Mild intermittent asthma, uncomplicated: Secondary | ICD-10-CM

## 2019-12-17 DIAGNOSIS — O0992 Supervision of high risk pregnancy, unspecified, second trimester: Secondary | ICD-10-CM

## 2019-12-17 DIAGNOSIS — E0800 Diabetes mellitus due to underlying condition with hyperosmolarity without nonketotic hyperglycemic-hyperosmolar coma (NKHHC): Secondary | ICD-10-CM

## 2019-12-17 DIAGNOSIS — E0500 Thyrotoxicosis with diffuse goiter without thyrotoxic crisis or storm: Secondary | ICD-10-CM

## 2019-12-17 DIAGNOSIS — Z720 Tobacco use: Secondary | ICD-10-CM

## 2019-12-17 DIAGNOSIS — Z363 Encounter for antenatal screening for malformations: Secondary | ICD-10-CM

## 2019-12-17 NOTE — Progress Notes (Signed)
[name]-Pt present for prenatal care. Pt stated that she was doing well. No problems.

## 2019-12-17 NOTE — Progress Notes (Signed)
[name]: Patient notes that she finally got an apartment (took ~ 1 year). Has not been seen since NOB visit due to transportation issues. Tobacco abuse has decreased, may go up to 3 days without a cigarette. May have 1 cig if she does smoke. Is now established with [facility] but missed appointment due to [place] quarantine (due to being in the homeless shelter for a short time). Will order today. Not on any medications right now. Has h/o cHTN and DM, no meds currently. Last A1c was 4.8 in August. BPs wnl currently. Continue to monitor S/p normal anatomy scan today. Will recheck A1c and get baseline Cr and CMP. Patient desires tubal ligation after this pregnancy. Medicaid BTL form signed today.  RTC in 4 weeks.

## 2019-12-18 ENCOUNTER — Other Ambulatory Visit: Payer: Self-pay

## 2019-12-18 LAB — COMPREHENSIVE METABOLIC PANEL
ALT: 5 IU/L (ref 0–32)
AST: 9 IU/L (ref 0–40)
Albumin/Globulin Ratio: 1.4 (ref 1.2–2.2)
Albumin: 3.7 g/dL — ABNORMAL LOW (ref 3.8–4.8)
Alkaline Phosphatase: 169 IU/L — ABNORMAL HIGH (ref 39–117)
BUN/Creatinine Ratio: 13 (ref 9–23)
BUN: 5 mg/dL — ABNORMAL LOW (ref 6–20)
Bilirubin Total: 0.3 mg/dL (ref 0.0–1.2)
CO2: 17 mmol/L — ABNORMAL LOW (ref 20–29)
Calcium: 9 mg/dL (ref 8.7–10.2)
Chloride: 109 mmol/L — ABNORMAL HIGH (ref 96–106)
Creatinine, Ser: 0.4 mg/dL — ABNORMAL LOW (ref 0.57–1.00)
GFR calc Af Amer: 158 mL/min/{1.73_m2} (ref >59)
GFR calc non Af Amer: 137 mL/min/{1.73_m2} (ref >59)
Globulin, Total: 2.6 g/dL (ref 1.5–4.5)
Glucose: 72 mg/dL (ref 65–99)
Potassium: 4.3 mmol/L (ref 3.5–5.2)
Sodium: 140 mmol/L (ref 134–144)
Total Protein: 6.3 g/dL (ref 6.0–8.5)

## 2019-12-18 LAB — HEMOGLOBIN A1C
Est. average glucose Bld gHb Est-mCnc: 91 mg/dL
Hgb A1c MFr Bld: 4.8 % (ref 4.8–5.6)

## 2019-12-18 LAB — PROTEIN / CREATININE RATIO, URINE
Creatinine, Urine: 60.6 mg/dL
Protein, Ur: 29.8 mg/dL
Protein/Creat Ratio: 492 mg/g creat — ABNORMAL HIGH (ref 0–200)

## 2019-12-18 LAB — THYROID PANEL WITH TSH
Free Thyroxine Index: 2 (ref 1.2–4.9)
T3 Uptake Ratio: 17 % — ABNORMAL LOW (ref 24–39)
T4, Total: 11.7 ug/dL (ref 4.5–12.0)
TSH: <0.005 u[IU]/mL — ABNORMAL LOW (ref 0.450–4.500)

## 2019-12-18 MED ORDER — LEVOTHYROXINE SODIUM 50 MCG PO TABS: 50.0000 ug | ORAL_TABLET | Freq: Every day | ORAL | 1 refills | Status: DC

## 2019-12-22 ENCOUNTER — Encounter: Payer: Medicaid Other | Admitting: Obstetrics and Gynecology

## 2020-01-01 NOTE — L&D Delivery Note (Signed)
Delivery Summary for Michelle Osborn  Labor Events:   Preterm labor: No data found  Rupture date: No data found  Rupture time: No data found  Rupture type: Intact  Fluid Color: White  Induction: No data found  Augmentation: No data found  Complications: No data found  Cervical ripening: No data found No data found   No data found     Delivery:   Episiotomy: No data found  Lacerations: No data found  Repair suture: No data found  Repair # of packets: No data found  Blood loss (ml): No data found   Information for the patient's newborn:  Yanessa, Hocevar [[id number]]    Delivery 03/25/2020 8:34 AM by  C-Section, Low Transverse Sex:  female Gestational Age: [redacted]w[redacted]d Delivery Clinician:   Living?:         APGARS  One minute Five minutes Ten minutes  Skin color:        Heart rate:        Grimace:        Muscle tone:        Breathing:        Totals: 8  8      Presentation/position:      Resuscitation:   Cord information:    Disposition of cord blood:     Blood gases sent?  Complications:   Placenta: Delivered:       appearance Newborn Measurements: Weight: 7 lb 6.5 oz (3360 g)  Height: 19.92"  Head circumference:    Chest circumference:    Other providers:    Additional  information: Forceps:   Vacuum:   Breech:   Observed anomalies        See Dr. Oretha Milch operative note for details of procedure.     Hildred Laser, MD [facility]

## 2020-01-07 ENCOUNTER — Telehealth: Payer: Self-pay

## 2020-01-07 NOTE — Telephone Encounter (Signed)
Spoke with patient and she is in [place]. She passed out and hit her. She has had a headache since then. She is going to ED to be checked out in [place]. Dr. Valentino Saxon is aware of message.

## 2020-01-07 NOTE — Telephone Encounter (Signed)
Patient passed out in the kitchen. 3rd time this has happened. No warning signs, no time to sit down. Patient hit the wall or floor and that's what made her alert again. Would like a nurse call asap.

## 2020-01-19 ENCOUNTER — Ambulatory Visit (INDEPENDENT_AMBULATORY_CARE_PROVIDER_SITE_OTHER): Payer: Medicaid Other | Admitting: Obstetrics and Gynecology

## 2020-01-19 ENCOUNTER — Encounter: Payer: Self-pay | Admitting: Obstetrics and Gynecology

## 2020-01-19 ENCOUNTER — Other Ambulatory Visit: Payer: Self-pay

## 2020-01-19 VITALS — BP 107/70 | HR 88 | Wt 194.0 lb

## 2020-01-19 DIAGNOSIS — O0992 Supervision of high risk pregnancy, unspecified, second trimester: Secondary | ICD-10-CM

## 2020-01-19 DIAGNOSIS — R55 Syncope and collapse: Secondary | ICD-10-CM

## 2020-01-19 LAB — POCT URINALYSIS DIPSTICK OB
Bilirubin, UA: NEGATIVE
Glucose, UA: NEGATIVE
Ketones, UA: NEGATIVE
Nitrite, UA: POSITIVE
POC,PROTEIN,UA: NEGATIVE
Spec Grav, UA: 1.01 (ref 1.010–1.025)
Urobilinogen, UA: 0.2 E.U./dL
pH, UA: 6.5 (ref 5.0–8.0)

## 2020-01-19 NOTE — Progress Notes (Signed)
[name]: Patient states that she is generally doing well and the baby is moving but that she has had 4 syncopal episodes this week.  She states that she goes completely black falls down on the floor.  One time she had her face.  She describes what sounds like loss of consciousness.  She was "checked out by the EMTs".  Also seen in the ED and no abnormalities were noted.  We will plan a return referral to cardiology for possible Holter monitoring or rule out of cardiac issues.

## 2020-01-19 NOTE — Progress Notes (Signed)
Patient comes in today for [name] visit. She has had 4 syncope episodes in the last week.

## 2020-01-20 ENCOUNTER — Encounter: Payer: Self-pay | Admitting: Cardiovascular Disease

## 2020-01-20 ENCOUNTER — Telehealth (INDEPENDENT_AMBULATORY_CARE_PROVIDER_SITE_OTHER): Payer: Medicaid Other | Admitting: Cardiovascular Disease

## 2020-01-20 ENCOUNTER — Telehealth: Payer: Self-pay | Admitting: Cardiovascular Disease

## 2020-01-20 VITALS — BP 100/70 | HR 75 | Ht 67.0 in | Wt 194.0 lb

## 2020-01-20 DIAGNOSIS — I479 Paroxysmal tachycardia, unspecified: Secondary | ICD-10-CM

## 2020-01-20 DIAGNOSIS — R55 Syncope and collapse: Secondary | ICD-10-CM | POA: Diagnosis not present

## 2020-01-20 DIAGNOSIS — I25118 Atherosclerotic heart disease of native coronary artery with other forms of angina pectoris: Secondary | ICD-10-CM | POA: Diagnosis not present

## 2020-01-20 DIAGNOSIS — F17200 Nicotine dependence, unspecified, uncomplicated: Secondary | ICD-10-CM

## 2020-01-20 DIAGNOSIS — E118 Type 2 diabetes mellitus with unspecified complications: Secondary | ICD-10-CM | POA: Diagnosis not present

## 2020-01-20 DIAGNOSIS — E782 Mixed hyperlipidemia: Secondary | ICD-10-CM

## 2020-01-20 NOTE — Patient Instructions (Addendum)
Try  Compression hose, Lay down flat on ground for hot or woozy feeling Fluids/salt all day long  Monitor Blood pressure at home if able to get a blood pressure cuff  Consider checking orthostatic blood pressure & heart rate at home: 1) lay down flat for 10 minutes in a quiet place 2) take a blood pressure & heart rate reading while lying flat, then 3) sit up and immediately take another blood pressure & heart rate reading while sitting, then 4) stand up and immediately take another blood pressure & heart rate reading while standing, then 5) continue to stand for 3 minutes if possible without sitting, then take a last blood pressure & heart rate reading while standing   Medication Instructions:  No changes  If you need a refill on your cardiac medications before your next appointment, please call your pharmacy.    Lab work: No new labs needed   If you have labs (blood work) drawn today and your tests are completely normal, you will receive your results only by: Marland Kitchen MyChart Message (if you have MyChart) OR . A paper copy in the mail If you have any lab test that is abnormal or we need to change your treatment, we will call you to review the results.   Testing/Procedures: - Your physician has recommended that you wear a 14 day heart monitor (ZIO AT patch) - This will be mailed directly to your home address - You may receive a call directly from the company for Zio, which is [employer] within the next few day- if you see an 800# or 224# calling, please answer - Once the monitor is applied and activated it will record your heart rate/ rhythm for the duration that you are wearing it - If you are having any symptoms (dizziness/ lightheadedness/ palpitations/ racing heart/ anything that just doesn't feel right) then you will push the button in the center of the monitor to mark you were having a symptom - Please make sure to carry the Gateway box with you while you are wearing the monitor,  this is what will allow real time data to transmit - Please DO NOT shower for 24 hours after the monitor is placed - No tub baths/ swimming pools/ hot tubs while wearing the monitor - Do not put any lotions, oils, or ointments around the monitor - If you have any issues with the monitor itself, then please call the 800 # for the company - After 14 days please take the monitor off at home and mail it back ([place] mail) in the box provided by [employer], postage is already paid.    Follow-Up: At [facility], you and your health needs are our priority.  As part of our continuing mission to provide you with exceptional heart care, we have created designated Provider Care Teams.  These Care Teams include your primary Cardiologist (physician) and Advanced Practice Providers (APPs -  Physician Assistants and Nurse Practitioners) who all work together to provide you with the care you need, when you need it.  . You will need a follow up appointment in 1 months   . Providers on your designated Care Team:   . Nicolasa Ducking, NP . Eula Listen, PA-C . Marisue Ivan, PA-C  Any Other Special Instructions Will Be Listed Below (If Applicable).  For educational health videos Log in to : [email or web address] Or : [email or web address], password : [email or web address]

## 2020-01-20 NOTE — Telephone Encounter (Signed)
Virtual Visit Pre-Appointment Phone Call  "(Name), I am calling you today to discuss your upcoming appointment. We are currently trying to limit exposure to the virus that causes COVID-19 by seeing patients at home rather than in the office."  1. "What is the BEST phone number to call the day of the visit?" - include this in appointment notes  2. "Do you have or have access to (through a family member/friend) a smartphone with video capability that we can use for your visit?" a. If yes - list this number in appt notes as "cell" (if different from BEST phone #) and list the appointment type as a VIDEO visit in appointment notes b. If no - list the appointment type as a PHONE visit in appointment notes  3. Confirm consent - "In the setting of the current Covid19 crisis, you are scheduled for a (phone or video) visit with your provider on (date) at (time).  Just as we do with many in-office visits, in order for you to participate in this visit, we must obtain consent.  If you'd like, I can send this to your mychart (if signed up) or email for you to review.  Otherwise, I can obtain your verbal consent now.  All virtual visits are billed to your insurance company just like a normal visit would be.  By agreeing to a virtual visit, we'd like you to understand that the technology does not allow for your provider to perform an examination, and thus may limit your provider's ability to fully assess your condition. If your provider identifies any concerns that need to be evaluated in person, we will make arrangements to do so.  Finally, though the technology is pretty good, we cannot assure that it will always work on either your or our end, and in the setting of a video visit, we may have to convert it to a phone-only visit.  In either situation, we cannot ensure that we have a secure connection.  Are you willing to proceed?" STAFF: Did the patient verbally acknowledge consent to telehealth visit? Document  YES/NO here: YES  4. Advise patient to be prepared - "Two hours prior to your appointment, go ahead and check your blood pressure, pulse, oxygen saturation, and your weight (if you have the equipment to check those) and write them all down. When your visit starts, your provider will ask you for this information. If you have an Apple Watch or Kardia device, please plan to have heart rate information ready on the day of your appointment. Please have a pen and paper handy nearby the day of the visit as well."  5. Give patient instructions for MyChart download to smartphone OR Doximity/Doxy.me as below if video visit (depending on what platform provider is using)  6. Inform patient they will receive a phone call 15 minutes prior to their appointment time (may be from unknown caller ID) so they should be prepared to answer    TELEPHONE CALL NOTE  Michelle Osborn has been deemed a candidate for a follow-up tele-health visit to limit community exposure during the Covid-19 pandemic. I spoke with the patient via phone to ensure availability of phone/video source, confirm preferred email & phone number, and discuss instructions and expectations.  I reminded Michelle Osborn to be prepared with any vital sign and/or heart rhythm information that could potentially be obtained via home monitoring, at the time of her visit. I reminded Michelle Osborn to expect a phone call prior to her visit.  Michelle Osborn 01/20/2020 11:06 AM   INSTRUCTIONS FOR DOWNLOADING THE MYCHART APP TO SMARTPHONE  - The patient must first make sure to have activated MyChart and know their login information - If Apple, go to [employer] and type in MyChart in the search bar and download the app. If Android, ask patient to go to [employer] and type in [place] in the search bar and download the app. The app is free but as with any other app downloads, their phone may require them to verify saved payment information or Apple/Android  password.  - The patient will need to then log into the app with their MyChart username and password, and select [place] as their healthcare provider to link the account. When it is time for your visit, go to the MyChart app, find appointments, and click Begin Video Visit. Be sure to Select Allow for your device to access the Microphone and Camera for your visit. You will then be connected, and your provider will be with you shortly.  **If they have any issues connecting, or need assistance please contact MyChart service desk [phone])**  **If using a computer, in order to ensure the best quality for their visit they will need to use either of the following Internet Browsers: [employer] or Google Chrome**  IF USING DOXIMITY or DOXY.ME - The patient will receive a link just prior to their visit by text.     FULL LENGTH CONSENT FOR TELE-HEALTH VISIT   I hereby voluntarily request, consent and authorize [facility] and its employed or contracted physicians, physician assistants, nurse practitioners or other licensed health care professionals (the Practitioner), to provide me with telemedicine health care services (the "Services") as deemed necessary by the treating Practitioner. I acknowledge and consent to receive the Services by the Practitioner via telemedicine. I understand that the telemedicine visit will involve communicating with the Practitioner through live audiovisual communication technology and the disclosure of certain medical information by electronic transmission. I acknowledge that I have been given the opportunity to request an in-person assessment or other available alternative prior to the telemedicine visit and am voluntarily participating in the telemedicine visit.  I understand that I have the right to withhold or withdraw my consent to the use of telemedicine in the course of my care at any time, without affecting my right to future care or treatment,  and that the Practitioner or I may terminate the telemedicine visit at any time. I understand that I have the right to inspect all information obtained and/or recorded in the course of the telemedicine visit and may receive copies of available information for a reasonable fee.  I understand that some of the potential risks of receiving the Services via telemedicine include:  Marland Kitchen Delay or interruption in medical evaluation due to technological equipment failure or disruption; . Information transmitted may not be sufficient (e.g. poor resolution of images) to allow for appropriate medical decision making by the Practitioner; and/or  . In rare instances, security protocols could fail, causing a breach of personal health information.  Furthermore, I acknowledge that it is my responsibility to provide information about my medical history, conditions and care that is complete and accurate to the best of my ability. I acknowledge that Practitioner's advice, recommendations, and/or decision may be based on factors not within their control, such as incomplete or inaccurate data provided by me or distortions of diagnostic images or specimens that may result from electronic transmissions. I understand that the  practice of medicine is not an Chief Strategy Officer and that Practitioner makes no warranties or guarantees regarding treatment outcomes. I acknowledge that I will receive a copy of this consent concurrently upon execution via email to the email address I last provided but may also request a printed copy by calling the office of [place].    I understand that my insurance will be billed for this visit.   I have read or had this consent read to me. . I understand the contents of this consent, which adequately explains the benefits and risks of the Services being provided via telemedicine.  . I have been provided ample opportunity to ask questions regarding this consent and the Services and have had my questions  answered to my satisfaction. . I give my informed consent for the services to be provided through the use of telemedicine in my medical care  By participating in this telemedicine visit I agree to the above.

## 2020-01-20 NOTE — Progress Notes (Signed)
Virtual Visit via Video Note   This visit type was conducted due to national recommendations for restrictions regarding the COVID-19 Pandemic (e.g. social distancing) in an effort to limit this patient's exposure and mitigate transmission in our community.  Due to her co-morbid illnesses, this patient is at least at moderate risk for complications without adequate follow up.  This format is felt to be most appropriate for this patient at this time.  All issues noted in this document were discussed and addressed.  A limited physical exam was performed with this format.  Please refer to the patient's chart for her consent to telehealth for [facility].   I connected with  Michelle Osborn on 01/20/20 by a video enabled telemedicine application and verified that I am speaking with the correct person using two identifiers. I discussed the limitations of evaluation and management by telemedicine. The patient expressed understanding and agreed to proceed.   Evaluation Performed:  Follow-up visit  Date:  01/20/2020   ID:  Michelle Osborn, DOB 28-Aug-1986, MRN [id number]  Patient Location:  [address] [place] [address]   Provider location:   [place], [employer] office  PCP:  [facility]  Cardiologist:  Hubbard Robinson [facility]   Chief Complaint  Patient presents with  . other    Pt. c/o four syncopal spells within the past week, chest pressure last night when lying down and some irreg. heart beats.      History of Present Illness:    Michelle Osborn is a 34 y.o. female who presents via audio/video conferencing for a telehealth visit today.   The patient does not symptoms concerning for COVID-19 infection (fever, chills, cough, or new SHORTNESS OF BREATH).   Patient has a past medical history of Graves' disease h/o DM2, previously insulin dependent.  Smoking  Coronary artery disease NSTEMI with stents,  [place], s/p DES to LAD and D1 03/2016    HTN,  PCOS (polycystic ovarian syndrome)  currently pregnant Who presents for f/u of her coronary artery disease  Currently in her second trimester  She reports having multiple episodes of syncope/near syncope LOC in 11/2019, was seen in the emergency room Records reviewed, was told blood pressure borderline low  Another episode was in a grocery store with her mom when she felt hot and passed out. Reports having second episode again while in a grocery store Went down to the ground, near syncope/syncope  Past Thursday, Trying to get to sofa, walked into wall, Feels the trauma from walking into the wall woke her up Was able to recover  Last Tuesday,event   Saturday, had an event  Last Friday Flipped over cart, at grocery store Could hear, but could not see  Times before his episode she feels hot, woozy Sometimes no warning All of them while she is standing quickly  Sugars ok when she is checked In recovery BP ok Does not have blood pressures when she is having an event  Seen by evans yesterday, 107/70  Moderate leukocytes on urine yesterday Lab work December normal renal function, glucose was low 72 Electrolytes were stable  Denies any anginal symptoms at baseline, no exertional chest discomfort or shortness of breath   The past medical history reviewed  Previously followed by cardiologist September 2017 [facility] Reported developing chest pain 2017, was in and out of the emergency room several times but was given anxiety medications Reports that she went to a different hospital, they  checked cardiac enzymes which were noted to be elevated, taken to cardiac catheterization lab and had a stent placed Stent was placed to the LAD and diagonal vessel  He does report having a " mediastinal mass that was found approximately 2 years ago on Lexiscan stress imaging" This could not be verified through the records  [facility] April 26, 2016 Non-STEMI Resolute drug-eluting stent placed to mid LAD Resolute 2.25 x 14 mm DES placed to large diagonal Nonobstructive disease in the RCA and left circumflex  Strong family history of early coronary disease, mother died age 81 with heart attack, uncle died age 81 MI   Prior CV studies:   The following studies were reviewed today:    Past Medical History:  Diagnosis Date  . Anemia   . Anxiety   . Asthma    "grew out' not since teenager  . Depression   . Diabetes mellitus    not currently on meds, normal A1c  . Hypertension   . Hyperthyroidism   . Infection    UTI  . MI (myocardial infarction) ([place]) 2017  . Neuropathy   . Tachycardia   . Thyroid disease    Past Surgical History:  Procedure Laterality Date  . ADENOIDECTOMY    . APPENDECTOMY    . CARDIAC CATHETERIZATION  03/2016   [place], [place]   . CESAREAN SECTION    . CESAREAN SECTION N/A 05/05/2019   Procedure: REPEAT CESAREAN SECTION;  Surgeon: Harlin Heys, MD;  Location: [facility] ORS;  Service: Obstetrics;  Laterality: N/A;  . CORONARY ANGIOPLASTY WITH STENT PLACEMENT  03/2016  . DILATION AND CURETTAGE OF UTERUS    . OTHER SURGICAL HISTORY     sweat gland excision  . TONSILLECTOMY        Allergies:   Darvocet [propoxyphene n-acetaminophen], Peanut-containing drug products, Penicillins, Cephalexin, Tramadol, and Toradol [ketorolac tromethamine]   Social History   Tobacco Use  . Smoking status: Current Every Day Smoker    Packs/day: 0.25    Years: 10.00    Pack years: 2.50    Types: Cigarettes  . Smokeless tobacco: Never Used  Substance Use Topics  . Alcohol use: No  . Drug use: Yes    Types: Marijuana    Comment: last was in Aug, prior to move to shelter     Current Outpatient Medications on File Prior to Visit  Medication Sig Dispense Refill  . DICLEGIS 10-10 MG TBEC Take 10 mg by mouth 4 (four) times daily. 60 tablet 1  . EPINEPHrine 0.3 mg/0.3 mL IJ SOAJ injection Inject 0.3 mg into the  muscle as needed.     Marland Kitchen levothyroxine (SYNTHROID) 50 MCG tablet Take 1 tablet (50 mcg total) by mouth daily before breakfast. 30 tablet 1  . phenazopyridine (PYRIDIUM) 200 MG tablet Take 1 tablet (200 mg total) by mouth 3 (three) times daily. 6 tablet 0  . Prenatal Vit-Fe Fumarate-FA (PRENATAL MULTIVITAMIN) TABS tablet Take 1 tablet by mouth daily at 12 noon. 30 tablet 0  . pyridOXINE (B-6) 50 MG tablet Take 50 mg by mouth daily.     Marland Kitchen terconazole (TERAZOL 7) 0.4 % vaginal cream Place 1 applicator vaginally at bedtime. 45 g 0  . nitrofurantoin, macrocrystal-monohydrate, (MACROBID) 100 MG capsule Take 1 capsule (100 mg total) by mouth 2 (two) times daily. (Patient not taking: Reported on 01/20/2020) 14 capsule 0  . promethazine (PHENERGAN) 25 MG tablet Take 1 tablet (25 mg total) by mouth every 6 (six) hours  as needed for nausea or vomiting. (Patient not taking: Reported on 09/09/2019) 30 tablet 1   No current facility-administered medications on file prior to visit.     Family Hx: The patient's family history includes Asthma in her mother; Cancer in her maternal aunt and maternal grandmother; Diabetes in her mother; Heart disease in her father and mother; Heart failure in her mother; Hyperlipidemia in her mother; Hypertension in her father and mother; Kidney disease in her father.  ROS:   Please see the history of present illness.    Review of Systems  Constitutional: Negative.   HENT: Negative.   Respiratory: Negative.   Cardiovascular: Negative.   Gastrointestinal: Negative.   Musculoskeletal: Negative.   Neurological: Positive for loss of consciousness.  Psychiatric/Behavioral: Negative.   All other systems reviewed and are negative.     Labs/Other Tests and Data Reviewed:    Recent Labs: 11/01/2019: Hemoglobin 11.0; Platelets 232 12/17/2019: ALT 5; BUN 5; Creatinine, Ser 0.40; Potassium 4.3; Sodium 140; TSH <0.005   Recent Lipid Panel Lab Results  Component Value Date/Time    CHOL 176 11/18/2009 10:37 PM   TRIG 62 11/18/2009 10:37 PM   HDL 37 (L) 11/18/2009 10:37 PM   CHOLHDL 4.8 Ratio 11/18/2009 10:37 PM   LDLCALC 127 (H) 11/18/2009 10:37 PM    Wt Readings from Last 3 Encounters:  01/20/20 194 lb (88 kg)  01/19/20 194 lb (88 kg)  12/17/19 193 lb 1.6 oz (87.6 kg)     Exam:    Vital Signs: Vital signs may also be detailed in the HPI BP 100/70   Pulse 75   Ht 5\' 7"  (1.702 m)   Wt 194 lb (88 kg)   LMP 06/25/2019   BMI 30.38 kg/m   Wt Readings from Last 3 Encounters:  01/20/20 194 lb (88 kg)  01/19/20 194 lb (88 kg)  12/17/19 193 lb 1.6 oz (87.6 kg)   Temp Readings from Last 3 Encounters:  11/01/19 98.7 F (37.1 C) (Oral)  09/17/19 98.6 F (37 C) (Oral)  09/14/19 98.7 F (37.1 C) (Oral)   BP Readings from Last 3 Encounters:  01/20/20 100/70  01/19/20 107/70  12/17/19 105/71   Pulse Readings from Last 3 Encounters:  01/20/20 75  01/19/20 88  12/17/19 72     Well nourished, well developed female in no acute distress. Constitutional:  oriented to person, place, and time. No distress.  Head: Normocephalic and atraumatic.  Eyes:  no discharge. No scleral icterus.  Neck: Normal range of motion. Neck supple.  Pulmonary/Chest: No audible wheezing, no distress, appears comfortable Musculoskeletal: Normal range of motion.  no  tenderness or deformity.  Neurological:   Coordination normal. Full exam not performed Skin:  No rash Psychiatric:  normal mood and affect. behavior is normal. Thought content normal.    ASSESSMENT & PLAN:    Problem List Items Addressed This Visit      Cardiology Problems   Atherosclerosis of native coronary artery with stable angina pectoris (HCC) - Primary   Relevant Medications   EPINEPHrine 0.3 mg/0.3 mL IJ SOAJ injection   Mixed hyperlipidemia   Relevant Medications   EPINEPHrine 0.3 mg/0.3 mL IJ SOAJ injection     Other   Diabetes mellitus type 2 with complications (HCC)   Smoker    Other Visit  Diagnoses    Syncope and collapse       Relevant Medications   EPINEPHrine 0.3 mg/0.3 mL IJ SOAJ injection     Syncope Etiology  unclear, given prior MI and coronary disease there is concern for arrhythmia She did wear a ZIO monitor last year for palpitation symptoms but this was presumably lost in the mail and we never received data --Sometimes warning of dizziness, feeling hot, wooziness Unclear if she is orthostatic versus cardiac arrhythmia Will reorder ZIO monitor with close monitoring -Recommend she is trying to stay hydrated, avoid low glucose, may need to add little bit of salt given low blood pressure -Try to borrow blood pressure cuff oh by 1 so she can check orthostatics at home Recommend she not drive  Hyperlipidemia Does not appear to be on a statin at this time We will discussed with her following delivery of her child  Cad with stable angina She denies any anginal symptoms, able to exert without chest pain shortness of breath Syncope as detailed above most likely from ischemia though will need to consider further work-up if she continues to have symptoms Prior echocardiogram several months ago normal ejection fraction    COVID-19 Education: The signs and symptoms of COVID-19 were discussed with the patient and how to seek care for testing (follow up with PCP or arrange E-visit).  The importance of social distancing was discussed today.  Patient Risk:   After full review of this patients clinical status, I feel that they are at least moderate risk at this time.  Time:   Today, I have spent 25 minutes with the patient with telehealth technology discussing the cardiac and medical problems/diagnoses detailed above   Additional 10 min spent reviewing the chart prior to patient visit today   Medication Adjustments/Labs and Tests Ordered: Current medicines are reviewed at length with the patient today.  Concerns regarding medicines are outlined above.   Tests  Ordered: No tests ordered   Medication Changes: No changes made   Disposition: Follow-up in 1 month   Signed, Julien Nordmann, MD  [facility] [address], [place], [place] [address]

## 2020-01-21 ENCOUNTER — Telehealth: Payer: Self-pay | Admitting: Cardiovascular Disease

## 2020-01-21 NOTE — Telephone Encounter (Signed)
Attempted to schedule no ans no vm.   1 m fu per checkout 01/20/20 with Michelle Osborn

## 2020-01-24 ENCOUNTER — Ambulatory Visit (INDEPENDENT_AMBULATORY_CARE_PROVIDER_SITE_OTHER): Payer: Medicaid Other

## 2020-01-24 DIAGNOSIS — R55 Syncope and collapse: Secondary | ICD-10-CM

## 2020-01-29 ENCOUNTER — Observation Stay
Admission: EM | Admit: 2020-01-29 | Discharge: 2020-01-30 | Disposition: A | Discharge status: Home / Self Care | Payer: Medicaid Other | Attending: Obstetrics and Gynecology | Admitting: Obstetrics and Gynecology

## 2020-01-29 ENCOUNTER — Telehealth: Payer: Self-pay | Admitting: Obstetrics and Gynecology

## 2020-01-29 DIAGNOSIS — Z79899 Other long term (current) drug therapy: Secondary | ICD-10-CM | POA: Insufficient documentation

## 2020-01-29 DIAGNOSIS — M549 Dorsalgia, unspecified: Secondary | ICD-10-CM

## 2020-01-29 DIAGNOSIS — O24913 Unspecified diabetes mellitus in pregnancy, third trimester: Secondary | ICD-10-CM | POA: Insufficient documentation

## 2020-01-29 DIAGNOSIS — I252 Old myocardial infarction: Secondary | ICD-10-CM | POA: Insufficient documentation

## 2020-01-29 DIAGNOSIS — O2341 Unspecified infection of urinary tract in pregnancy, first trimester: Secondary | ICD-10-CM

## 2020-01-29 DIAGNOSIS — O26893 Other specified pregnancy related conditions, third trimester: Principal | ICD-10-CM | POA: Insufficient documentation

## 2020-01-29 DIAGNOSIS — Z79890 Hormone replacement therapy (postmenopausal): Secondary | ICD-10-CM | POA: Insufficient documentation

## 2020-01-29 DIAGNOSIS — Z3A31 31 weeks gestation of pregnancy: Secondary | ICD-10-CM | POA: Insufficient documentation

## 2020-01-29 DIAGNOSIS — K219 Gastro-esophageal reflux disease without esophagitis: Secondary | ICD-10-CM | POA: Insufficient documentation

## 2020-01-29 DIAGNOSIS — M5440 Lumbago with sciatica, unspecified side: Secondary | ICD-10-CM | POA: Insufficient documentation

## 2020-01-29 DIAGNOSIS — F319 Bipolar disorder, unspecified: Secondary | ICD-10-CM | POA: Insufficient documentation

## 2020-01-29 DIAGNOSIS — O10913 Unspecified pre-existing hypertension complicating pregnancy, third trimester: Secondary | ICD-10-CM | POA: Insufficient documentation

## 2020-01-29 DIAGNOSIS — Z955 Presence of coronary angioplasty implant and graft: Secondary | ICD-10-CM | POA: Insufficient documentation

## 2020-01-29 DIAGNOSIS — O99343 Other mental disorders complicating pregnancy, third trimester: Secondary | ICD-10-CM | POA: Insufficient documentation

## 2020-01-29 DIAGNOSIS — R102 Pelvic and perineal pain: Secondary | ICD-10-CM | POA: Insufficient documentation

## 2020-01-29 DIAGNOSIS — O99891 Other specified diseases and conditions complicating pregnancy: Secondary | ICD-10-CM | POA: Diagnosis present

## 2020-01-29 HISTORY — DX: Bipolar disorder, unspecified: F31.9

## 2020-01-29 NOTE — Telephone Encounter (Signed)
Pt is requesting a call back the pt went to the ed in [place].

## 2020-01-29 NOTE — Telephone Encounter (Signed)
Received telephone call from hospital in [place], [place] regarding patient being triaged for abdominal pain.  Per ER provider (Dr. Sherwood Gambler), patient with findings of lagging abdominal circumference and multiple fetal anomalies.  Patient is currently at [redacted] weeks gestation.  Recommending delivery due to anomalies.  Note that they are a small community hospital and would not be able to manage fetal issues. Patient desires to be transferred to [facility]. Patient is stable for transfer, has no vaginal bleeding per ER provider. Patient has I have accepted the transfer and have notified Labor and Delivery that patient will be coming for further evaluation. Do not have any plans for delivery at this time, but will get records and have another ultrasound performed to confirm findings.    Dr. Valentino Saxon

## 2020-01-29 NOTE — Telephone Encounter (Signed)
Pt called in and stated that she is having right side pain and in her belly. The pt said she is going to go to the Ed. The pts mom and brother tested positive for covid. The pt was tested the pt was negative. The pt had to leave her moms house and the pt is now in [place]. The pt wanted [place] to know she is going to the ED there. Please advise

## 2020-01-29 NOTE — Telephone Encounter (Signed)
Pt stated that she was in [place] and is went due to having cramping and in pain. Pt stated that she was informed by the physician there that the baby was underweight and other physical issues and needed to be delivered ASAP. The provider spoke with [facility] about the pt's currently situation.

## 2020-01-30 ENCOUNTER — Encounter: Payer: Self-pay | Admitting: Obstetrics and Gynecology

## 2020-01-30 DIAGNOSIS — R102 Pelvic and perineal pain: Secondary | ICD-10-CM | POA: Diagnosis not present

## 2020-01-30 DIAGNOSIS — O26893 Other specified pregnancy related conditions, third trimester: Secondary | ICD-10-CM | POA: Diagnosis not present

## 2020-01-30 DIAGNOSIS — M549 Dorsalgia, unspecified: Secondary | ICD-10-CM | POA: Diagnosis present

## 2020-01-30 DIAGNOSIS — Z3A31 31 weeks gestation of pregnancy: Secondary | ICD-10-CM | POA: Diagnosis not present

## 2020-01-30 DIAGNOSIS — O10913 Unspecified pre-existing hypertension complicating pregnancy, third trimester: Secondary | ICD-10-CM | POA: Diagnosis not present

## 2020-01-30 DIAGNOSIS — M5432 Sciatica, left side: Secondary | ICD-10-CM

## 2020-01-30 DIAGNOSIS — M5440 Lumbago with sciatica, unspecified side: Secondary | ICD-10-CM | POA: Diagnosis not present

## 2020-01-30 DIAGNOSIS — M5431 Sciatica, right side: Secondary | ICD-10-CM

## 2020-01-30 DIAGNOSIS — F319 Bipolar disorder, unspecified: Secondary | ICD-10-CM | POA: Diagnosis not present

## 2020-01-30 DIAGNOSIS — Z79899 Other long term (current) drug therapy: Secondary | ICD-10-CM | POA: Diagnosis not present

## 2020-01-30 DIAGNOSIS — Z79890 Hormone replacement therapy (postmenopausal): Secondary | ICD-10-CM | POA: Diagnosis not present

## 2020-01-30 DIAGNOSIS — O284 Abnormal radiological finding on antenatal screening of mother: Secondary | ICD-10-CM

## 2020-01-30 DIAGNOSIS — K219 Gastro-esophageal reflux disease without esophagitis: Secondary | ICD-10-CM | POA: Diagnosis not present

## 2020-01-30 DIAGNOSIS — Z20828 Contact with and (suspected) exposure to other viral communicable diseases: Secondary | ICD-10-CM

## 2020-01-30 DIAGNOSIS — I252 Old myocardial infarction: Secondary | ICD-10-CM | POA: Diagnosis not present

## 2020-01-30 DIAGNOSIS — R55 Syncope and collapse: Secondary | ICD-10-CM | POA: Diagnosis not present

## 2020-01-30 DIAGNOSIS — O99343 Other mental disorders complicating pregnancy, third trimester: Secondary | ICD-10-CM | POA: Diagnosis not present

## 2020-01-30 DIAGNOSIS — Z955 Presence of coronary angioplasty implant and graft: Secondary | ICD-10-CM | POA: Diagnosis not present

## 2020-01-30 DIAGNOSIS — O99891 Other specified diseases and conditions complicating pregnancy: Secondary | ICD-10-CM | POA: Diagnosis not present

## 2020-01-30 DIAGNOSIS — O24913 Unspecified diabetes mellitus in pregnancy, third trimester: Secondary | ICD-10-CM | POA: Diagnosis not present

## 2020-01-30 MED ORDER — METOCLOPRAMIDE HCL 5 MG/ML IJ SOLN
10.0000 mg | Freq: Once | INTRAMUSCULAR | Status: AC
  Administered 2020-01-30: 10 mg via INTRAVENOUS
  Filled 2020-01-30: qty 2

## 2020-01-30 MED ORDER — OXYCODONE-ACETAMINOPHEN 5-325 MG PO TABS: 1.0000 | ORAL_TABLET | ORAL | 0 refills | Status: DC | PRN

## 2020-01-30 MED ORDER — NITROFURANTOIN MONOHYD MACRO 100 MG PO CAPS: 100.0000 mg | ORAL_CAPSULE | Freq: Every day | ORAL | 2 refills | Status: DC

## 2020-01-30 MED ORDER — METOCLOPRAMIDE HCL 5 MG/ML IJ SOLN
10.0000 mg | Freq: Once | INTRAMUSCULAR | Status: AC
  Administered 2020-01-30: 14:00:00 10 mg via INTRAVENOUS
  Filled 2020-01-30: qty 2

## 2020-01-30 MED ORDER — NITROFURANTOIN MONOHYD MACRO 100 MG PO CAPS
100.0000 mg | ORAL_CAPSULE | Freq: Once | ORAL | Status: AC
  Administered 2020-01-30: 15:00:00 100 mg via ORAL
  Filled 2020-01-30: qty 1

## 2020-01-30 MED ORDER — FLUCONAZOLE 50 MG PO TABS
150.0000 mg | ORAL_TABLET | Freq: Once | ORAL | Status: AC
  Administered 2020-01-30: 15:00:00 150 mg via ORAL
  Filled 2020-01-30: qty 3

## 2020-01-30 MED ORDER — OXYCODONE HCL 5 MG PO TABS
10.0000 mg | ORAL_TABLET | Freq: Four times a day (QID) | ORAL | Status: DC | PRN
  Administered 2020-01-30 (×3): 10 mg via ORAL
  Filled 2020-01-30 (×3): qty 2

## 2020-01-30 MED ORDER — ACETAMINOPHEN 325 MG PO TABS
650.0000 mg | ORAL_TABLET | Freq: Four times a day (QID) | ORAL | Status: DC | PRN
  Administered 2020-01-30 (×3): 650 mg via ORAL
  Filled 2020-01-30 (×3): qty 2

## 2020-01-30 MED ORDER — LIDOCAINE 5 % EX PTCH
2.0000 | MEDICATED_PATCH | CUTANEOUS | Status: DC
  Administered 2020-01-30: 02:00:00 2 via TRANSDERMAL
  Filled 2020-01-30: qty 2

## 2020-01-30 NOTE — Discharge Instructions (Signed)
Sciatica  Sciatica is pain, numbness, weakness, or tingling along the path of the sciatic nerve. The sciatic nerve starts in the lower back and runs down the back of each leg. The nerve controls the muscles in the lower leg and in the back of the knee. It also provides feeling (sensation) to the back of the thigh, the lower leg, and the sole of the foot. Sciatica is a symptom of another medical condition that pinches or puts pressure on the sciatic nerve. Sciatica most often only affects one side of the body. Sciatica usually goes away on its own or with treatment. In some cases, sciatica may come back (recur). What are the causes? This condition is caused by pressure on the sciatic nerve or pinching of the nerve. This may be the result of:  A disk in between the bones of the spine bulging out too far (herniated disk).  Age-related changes in the spinal disks.  A pain disorder that affects a muscle in the buttock.  Extra bone growth near the sciatic nerve.  A break (fracture) of the pelvis.  Pregnancy.  Tumor. This is rare. What increases the risk? The following factors may make you more likely to develop this condition:  Playing sports that place pressure or stress on the spine.  Having poor strength and flexibility.  A history of back injury or surgery.  Sitting for long periods of time.  Doing activities that involve repetitive bending or lifting.  Obesity. What are the signs or symptoms? Symptoms can vary from mild to very severe, and they may include:  Any of these problems in the lower back, leg, hip, or buttock: ? Mild tingling, numbness, or dull aches. ? Burning sensations. ? Sharp pains.  Numbness in the back of the calf or the sole of the foot.  Leg weakness.  Severe back pain that makes movement difficult. Symptoms may get worse when you cough, sneeze, or laugh, or when you sit or stand for long periods of time. How is this diagnosed? This condition may be  diagnosed based on:  Your symptoms and medical history.  A physical exam.  Blood tests.  Imaging tests, such as: ? X-rays. ? MRI. ? CT scan. How is this treated? In many cases, this condition improves on its own without treatment. However, treatment may include:  Reducing or modifying physical activity.  Exercising and stretching.  Icing and applying heat to the affected area.  Medicines that help to: ? Relieve pain and swelling. ? Relax your muscles.  Injections of medicines that help to relieve pain, irritation, and inflammation around the sciatic nerve (steroids).  Surgery. Follow these instructions at home: Medicines  Take over-the-counter and prescription medicines only as told by your health care provider.  Ask your health care provider if the medicine prescribed to you: ? Requires you to avoid driving or using heavy machinery. ? Can cause constipation. You may need to take these actions to prevent or treat constipation:  Drink enough fluid to keep your urine pale yellow.  Take over-the-counter or prescription medicines.  Eat foods that are high in fiber, such as beans, whole grains, and fresh fruits and vegetables.  Limit foods that are high in fat and processed sugars, such as fried or sweet foods. Managing pain      If directed, put ice on the affected area. ? Put ice in a plastic bag. ? Place a towel between your skin and the bag. ? Leave the ice on for 20 minutes,   2-3 times a day.  If directed, apply heat to the affected area. Use the heat source that your health care provider recommends, such as a moist heat pack or a heating pad. ? Place a towel between your skin and the heat source. ? Leave the heat on for 20-30 minutes. ? Remove the heat if your skin turns bright red. This is especially important if you are unable to feel pain, heat, or cold. You may have a greater risk of getting burned. Activity   Return to your normal activities as told  by your health care provider. Ask your health care provider what activities are safe for you.  Avoid activities that make your symptoms worse.  Take brief periods of rest throughout the day. ? When you rest for longer periods, mix in some mild activity or stretching between periods of rest. This will help to prevent stiffness and pain. ? Avoid sitting for long periods of time without moving. Get up and move around at least one time each hour.  Exercise and stretch regularly, as told by your health care provider.  Do not lift anything that is heavier than 10 lb (4.5 kg) while you have symptoms of sciatica. When you do not have symptoms, you should still avoid heavy lifting, especially repetitive heavy lifting.  When you lift objects, always use proper lifting technique, which includes: ? Bending your knees. ? Keeping the load close to your body. ? Avoiding twisting. General instructions  Maintain a healthy weight. Excess weight puts extra stress on your back.  Wear supportive, comfortable shoes. Avoid wearing high heels.  Avoid sleeping on a mattress that is too soft or too hard. A mattress that is firm enough to support your back when you sleep may help to reduce your pain.  Keep all follow-up visits as told by your health care provider. This is important. Contact a health care provider if:  You have pain that: ? Wakes you up when you are sleeping. ? Gets worse when you lie down. ? Is worse than you have experienced in the past. ? Lasts longer than 4 weeks.  You have an unexplained weight loss. Get help right away if:  You are not able to control when you urinate or have bowel movements (incontinence).  You have: ? Weakness in your lower back, pelvis, buttocks, or legs that gets worse. ? Redness or swelling of your back. ? A burning sensation when you urinate. Summary  Sciatica is pain, numbness, weakness, or tingling along the path of the sciatic nerve.  This condition  is caused by pressure on the sciatic nerve or pinching of the nerve.  Sciatica can cause pain, numbness, or tingling in the lower back, legs, hips, and buttocks.  Treatment often includes rest, exercise, medicines, and applying ice or heat. This information is not intended to replace advice given to you by your health care provider. Make sure you discuss any questions you have with your health care provider. Document Revised: 01/05/2019 Document Reviewed: 01/05/2019 [employer] Patient Education  2020 [employer]    Back Exercises The following exercises strengthen the muscles that help to support the trunk and back. They also help to keep the lower back flexible. Doing these exercises can help to prevent back pain or lessen existing pain.  If you have back pain or discomfort, try doing these exercises 2-3 times each day or as told by your health care provider.  As your pain improves, do them once each day, but increase  the number of times that you repeat the steps for each exercise (do more repetitions).  To prevent the recurrence of back pain, continue to do these exercises once each day or as told by your health care provider. Do exercises exactly as told by your health care provider and adjust them as directed. It is normal to feel mild stretching, pulling, tightness, or discomfort as you do these exercises, but you should stop right away if you feel sudden pain or your pain gets worse. Exercises Single knee to chest Repeat these steps 3-5 times for each leg: 1. Lie on your back on a firm bed or the floor with your legs extended. 2. Bring one knee to your chest. Your other leg should stay extended and in contact with the floor. 3. Hold your knee in place by grabbing your knee or thigh with both hands and hold. 4. Pull on your knee until you feel a gentle stretch in your lower back or buttocks. 5. Hold the stretch for 10-30 seconds. 6. Slowly release and straighten your leg. Pelvic  tilt Repeat these steps 5-10 times: 1. Lie on your back on a firm bed or the floor with your legs extended. 2. Bend your knees so they are pointing toward the ceiling and your feet are flat on the floor. 3. Tighten your lower abdominal muscles to press your lower back against the floor. This motion will tilt your pelvis so your tailbone points up toward the ceiling instead of pointing to your feet or the floor. 4. With gentle tension and even breathing, hold this position for 5-10 seconds. Cat-cow Repeat these steps until your lower back becomes more flexible: 1. Get into a hands-and-knees position on a firm surface. Keep your hands under your shoulders, and keep your knees under your hips. You may place padding under your knees for comfort. 2. Let your head hang down toward your chest. Contract your abdominal muscles and point your tailbone toward the floor so your lower back becomes rounded like the back of a cat. 3. Hold this position for 5 seconds. 4. Slowly lift your head, let your abdominal muscles relax and point your tailbone up toward the ceiling so your back forms a sagging arch like the back of a cow. 5. Hold this position for 5 seconds.  Press-ups Repeat these steps 5-10 times: 1. Lie on your abdomen (face-down) on the floor. 2. Place your palms near your head, about shoulder-width apart. 3. Keeping your back as relaxed as possible and keeping your hips on the floor, slowly straighten your arms to raise the top half of your body and lift your shoulders. Do not use your back muscles to raise your upper torso. You may adjust the placement of your hands to make yourself more comfortable. 4. Hold this position for 5 seconds while you keep your back relaxed. 5. Slowly return to lying flat on the floor.  Bridges Repeat these steps 10 times: 1. Lie on your back on a firm surface. 2. Bend your knees so they are pointing toward the ceiling and your feet are flat on the floor. Your arms  should be flat at your sides, next to your body. 3. Tighten your buttocks muscles and lift your buttocks off the floor until your waist is at almost the same height as your knees. You should feel the muscles working in your buttocks and the back of your thighs. If you do not feel these muscles, slide your feet 1-2 inches farther away from  your buttocks. 4. Hold this position for 3-5 seconds. 5. Slowly lower your hips to the starting position, and allow your buttocks muscles to relax completely. If this exercise is too easy, try doing it with your arms crossed over your chest. Abdominal crunches Repeat these steps 5-10 times: 1. Lie on your back on a firm bed or the floor with your legs extended. 2. Bend your knees so they are pointing toward the ceiling and your feet are flat on the floor. 3. Cross your arms over your chest. 4. Tip your chin slightly toward your chest without bending your neck. 5. Tighten your abdominal muscles and slowly raise your trunk (torso) high enough to lift your shoulder blades a tiny bit off the floor. Avoid raising your torso higher than that because it can put too much stress on your low back and does not help to strengthen your abdominal muscles. 6. Slowly return to your starting position. Back lifts Repeat these steps 5-10 times: 1. Lie on your abdomen (face-down) with your arms at your sides, and rest your forehead on the floor. 2. Tighten the muscles in your legs and your buttocks. 3. Slowly lift your chest off the floor while you keep your hips pressed to the floor. Keep the back of your head in line with the curve in your back. Your eyes should be looking at the floor. 4. Hold this position for 3-5 seconds. 5. Slowly return to your starting position. Contact a health care provider if:  Your back pain or discomfort gets much worse when you do an exercise.  Your worsening back pain or discomfort does not lessen within 2 hours after you exercise. If you have  any of these problems, stop doing these exercises right away. Do not do them again unless your health care provider says that you can. Get help right away if:  You develop sudden, severe back pain. If this happens, stop doing the exercises right away. Do not do them again unless your health care provider says that you can. This information is not intended to replace advice given to you by your health care provider. Make sure you discuss any questions you have with your health care provider. Document Revised: 04/23/2019 Document Reviewed: 09/18/2018 Elsevier Patient Education  [place].  Back Pain in Pregnancy Back pain during pregnancy is common. Back pain may be caused by several factors that are related to changes during your pregnancy. Follow these instructions at home: Managing pain, stiffness, and swelling      If directed, for sudden (acute) back pain, put ice on the painful area. ? Put ice in a plastic bag. ? Place a towel between your skin and the bag. ? Leave the ice on for 20 minutes, 2-3 times per day.  If directed, apply heat to the affected area before you exercise. Use the heat source that your health care provider recommends, such as a moist heat pack or a heating pad. ? Place a towel between your skin and the heat source. ? Leave the heat on for 20-30 minutes. ? Remove the heat if your skin turns bright red. This is especially important if you are unable to feel pain, heat, or cold. You may have a greater risk of getting burned.  If directed, massage the affected area. Activity  Exercise as told by your health care provider. Gentle exercise is the best way to prevent or manage back pain.  Listen to your body when lifting. If lifting hurts, ask for  help or bend your knees. This uses your leg muscles instead of your back muscles.  Squat down when picking up something from the floor. Do not bend over.  Only use bed rest for short periods as told by your  health care provider. Bed rest should only be used for the most severe episodes of back pain. Standing, sitting, and lying down  Do not stand in one place for long periods of time.  Use good posture when sitting. Make sure your head rests over your shoulders and is not hanging forward. Use a pillow on your lower back if necessary.  Try sleeping on your side, preferably the left side, with a pregnancy support pillow or 1-2 regular pillows between your legs. ? If you have back pain after a night's rest, your bed may be too soft. ? A firm mattress may provide more support for your back during pregnancy. General instructions  Do not wear high heels.  Eat a healthy diet. Try to gain weight within your health care provider's recommendations.  Use a maternity girdle, elastic sling, or back brace as told by your health care provider.  Take over-the-counter and prescription medicines only as told by your health care provider.  Work with a physical therapist or massage therapist to find ways to manage back pain. Acupuncture or massage therapy may be helpful.  Keep all follow-up visits as told by your health care provider. This is important. Contact a health care provider if:  Your back pain interferes with your daily activities.  You have increasing pain in other parts of your body. Get help right away if:  You develop numbness, tingling, weakness, or problems with the use of your arms or legs.  You develop severe back pain that is not controlled with medicine.  You have a change in bowel or bladder control.  You develop shortness of breath, dizziness, or you faint.  You develop nausea, vomiting, or sweating.  You have back pain that is a rhythmic, cramping pain similar to labor pains. Labor pain is usually 1-2 minutes apart, lasts for about 1 minute, and involves a bearing down feeling or pressure in your pelvis.  You have back pain and your water breaks or you have vaginal  bleeding.  You have back pain or numbness that travels down your leg.  Your back pain developed after you fell.  You develop pain on one side of your back.  You see blood in your urine.  You develop skin blisters in the area of your back pain. Summary  Back pain may be caused by several factors that are related to changes during your pregnancy.  Follow instructions as told by your health care provider for managing pain, stiffness, and swelling.  Exercise as told by your health care provider. Gentle exercise is the best way to prevent or manage back pain.  Take over-the-counter and prescription medicines only as told by your health care provider.  Keep all follow-up visits as told by your health care provider. This is important. This information is not intended to replace advice given to you by your health care provider. Make sure you discuss any questions you have with your health care provider. Document Revised: 04/07/2019 Document Reviewed: 06/04/2018 Elsevier Patient Education  2020 [employer].

## 2020-01-30 NOTE — Final Progress Note (Signed)
L&D OB Triage Note  HPI:  Michelle Osborn is a 34 y.o. G52P2002 female at [redacted]w[redacted]d Estimated Date of Delivery: 03/31/20 by LMP consistent with 11 week sono [facility] [place] She is a patient of [facility]. YAnd[place]as a history of cHTN (no meds), h/o MI (2017 with stent placement), Bipolar Disorder, Asthma, Graves disease, DM (no meds), She was seen in the Emergency Room for complaints of abdominal pain initially left sided but now can feel in the right as well, and back pain that radiates down to her legs.  Notes pain is worsened by fetal movement.  Onset of pain was yesterday morning.  Has attempted heating pads, cool compresses, stretching, and Tylenol without relief. Of note, while patient was in the Emergency Room, she had an ultrasound performed which reported "abdominal congenital anomalies", abdominal circumference measuring 2 weeks behind. Were concerned that patient may need intervention, and [facility] She was treated in the ER with 2 doses of Fentanyl which only offered short term modest relief. Denies vaginal bleeding, leakage of fluids, contractions, or decreased fetal movement.    Of note, [place] brother and mother tested positive for COVID yesterday.  Notes that she did not want to be around them while they were sick so [place].  States she was tested yesterday and was negative.  Lastly, patient complains of fainting spells over the past 2 weeks. Notes 2 episodes of feeling warm and flushed then syncopal. At one time she notes her face hit the wall.  Neither time does she endorse abdominal trauma after falling. Has recently seen a Cardiologist and is wearing a Holter monitor. She reports a headache that has been ongoing for several days that is in the left occipital  region, and feels pressure behind her left eye. Notes at one time having blurred vision in the eye just prior to her syncopal episode, but denies vision changes currently. She notes swelling in her feet today, but does admit that she forgot to wear her compression stockings today.    OB History  Gravida Para Term Preterm AB Living  _0 SAB TAB Ectopic Multiple Live Births        0 2    # Outcome Date GA Lbr Len/2nd Weight Sex Delivery Anes PTL Lv  3 Current           2 Term 05/05/19 [id number] g M CS-LTranv Spinal  LIV  1 Term 11/19/17    F CS-LTranv   LIV     Complications: Fetal Intolerance    Patient Active Problem List   Diagnosis Date Noted  . Chronic hypertension in pregnancy 12/17/2019  . UTI (urinary tract infection) during pregnancy 09/14/2019  . Back pain affecting pregnancy in third trimester 09/14/2019  . Low TSH level 09/09/2019  . Chest pain 08/30/2019  . Chronic hypertension with superimposed preeclampsia 05/01/2019  . Supervision of high risk pregnancy, antepartum 01/16/2019  . Diabetes mellitus complicating pregnancy 0140/98/1191. 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 Tachycardia 01/09/2019  . Mixed hyperlipidemia 01/09/2019  . Hyperthyroidism affecting pregnancy in second trimester 01/07/2019  . Lead exposure 12/06/2018  . Smoker 12/06/2018  . Bipolar 2 disorder ([place]/06/[place]  .  History of anxiety 12/06/2018  . Mild intermittent asthma without complication 84/66/5993  . Type 2 diabetes mellitus without complication, without long-term current use of insulin ([place]) 12/06/2018  . High-risk pregnancy in second trimester 12/06/2018  . History of heart attack 12/06/2018  . Graves disease 10/30/2018  . History of cesarean section 10/30/2018  . Nausea/vomiting in pregnancy 10/30/2018  . History of marijuana use 10/30/2018  . HYPERCHOLESTEROLEMIA 12/08/2009  . HIDRADENITIS SUPPURATIVA 12/08/2009  .  VITAMIN D DEFICIENCY 11/18/2009  . GERD 11/18/2009  . MENORRHAGIA 11/18/2009  . Diabetes mellitus type 2 with complications ([place]) 57/01/7792  . HYPERTENSION 11/17/2009  . ASTHMA 11/17/2009    Past Medical History:  Diagnosis Date  . Anemia   . Anxiety   . Asthma    "grew out' not since teenager  . Bipolar 1 disorder ([employer])   . Depression   . Diabetes mellitus    not currently on meds, normal A1c  . Hypertension   . Hyperthyroidism   . Infection    UTI  . MI (myocardial infarction) ([place]) 2017  . Neuropathy   . Syncope   . Tachycardia   . Thyroid disease     No current facility-administered medications on file prior to encounter.   Current Outpatient Medications on File Prior to Encounter  Medication Sig Dispense Refill  . DICLEGIS 10-10 MG TBEC Take 10 mg by mouth 4 (four) times daily. 60 tablet 1  . EPINEPHrine 0.3 mg/0.3 mL IJ SOAJ injection Inject 0.3 mg into the muscle as needed.     Marland Kitchen levothyroxine (SYNTHROID) 50 MCG tablet Take 1 tablet (50 mcg total) by mouth daily before breakfast. 30 tablet 1  . nitrofurantoin, macrocrystal-monohydrate, (MACROBID) 100 MG capsule Take 1 capsule (100 mg total) by mouth 2 (two) times daily. (Patient not taking: Reported on 01/20/2020) 14 capsule 0  . phenazopyridine (PYRIDIUM) 200 MG tablet Take 1 tablet (200 mg total) by mouth 3 (three) times daily. 6 tablet 0  . Prenatal Vit-Fe Fumarate-FA (PRENATAL MULTIVITAMIN) TABS tablet Take 1 tablet by mouth daily at 12 noon. 30 tablet 0  . promethazine (PHENERGAN) 25 MG tablet Take 1 tablet (25 mg total) by mouth every 6 (six) hours as needed for nausea or vomiting. (Patient not taking: Reported on 09/09/2019) 30 tablet 1  . pyridOXINE (B-6) 50 MG tablet Take 50 mg by mouth daily.     Marland Kitchen terconazole (TERAZOL 7) 0.4 % vaginal cream Place 1 applicator vaginally at bedtime. 45 g 0    Allergies  Allergen Reactions  . Darvocet [Propoxyphene N-Acetaminophen] Anaphylaxis    Also makes her stomach hurt   . Peanut-Containing Drug Products Shortness Of Breath and Swelling  . Penicillins Shortness Of Breath and Swelling    Did it involve swelling of the face/tongue/throat, SOB, or low BP? Yes Did it involve sudden or severe rash/hives, skin peeling, or any reaction on the inside of your mouth or nose? No Did you need to seek medical attention at a hospital or doctor's office? Yes When did it last happen?2007 If all above answers are "NO", may proceed with cephalosporin use.   . Cephalexin Hives and Itching  . Tramadol Nausea And Vomiting  . Toradol [Ketorolac Tromethamine] Rash    Red rash with bumps     ROS:  Review of Systems - Negative except what is noted in HPI.    Physical Exam:  Blood pressure 123/81, pulse 84, temperature 97.7 F (36.5 C), temperature source Oral, resp. rate 16, last menstrual  period 06/25/2019, unknown if currently breastfeeding.  General appearance: alert and mild distress Abdomen: soft, non-tender; bowel sounds normal; no masses,  no organomegaly. No rebound or guarding. Gravid uterus.  Back: point tenderness along sacral midline and to the right. No CVA tenderness. Symmetric.  Pelvic: deferred Extremities: extremities normal, atraumatic, no cyanosis or edema  Neurologic: Grossly intact.  Pain illicited on dorsiflexion and hyperextension of the feet, but negative straight leg test.     NST INTERPRETATION: Indications: rule out uterine contractions  Mode: External Baseline Rate (A): 145 bpm(fht) Variability: Moderate Accelerations: 15 x 15 Decelerations: None     Contraction Frequency (min): UI  Impression: reactive    Labs (Reviewed from [facility] 01/29/20):  O+/-.  CBC: 9.3>10.1/30.4<206 CMP: NA-137.  K-3.8.  Ca-8.4.  Cr-0.44.  AST-12.  ALT-8. Alk phos 220.  UA: 2+ LE, 10-20 WBCs, bacteria moderate, few epithelial cells.    Imaging (Reviewed from [facility] 01/29/20):  SIUP, limited evaluation of fetus. FHR 122 bpm, cephalic,  AFI 48.2 cm.  EGA 31.6 weeks.  Congenital fetal anomalies identified. Abdominal circumference is 2 weeks lower than expected suggesting possible intrauterine growth retardation. EFW 1627 grams (3 lb 9 oz). Placenta posterior. No previa. Ovaries were not visualized. No adnexal abnormality.    Assessment:  34 y.o. G3P2002 at 51w2dwith:  1.  Back pain/pelvic pain with sciatica 2. Fainting spells 3. Recent COVID exposure 4. Abnormal findings on ultrasound 5. Multiple comorbidites    Plan:  1. Back pain/pelvic pain with likely sciatica - discussed with patient likely diagnosis, especially with current fetal positioning (cephalic, on [place], head is engaged in the pelvis). Discussed different stretches, will give dose of Percocet and lidocaine patches to the back. Continuous fetal monitoring notes active fetus, no signs of distress. Only 1 contraction noted thus far, was not detectable by patient. Patient does also report battling a recurrent UTI since October. Review of labs notes several nitrite positive urine tests in the clinic. [facility] but still with some bacteria. Patient recently completed a course of antibiotics. Patient needs suppression therapy with Macrobid as she has had 3 UTI's in the pregnancy.  2. Fainting spells - currently undergoing workup by Cardiology but may likely be due to patient's BPs. She has a history of cHTN, however patient notes that in triage in the ER, her BPs were in the 110s/70s.  Notes her Cardiologist encouraged her to intake more salt/carbs recently as well.  Patient more than likely at BPs that are less tolerable for her in the setting of her history of cHTN.  Not currently on any meds. Advised also on increasing sodium intake. Is taking in adequate water. Continue to f/u with Cardiology recomendations.  3. Recent COVID exposure - patient notes recently testing negative for COVID, however her exposure  was only ~ 1 day ago.  Does not require retesting at this time (however may have been to early to be a true negative), is currently asymptomatic. Will continue routine COVID precautions.  4. Abnormal findings on ultrasound - Ultrasound report as well as images reviewed (on disc), no obvious fetal abnormalities noted of the abdomen besides abdominal circumference measurement.  Assuming that this is the congenital anomaly present. Patient also with early prenatal care, and recent anatomy scan noting normal anatomy. Discussed with patient that fetal growth overall is still measuring well and is not a concern. Does not require any intervention at this time. Given reassurance.  5. Patient with multiple comorbidities. Will need f/u in office for thyroid disease, started on Synthroid ~ 1 month ago.  All other comorbidities currently managed without medications.    Addendum, 01/30/2020 10:15 AM:  Patient observed overnight, notes that back pain has improved some with medication, was able to rest overnight.  Still occasionally noting headache. Advised on follow up eye exam as patient has had issues with blurred vision of left eye during the pregnancy in light of h/o cHTN, DM. Fetal status remains reassuring. Can d/c home. Will prescribe short course of pain meds (Percocet), advised on stretches. Will get urgent referral to Chiropractor/Physical Therapy for sciatica. Discussed that abdominal pain may be intermittent due to fetal movement and prior C-section scar. Will also prescribe Diflucan for patient as she notes recent antibiotic treatment for UTI and having vulvar irritative symptoms, taking OTC cream but not helping.  Lastly, she will need Macrobid suppression for the remainder of the pregnancy. She has an appointment in the office on 02/02/2020. Will also work to get ultrasound on same day.    Rubie Maid, MD [facility]

## 2020-01-31 ENCOUNTER — Encounter: Payer: Self-pay | Admitting: Obstetrics and Gynecology

## 2020-01-31 ENCOUNTER — Other Ambulatory Visit: Payer: Self-pay

## 2020-01-31 NOTE — OB Triage Note (Signed)
Pt. presented to L/D triage via EMS from hospital in [place], [place] per MD request. Her ultrasound today showed "fetal abnormalities", so she is following up with her OB to determine any issues. She reports a constant pain in her left leg and lower back, rated 6/10. Last dose of pain medication given around 2130 before arrival. She also describes a posterior constant headache since yesterday, rated 7/10. She reports no bleeding or LOF. No urinary pain, but she does report a UTI since September, treated with Doxycycline (finished a few days prior). Positive fetal movement. VSS. 20 gauge IV in right AC placed prior to arrival. Will continue to monitor.

## 2020-02-01 ENCOUNTER — Telehealth: Payer: Self-pay | Admitting: Cardiovascular Disease

## 2020-02-01 ENCOUNTER — Other Ambulatory Visit: Payer: Self-pay | Admitting: Obstetrics and Gynecology

## 2020-02-01 DIAGNOSIS — M5440 Lumbago with sciatica, unspecified side: Secondary | ICD-10-CM

## 2020-02-01 NOTE — Telephone Encounter (Signed)
[employer] calling in stating they are unable to get in touch with patient. They have billing questions and some other items they need to go over. The monitor shows active. [employer] has reached their call limit for the patient  Please advise

## 2020-02-01 NOTE — Telephone Encounter (Signed)
Spoke with [facility] at [place] and she states that they had reached limit of calls for just a Welcome call to patient. No further needs at this time and patient is wearing AT monitor. She was appreciative for the call but did not need any other information at this time.

## 2020-02-02 ENCOUNTER — Encounter: Payer: Self-pay | Admitting: Obstetrics and Gynecology

## 2020-02-02 ENCOUNTER — Other Ambulatory Visit: Payer: Self-pay

## 2020-02-02 ENCOUNTER — Ambulatory Visit (INDEPENDENT_AMBULATORY_CARE_PROVIDER_SITE_OTHER): Payer: Medicaid Other

## 2020-02-02 ENCOUNTER — Ambulatory Visit (INDEPENDENT_AMBULATORY_CARE_PROVIDER_SITE_OTHER): Payer: Medicaid Other | Admitting: Obstetrics and Gynecology

## 2020-02-02 VITALS — BP 113/75 | HR 89 | Wt 196.0 lb

## 2020-02-02 DIAGNOSIS — O0992 Supervision of high risk pregnancy, unspecified, second trimester: Secondary | ICD-10-CM

## 2020-02-02 DIAGNOSIS — O10919 Unspecified pre-existing hypertension complicating pregnancy, unspecified trimester: Secondary | ICD-10-CM

## 2020-02-02 DIAGNOSIS — O0993 Supervision of high risk pregnancy, unspecified, third trimester: Secondary | ICD-10-CM

## 2020-02-02 DIAGNOSIS — O10913 Unspecified pre-existing hypertension complicating pregnancy, third trimester: Secondary | ICD-10-CM

## 2020-02-02 DIAGNOSIS — E0500 Thyrotoxicosis with diffuse goiter without thyrotoxic crisis or storm: Secondary | ICD-10-CM

## 2020-02-02 DIAGNOSIS — E0800 Diabetes mellitus due to underlying condition with hyperosmolarity without nonketotic hyperglycemic-hyperosmolar coma (NKHHC): Secondary | ICD-10-CM

## 2020-02-02 DIAGNOSIS — H547 Unspecified visual loss: Secondary | ICD-10-CM

## 2020-02-02 DIAGNOSIS — Z3A31 31 weeks gestation of pregnancy: Secondary | ICD-10-CM

## 2020-02-02 DIAGNOSIS — I252 Old myocardial infarction: Secondary | ICD-10-CM

## 2020-02-02 DIAGNOSIS — T361X5A Adverse effect of cephalosporins and other beta-lactam antibiotics, initial encounter: Secondary | ICD-10-CM

## 2020-02-02 DIAGNOSIS — O2343 Unspecified infection of urinary tract in pregnancy, third trimester: Secondary | ICD-10-CM | POA: Diagnosis not present

## 2020-02-02 DIAGNOSIS — Z98891 History of uterine scar from previous surgery: Secondary | ICD-10-CM

## 2020-02-02 DIAGNOSIS — E119 Type 2 diabetes mellitus without complications: Secondary | ICD-10-CM

## 2020-02-02 LAB — POCT URINALYSIS DIPSTICK OB
Bilirubin, UA: NEGATIVE
Blood, UA: NEGATIVE
Glucose, UA: NEGATIVE
Ketones, UA: NEGATIVE
POC,PROTEIN,UA: NEGATIVE
Spec Grav, UA: 1.015 (ref 1.010–1.025)
Urobilinogen, UA: 0.2 E.U./dL
pH, UA: 6.5 (ref 5.0–8.0)

## 2020-02-02 MED ORDER — CEFTRIAXONE SODIUM 1 G IJ SOLR
1.0000 g | Freq: Once | INTRAMUSCULAR | Status: AC
  Administered 2020-02-02: 1 g via INTRAMUSCULAR

## 2020-02-02 MED ORDER — DIPHENHYDRAMINE HCL 50 MG/ML IJ SOLN
25.0000 mg | Freq: Once | INTRAMUSCULAR | Status: AC
  Administered 2020-02-02: 25 mg via INTRAMUSCULAR

## 2020-02-02 NOTE — Progress Notes (Signed)
[name]: Patient seen in triage over the weekend for abdominal pain/back pain and was transferred from a hospital in [place] for abnormal ultrasound findings (although ultrasound reviewed with no abnormalities seen). Also still noting issues with visual changes in her left eye. Notes headache comes and goes. BPs not elevated, has h/o DM but recent A1c normal, no meds. H/o Graves, will recheck thyroid levels. 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 Prescribed Macrobid for suppression therapy. Advised patient to f/u with Ophthalmologist ASAP due to visual changes. Has f/u appt with Cardiologist tomorrow. Referral also placed to physical therapy for sciatica. Discussion had on planning for C-section. Will schedule for 39 weeks unless otherwise indicated. To begin weekly NSTs beginning next week.

## 2020-02-02 NOTE — Lactation Note (Signed)
Lactation Consultation Note  Patient Name: Michelle Osborn [id number] Date: 02/02/2020     Maternal Data    Feeding    LATCH Score                   Interventions    Lactation Tools Discussed/Used     Consult Status   Lactation student discussed benefits of breastfeeding per the Ready, Set, Baby curriculum. Neldon Mc encouraged to review breastfeeding information on Ready, set, [employer] site and given information for virtual breastfeeding classes.    Arlyss Gandy 02/02/2020, 3:47 PM

## 2020-02-02 NOTE — Progress Notes (Signed)
[name]-Pt present for routine prenatal care. Pt stated that she was still having UTI symptoms. Pt stated having pressure and pain throughout the day.

## 2020-02-02 NOTE — Lactation Note (Signed)
Lactation Consultation Note  Patient Name: Michelle Osborn [id number] Date: 02/02/2020     Maternal Data    Feeding    LATCH Score                   Interventions    Lactation Tools Discussed/Used     Consult Status        Willa Rough Willet Schleifer 02/02/2020, 3:42 PM

## 2020-02-03 LAB — THYROID PANEL WITH TSH
Free Thyroxine Index: 1.8 (ref 1.2–4.9)
T3 Uptake Ratio: 15 % — ABNORMAL LOW (ref 24–39)
T4, Total: 11.7 ug/dL (ref 4.5–12.0)
TSH: <0.005 u[IU]/mL — ABNORMAL LOW (ref 0.450–4.500)

## 2020-02-05 LAB — URINE CULTURE

## 2020-02-08 ENCOUNTER — Other Ambulatory Visit: Payer: Self-pay

## 2020-02-08 ENCOUNTER — Telehealth: Payer: Self-pay

## 2020-02-08 NOTE — Telephone Encounter (Signed)
Pt called no answer LM via VM to call the office to go over test results. Sent pt a [facility] geologist.

## 2020-02-10 ENCOUNTER — Other Ambulatory Visit: Payer: Medicaid Other

## 2020-02-10 ENCOUNTER — Other Ambulatory Visit: Payer: Self-pay

## 2020-02-10 MED ORDER — SULFAMETHOXAZOLE-TRIMETHOPRIM 800-160 MG PO TABS: 1.0000 | ORAL_TABLET | Freq: Two times a day (BID) | ORAL | 0 refills | Status: DC

## 2020-02-16 ENCOUNTER — Encounter: Payer: Self-pay | Admitting: Obstetrics and Gynecology

## 2020-02-16 ENCOUNTER — Other Ambulatory Visit: Payer: Self-pay

## 2020-02-16 ENCOUNTER — Ambulatory Visit (INDEPENDENT_AMBULATORY_CARE_PROVIDER_SITE_OTHER): Payer: Medicaid Other | Admitting: Obstetrics and Gynecology

## 2020-02-16 VITALS — BP 117/81 | HR 86 | Wt 197.5 lb

## 2020-02-16 DIAGNOSIS — O0993 Supervision of high risk pregnancy, unspecified, third trimester: Secondary | ICD-10-CM

## 2020-02-16 LAB — POCT URINALYSIS DIPSTICK OB
Bilirubin, UA: NEGATIVE
Blood, UA: NEGATIVE
Glucose, UA: NEGATIVE
Ketones, UA: NEGATIVE
Leukocytes, UA: NEGATIVE
Nitrite, UA: NEGATIVE
POC,PROTEIN,UA: NEGATIVE
Spec Grav, UA: 1.01 (ref 1.010–1.025)
Urobilinogen, UA: 0.2 E.U./dL
pH, UA: 6.5 (ref 5.0–8.0)

## 2020-02-16 NOTE — Progress Notes (Signed)
[name]: Patient denies any further syncopal episodes but "I have been resting a lot and I am afraid to do too much".  Patient has not yet made an appointment for ophthalmology.  Appointment with cardiology scheduled as a virtual visit on 2/24.  Thyroid panel today.  Begin NSTs next week.

## 2020-02-16 NOTE — Addendum Note (Signed)
Addended by: Bryna Colander on: 02/16/2020 03:21 PM   Modules accepted: Orders

## 2020-02-17 LAB — THYROID PANEL WITH TSH
Free Thyroxine Index: 1.6 (ref 1.2–4.9)
T3 Uptake Ratio: 15 % — ABNORMAL LOW (ref 24–39)
T4, Total: 10.5 ug/dL (ref 4.5–12.0)
TSH: <0.005 u[IU]/mL — ABNORMAL LOW (ref 0.450–4.500)

## 2020-02-23 ENCOUNTER — Encounter: Payer: Self-pay | Admitting: Obstetrics and Gynecology

## 2020-02-23 ENCOUNTER — Ambulatory Visit (INDEPENDENT_AMBULATORY_CARE_PROVIDER_SITE_OTHER): Payer: Medicaid Other | Admitting: Obstetrics and Gynecology

## 2020-02-23 ENCOUNTER — Other Ambulatory Visit: Payer: Self-pay

## 2020-02-23 ENCOUNTER — Telehealth: Payer: Self-pay

## 2020-02-23 ENCOUNTER — Other Ambulatory Visit: Payer: Medicaid Other

## 2020-02-23 DIAGNOSIS — O10919 Unspecified pre-existing hypertension complicating pregnancy, unspecified trimester: Secondary | ICD-10-CM

## 2020-02-23 DIAGNOSIS — Z98891 History of uterine scar from previous surgery: Secondary | ICD-10-CM

## 2020-02-23 DIAGNOSIS — O26893 Other specified pregnancy related conditions, third trimester: Secondary | ICD-10-CM

## 2020-02-23 DIAGNOSIS — O0993 Supervision of high risk pregnancy, unspecified, third trimester: Secondary | ICD-10-CM | POA: Diagnosis not present

## 2020-02-23 DIAGNOSIS — O24913 Unspecified diabetes mellitus in pregnancy, third trimester: Secondary | ICD-10-CM

## 2020-02-23 DIAGNOSIS — K2101 Gastro-esophageal reflux disease with esophagitis, with bleeding: Secondary | ICD-10-CM

## 2020-02-23 DIAGNOSIS — R109 Unspecified abdominal pain: Secondary | ICD-10-CM

## 2020-02-23 DIAGNOSIS — E0500 Thyrotoxicosis with diffuse goiter without thyrotoxic crisis or storm: Secondary | ICD-10-CM

## 2020-02-23 DIAGNOSIS — O368130 Decreased fetal movements, third trimester, not applicable or unspecified: Secondary | ICD-10-CM

## 2020-02-23 LAB — POCT URINALYSIS DIPSTICK OB
Bilirubin, UA: NEGATIVE
Blood, UA: NEGATIVE
Glucose, UA: NEGATIVE
Ketones, UA: NEGATIVE
Nitrite, UA: NEGATIVE
Spec Grav, UA: 1.01 (ref 1.010–1.025)
Urobilinogen, UA: 0.2 E.U./dL
pH, UA: 7 (ref 5.0–8.0)

## 2020-02-23 MED ORDER — EPINEPHRINE 0.3 MG/0.3ML IJ SOAJ: 0.3000 mg | INTRAMUSCULAR | 6 refills | Status: DC | PRN

## 2020-02-23 MED ORDER — PANTOPRAZOLE SODIUM 20 MG PO TBEC: 20.0000 mg | DELAYED_RELEASE_TABLET | Freq: Two times a day (BID) | ORAL | 3 refills | Status: DC

## 2020-02-23 NOTE — Progress Notes (Deleted)
Virtual Visit via Video Note   This visit type was conducted due to national recommendations for restrictions regarding the COVID-19 Pandemic (e.g. social distancing) in an effort to limit this patient's exposure and mitigate transmission in our community.  Due to her co-morbid illnesses, this patient is at least at moderate risk for complications without adequate follow up.  This format is felt to be most appropriate for this patient at this time.  All issues noted in this document were discussed and addressed.  A limited physical exam was performed with this format.  Please refer to the patient's chart for her consent to telehealth for [facility].   I connected with  Michelle Osborn on 02/23/20 by a video enabled telemedicine application and verified that I am speaking with the correct person using two identifiers. I discussed the limitations of evaluation and management by telemedicine. The patient expressed understanding and agreed to proceed.   Evaluation Performed:  Follow-up visit  Date:  02/23/2020   ID:  Michelle Osborn, DOB 1986-07-26, MRN [id number]  Patient Location:  [place] [address]   Provider location:   [place] Captain, [employer] office  PCP:  Inc, [facility]  Cardiologist:  Rockey Situ, [facility]   No chief complaint on file.    History of Present Illness:    Michelle Osborn is a 34 y.o. female who presents via audio/video conferencing for a telehealth visit today.   The patient does not symptoms concerning for COVID-19 infection (fever, chills, cough, or new SHORTNESS OF BREATH).   Patient has a past medical history of Graves' disease h/o DM2, previously insulin dependent.  Smoking  Coronary artery disease NSTEMI with stents,  [place], s/p DES to LAD and D1 03/2016  HTN,  PCOS (polycystic ovarian syndrome)  currently pregnant Who presents for f/u of her coronary artery disease    Currently in her second  trimester  She reports having multiple episodes of syncope/near syncope LOC in 11/2019, was seen in the emergency room Records reviewed, was told blood pressure borderline low  Another episode was in a grocery store with her mom when she felt hot and passed out. Reports having second episode again while in a grocery store [place] down to the ground, near syncope/syncope  Past Thursday, Trying to get to sofa, walked into wall, Feels the trauma from walking into the wall woke her up Was able to recover  Last Tuesday,event   Saturday, had an event  Last Friday Flipped over cart, at grocery store Could hear, but could not see  Times before his episode she feels hot, woozy Sometimes no warning All of them while she is standing quickly  Sugars ok when she is checked In recovery BP ok Does not have blood pressures when she is having an event  Seen by evans yesterday, 107/70  Moderate leukocytes on urine yesterday Lab work December normal renal function, glucose was low 72 Electrolytes were stable  Denies any anginal symptoms at baseline, no exertional chest discomfort or shortness of breath   The past medical history reviewed  Previously followed by cardiologist September 2017 [facility] [facility] Reported developing chest pain 2017, was in and out of the emergency room several times but was given anxiety medications Reports that she went to a different hospital, they checked cardiac enzymes which were noted to be elevated, taken to cardiac catheterization lab and had a stent placed Stent was placed to the LAD and diagonal vessel  He does report having a " mediastinal mass that was found approximately 2 years ago on Lexiscan stress imaging" This could not be verified through the records  [facility] April 26, 2016 Non-STEMI Resolute drug-eluting stent placed to mid LAD Resolute 2.25 x 14 mm DES placed to large diagonal Nonobstructive disease in the RCA and left  circumflex  Strong family history of early coronary disease, mother died age 16 with heart attack, uncle died age 84 MI   Prior CV studies:   The following studies were reviewed today:    Past Medical History:  Diagnosis Date  . Anemia   . Anxiety   . Asthma    "grew out' not since teenager  . Bipolar 1 disorder (HCC)   . Depression   . Diabetes mellitus    not currently on meds, normal A1c  . Hypertension   . Hyperthyroidism   . Infection    UTI  . MI (myocardial infarction) (HCC) 2017  . Neuropathy   . Syncope   . Tachycardia   . Thyroid disease    Past Surgical History:  Procedure Laterality Date  . ADENOIDECTOMY    . APPENDECTOMY    . CARDIAC CATHETERIZATION  03/2016   [place], [place]   . CESAREAN SECTION    . CESAREAN SECTION N/A 05/05/2019   Procedure: REPEAT CESAREAN SECTION;  Surgeon: Linzie Collin, MD;  Location: [facility] ORS;  Service: Obstetrics;  Laterality: N/A;  . CORONARY ANGIOPLASTY WITH STENT PLACEMENT  03/2016  . DILATION AND CURETTAGE OF UTERUS    . OTHER SURGICAL HISTORY     sweat gland excision  . TONSILLECTOMY        Allergies:   Darvocet [propoxyphene n-acetaminophen], Peanut-containing drug products, Penicillins, Cephalexin, Tramadol, and Toradol [ketorolac tromethamine]   Social History   Tobacco Use  . Smoking status: Current Every Day Smoker    Packs/day: 0.25    Years: 10.00    Pack years: 2.50    Types: Cigarettes  . Smokeless tobacco: Never Used  Substance Use Topics  . Alcohol use: No  . Drug use: Yes    Types: Marijuana    Comment: last was in Aug, prior to move to shelter     Current Outpatient Medications on File Prior to Visit  Medication Sig Dispense Refill  . DICLEGIS 10-10 MG TBEC Take 10 mg by mouth 4 (four) times daily. 60 tablet 1  . EPINEPHrine 0.3 mg/0.3 mL IJ SOAJ injection Inject 0.3 mg into the muscle as needed.     Marland Kitchen levothyroxine (SYNTHROID) 50 MCG tablet Take 1 tablet (50 mcg total) by mouth daily before  breakfast. 30 tablet 1  . nitrofurantoin, macrocrystal-monohydrate, (MACROBID) 100 MG capsule Take 1 capsule (100 mg total) by mouth daily. 60 capsule 2  . oxyCODONE-acetaminophen (PERCOCET) 5-325 MG tablet Take 1 tablet by mouth every 4 (four) hours as needed for severe pain. 20 tablet 0  . Prenatal Vit-Fe Fumarate-FA (PRENATAL MULTIVITAMIN) TABS tablet Take 1 tablet by mouth daily at 12 noon. 30 tablet 0  . promethazine (PHENERGAN) 25 MG tablet Take 1 tablet (25 mg total) by mouth every 6 (six) hours as needed for nausea or vomiting. 30 tablet 1  . sulfamethoxazole-trimethoprim (BACTRIM DS) 800-160 MG tablet Take 1 tablet by mouth 2 (two) times daily. 14 tablet 0   No current facility-administered medications on file prior to visit.     Family Hx: The patient's family history includes Asthma in her mother; Cancer in her maternal  aunt and maternal grandmother; Diabetes in her mother; Heart disease in her father and mother; Heart failure in her mother; Hyperlipidemia in her mother; Hypertension in her father and mother; Kidney disease in her father.  ROS:   Please see the history of present illness.    Review of Systems  Constitutional: Negative.   HENT: Negative.   Respiratory: Negative.   Cardiovascular: Negative.   Gastrointestinal: Negative.   Musculoskeletal: Negative.   Neurological: Positive for loss of consciousness.  Psychiatric/Behavioral: Negative.   All other systems reviewed and are negative.     Labs/Other Tests and Data Reviewed:    Recent Labs: 11/01/2019: Hemoglobin 11.0; Platelets 232 12/17/2019: ALT 5; BUN 5; Creatinine, Ser 0.40; Potassium 4.3; Sodium 140 02/16/2020: TSH <0.005   Recent Lipid Panel Lab Results  Component Value Date/Time   CHOL 176 11/18/2009 10:37 PM   TRIG 62 11/18/2009 10:37 PM   HDL 37 (L) 11/18/2009 10:37 PM   CHOLHDL 4.8 Ratio 11/18/2009 10:37 PM   LDLCALC 127 (H) 11/18/2009 10:37 PM    Wt Readings from Last 3 Encounters:  02/16/20  197 lb 8 oz (89.6 kg)  02/02/20 196 lb (88.9 kg)  01/30/20 194 lb (88 kg)     Exam:    Vital Signs: Vital signs may also be detailed in the HPI LMP 06/25/2019   Wt Readings from Last 3 Encounters:  02/16/20 197 lb 8 oz (89.6 kg)  02/02/20 196 lb (88.9 kg)  01/30/20 194 lb (88 kg)   Temp Readings from Last 3 Encounters:  01/30/20 97.9 F (36.6 C) (Oral)  11/01/19 98.7 F (37.1 C) (Oral)  09/17/19 98.6 F (37 C) (Oral)   BP Readings from Last 3 Encounters:  02/16/20 117/81  02/02/20 113/75  01/30/20 113/64   Pulse Readings from Last 3 Encounters:  02/16/20 86  02/02/20 89  01/30/20 75     Well nourished, well developed female in no acute distress. Constitutional:  oriented to person, place, and time. No distress.  Head: Normocephalic and atraumatic.  Eyes:  no discharge. No scleral icterus.  Neck: Normal range of motion. Neck supple.  Pulmonary/Chest: No audible wheezing, no distress, appears comfortable Musculoskeletal: Normal range of motion.  no  tenderness or deformity.  Neurological:   Coordination normal. Full exam not performed Skin:  No rash Psychiatric:  normal mood and affect. behavior is normal. Thought content normal.    ASSESSMENT & PLAN:    Problem List Items Addressed This Visit    None     Syncope Etiology unclear, given prior MI and coronary disease there is concern for arrhythmia She did wear a ZIO monitor last year for palpitation symptoms but this was presumably lost in the mail and we never received data --Sometimes warning of dizziness, feeling hot, wooziness Unclear if she is orthostatic versus cardiac arrhythmia Will reorder ZIO monitor with close monitoring -Recommend she is trying to stay hydrated, avoid low glucose, may need to add little bit of salt given low blood pressure -Try to borrow blood pressure cuff oh by 1 so she can check orthostatics at home Recommend she not drive  Hyperlipidemia Does not appear to be on a statin at  this time We will discussed with her following delivery of her child  Cad with stable angina She denies any anginal symptoms, able to exert without chest pain shortness of breath Syncope as detailed above most likely from ischemia though will need to consider further work-up if she continues to have symptoms Prior  echocardiogram several months ago normal ejection fraction    COVID-19 Education: The signs and symptoms of COVID-19 were discussed with the patient and how to seek care for testing (follow up with PCP or arrange E-visit).  The importance of social distancing was discussed today.  Patient Risk:   After full review of this patients clinical status, I feel that they are at least moderate risk at this time.  Time:   Today, I have spent 25 minutes with the patient with telehealth technology discussing the cardiac and medical problems/diagnoses detailed above   Additional 10 min spent reviewing the chart prior to patient visit today   Medication Adjustments/Labs and Tests Ordered: Current medicines are reviewed at length with the patient today.  Concerns regarding medicines are outlined above.   Tests Ordered: No tests ordered   Medication Changes: No changes made   Disposition: Follow-up in 1 month   Signed, Julien Nordmann, MD  [facility] [address], [place], [place] [address]

## 2020-02-23 NOTE — Progress Notes (Signed)
[name]-pt present for routine prenatal care and NST. Pt called this morning stating vomiting up brown/red colored that was not blood. Pt stated that she has not vomited since. Pt stated having abd pain no other complaints.

## 2020-02-23 NOTE — Progress Notes (Signed)
[name]: Patient complains of decreased fetal movement since Sunday.  Also reports vomiting up brown/red emesis, does not think it was blood. Last meal she ate was pizza (but reports it was without the sauce).  She continues to note pelvic pain and pressure. Has a pregnancy girdle but does not use regularly. Given prescription for Protonix for suspected reflux. NST performed today was reviewed and was found to be reactive.  She reports swelling in her feet over the past several days, but not bad today. Continue recommended antenatal testing and prenatal care. RTC in 2 weeks for OB visit. Continue weekly NSTs. Has Cardiology f/u appointment tomorrow.    NONSTRESS TEST INTERPRETATION  INDICATIONS: Decreased fetal movement, cHTN (no meds), Type II DM (no meds), Graves disease  FHR baseline: 160 bpm RESULTS:Reactive (with use of acoustic stimulator) COMMENTS: uterine irritability.  BPs on NST elevated (140s-160s/80s-100s) however manual BP 122/78.    PLAN: 1. Continue fetal kick counts twice a day. 2. Continue antepartum testing as scheduled-weekly       .

## 2020-02-23 NOTE — Telephone Encounter (Signed)
Pt called to check on pt due to call to the office c/o of throwing up brown/red, abd pain haven't felt baby since Sunday. Pt stated that she has not throw up since the call the office. Pt stated she still has not felt the baby and has not ate breakfast. Pt stated that she was drinking cold water and ice cubes and have been changing  Positions and nothing. Pt was advised to keep her appointment for NST and routine prenatal today at 2pm. Pt stated that she was.

## 2020-02-24 ENCOUNTER — Telehealth: Payer: Self-pay | Admitting: *Deleted

## 2020-02-24 ENCOUNTER — Telehealth: Payer: Medicaid Other | Admitting: Cardiovascular Disease

## 2020-02-24 NOTE — Telephone Encounter (Signed)
Left voicemail message to call back for results.  

## 2020-02-24 NOTE — Telephone Encounter (Signed)
-----   Message from Antonieta Iba, MD sent at 02/24/2020  7:58 AM EST ----- No significant arrhythmia that would contribute to near syncope or syncope Would stay hydrated Compression hose, thigh-high if needed for low blood pressure Would check orthostatics at home

## 2020-02-25 ENCOUNTER — Encounter: Payer: Self-pay | Admitting: Cardiovascular Disease

## 2020-02-25 NOTE — Telephone Encounter (Signed)
Left voicemail message to call back  

## 2020-02-29 DIAGNOSIS — R55 Syncope and collapse: Secondary | ICD-10-CM

## 2020-02-29 DIAGNOSIS — L819 Disorder of pigmentation, unspecified: Secondary | ICD-10-CM

## 2020-02-29 HISTORY — DX: Syncope and collapse: R55

## 2020-02-29 HISTORY — DX: Disorder of pigmentation, unspecified: L81.9

## 2020-03-01 ENCOUNTER — Encounter: Payer: Self-pay | Admitting: *Deleted

## 2020-03-01 NOTE — Telephone Encounter (Signed)
Patient has seen results through My Chart. Closing encounter.

## 2020-03-01 NOTE — Telephone Encounter (Signed)
Left voicemail message.

## 2020-03-02 ENCOUNTER — Ambulatory Visit: Payer: Medicaid Other

## 2020-03-02 ENCOUNTER — Other Ambulatory Visit: Payer: Medicaid Other

## 2020-03-04 ENCOUNTER — Telehealth: Payer: Self-pay

## 2020-03-04 ENCOUNTER — Telehealth: Payer: Self-pay | Admitting: Obstetrics and Gynecology

## 2020-03-04 NOTE — Telephone Encounter (Signed)
Please see previous mychart messages regarding hemorrhoids.

## 2020-03-04 NOTE — Telephone Encounter (Signed)
Patient called saying she is experiencing what she thinks is hemmroids. She would like advice on what to do about them.  Could you please advice patient on what to do to relieve her pain.  -TC

## 2020-03-04 NOTE — Telephone Encounter (Signed)
mychart message sent to patient regarding hemorrhoids.

## 2020-03-07 ENCOUNTER — Telehealth: Payer: Self-pay | Admitting: Obstetrics and Gynecology

## 2020-03-07 NOTE — Telephone Encounter (Signed)
Spoke with patient and she has woke up with purple feet the last two morning. She denies pain or any other symptoms. She has been wearing the compression socks except to sleep in. I told her that I would call Dr. Logan Bores to discuss. She has appointment tomorrow. I told her if she started to have any pain to let us know.

## 2020-03-07 NOTE — Telephone Encounter (Signed)
Discussed with Dr. Logan Bores and he stated that it was fine to wait until tomorrow.

## 2020-03-07 NOTE — Telephone Encounter (Signed)
Pt called in and stated that her feet are purple when she wakes up. They don't hurt. But she is worried. The pt is requesting a call back. Please advise

## 2020-03-08 ENCOUNTER — Encounter: Payer: Self-pay | Admitting: Obstetrics and Gynecology

## 2020-03-08 ENCOUNTER — Ambulatory Visit (INDEPENDENT_AMBULATORY_CARE_PROVIDER_SITE_OTHER): Payer: Medicaid Other | Admitting: Obstetrics and Gynecology

## 2020-03-08 ENCOUNTER — Other Ambulatory Visit (INDEPENDENT_AMBULATORY_CARE_PROVIDER_SITE_OTHER): Payer: Medicaid Other

## 2020-03-08 ENCOUNTER — Other Ambulatory Visit: Payer: Self-pay

## 2020-03-08 ENCOUNTER — Other Ambulatory Visit: Payer: Medicaid Other

## 2020-03-08 VITALS — BP 117/75 | HR 99 | Wt 204.7 lb

## 2020-03-08 DIAGNOSIS — O0993 Supervision of high risk pregnancy, unspecified, third trimester: Secondary | ICD-10-CM

## 2020-03-08 DIAGNOSIS — O368130 Decreased fetal movements, third trimester, not applicable or unspecified: Secondary | ICD-10-CM | POA: Diagnosis not present

## 2020-03-08 DIAGNOSIS — O24913 Unspecified diabetes mellitus in pregnancy, third trimester: Secondary | ICD-10-CM | POA: Diagnosis not present

## 2020-03-08 DIAGNOSIS — Z3A36 36 weeks gestation of pregnancy: Secondary | ICD-10-CM

## 2020-03-08 LAB — POCT URINALYSIS DIPSTICK OB
Bilirubin, UA: NEGATIVE
Blood, UA: NEGATIVE
Glucose, UA: NEGATIVE
Ketones, UA: NEGATIVE
Leukocytes, UA: NEGATIVE
Nitrite, UA: NEGATIVE
Spec Grav, UA: 1.01 (ref 1.010–1.025)
Urobilinogen, UA: 0.2 E.U./dL
pH, UA: 7 (ref 5.0–8.0)

## 2020-03-08 NOTE — Progress Notes (Signed)
[name]: Has still not completed her follow-up with cardiology regarding Holter monitor.  States her feet are colored purple in the morning-asymptomatic.  Reports normal daily fetal movement.  Says she is taking her labetalol.  Says Protonix is working "sometimes".  She says she is keeping her sugars but did not bring her log or her meter today.  Reports that her highest sugar in the last week has been 126. NST today-no decelerations but no 15 x 15 accelerations.  Sent for BPP. 8/8

## 2020-03-08 NOTE — Progress Notes (Signed)
Patient comes in today for  [name] visit.  

## 2020-03-08 NOTE — Progress Notes (Signed)
Cx's done today

## 2020-03-11 NOTE — Telephone Encounter (Signed)
Spoke with patient and reviewed her monitor results. She had called in stating that her phone was now working and to call back. She verbalized understanding of results and recommendations with instructions to call back if any further concerns.

## 2020-03-12 LAB — GC/CHLAMYDIA PROBE AMP
Chlamydia trachomatis, NAA: NEGATIVE
Neisseria Gonorrhoeae by PCR: NEGATIVE

## 2020-03-15 NOTE — Progress Notes (Signed)
[name]-Pt present for NST and routine prenatal care. Pt stated having a lot of pain and pressure in the lower back.

## 2020-03-16 ENCOUNTER — Ambulatory Visit (INDEPENDENT_AMBULATORY_CARE_PROVIDER_SITE_OTHER): Payer: Medicaid Other | Admitting: Obstetrics and Gynecology

## 2020-03-16 ENCOUNTER — Other Ambulatory Visit: Payer: Medicaid Other

## 2020-03-16 ENCOUNTER — Other Ambulatory Visit: Payer: Self-pay

## 2020-03-16 ENCOUNTER — Encounter: Payer: Self-pay | Admitting: Obstetrics and Gynecology

## 2020-03-16 VITALS — BP 112/77 | HR 80 | Wt 207.0 lb

## 2020-03-16 DIAGNOSIS — E0500 Thyrotoxicosis with diffuse goiter without thyrotoxic crisis or storm: Secondary | ICD-10-CM

## 2020-03-16 DIAGNOSIS — Z3A37 37 weeks gestation of pregnancy: Secondary | ICD-10-CM

## 2020-03-16 DIAGNOSIS — I252 Old myocardial infarction: Secondary | ICD-10-CM

## 2020-03-16 DIAGNOSIS — O0993 Supervision of high risk pregnancy, unspecified, third trimester: Secondary | ICD-10-CM

## 2020-03-16 DIAGNOSIS — Z98891 History of uterine scar from previous surgery: Secondary | ICD-10-CM

## 2020-03-16 DIAGNOSIS — O24913 Unspecified diabetes mellitus in pregnancy, third trimester: Secondary | ICD-10-CM

## 2020-03-16 DIAGNOSIS — O10919 Unspecified pre-existing hypertension complicating pregnancy, unspecified trimester: Secondary | ICD-10-CM

## 2020-03-16 LAB — POCT URINALYSIS DIPSTICK OB
Bilirubin, UA: NEGATIVE
Blood, UA: NEGATIVE
Glucose, UA: NEGATIVE
Ketones, UA: NEGATIVE
Nitrite, UA: NEGATIVE
Spec Grav, UA: 1.015 (ref 1.010–1.025)
Urobilinogen, UA: 0.2 E.U./dL
pH, UA: 6.5 (ref 5.0–8.0)

## 2020-03-16 LAB — STREP GP B NAA+RFLX: Strep Gp B NAA+Rflx: POSITIVE — AB

## 2020-03-16 LAB — STREP GP B SUSCEPTIBILITY

## 2020-03-16 NOTE — Progress Notes (Signed)
[name]: Noting back pain. Ready for her pregnancy to be over.  Is scheduled for repeat C-section with BTL next week.  Discussed COVID testing and pre-operative protocol.  NST performed today was reviewed and was found to be reactive.  Continue recommended antenatal testing and prenatal care. RTC in 1 week.    NONSTRESS TEST INTERPRETATION  INDICATIONS: DM II (no meds), Grave's disease and Chronic hypertension (no meds)  FHR baseline: 150 bpm RESULTS:Reactive (with use of vibroacoustic stimulator) COMMENTS: uterine irritability with 1 contraction.    PLAN: 1. Continue fetal kick counts twice a day. 2. Continue antepartum testing as scheduled-weekly

## 2020-03-16 NOTE — Patient Instructions (Addendum)
COVID 19 Instructions for Scheduled Procedure (Inductions/C-sections and GYN surgeries)   Thank you for choosing [facility] for your services.  You have been scheduled for a procedure called ______Repeat C-section with Bilateral Tubal Ligation______.    Your procedure is scheduled on _____March 26, 2021______.  You are required to have COVID-19 testing performed 2 days prior to your scheduled procedure date.  Testing is performed between 9 AM to 4 PM Monday through Friday.  Please present for testing on ___Wednesday, March 24, 2021______ during this hour. Drive up testing is performed in front of [facility] (this is next to the [employer]).    Upon your scheduled procedure date, you will need to arrive at the [facility] entrance. (There is a statue at the front of this entrance.)   Please arrive on time if you are scheduled for an induction of labor.   If you are scheduled for a Cesarean delivery or for Gyn Surgery, arrive 2 hours prior to your procedure time.   If you are an Obstetric patient and your arrival time falls between 11 PM and 6 AM call L&D [phone]) when you arrive.  A staff member will meet you at the [facility] entrance.  At this time, patients are allowed 1 support person to accompany them. Face masks are required for you and your support person. Your support person is now allowed to be there with you during the entire time of your admission.   Please contact the office if you have any questions regarding this information.  The [facility] office number is [phone].     Thank you,    Your [facility] Providers       Cesarean Delivery Cesarean birth, or cesarean delivery, is the surgical delivery of a baby through an incision in the abdomen and the uterus. This may be referred to as a C-section. This procedure may be scheduled ahead of time, or it may be done in an emergency situation. Tell a health care provider  about:  Any allergies you have.  All medicines you are taking, including vitamins, herbs, eye drops, creams, and over-the-counter medicines.  Any problems you or family members have had with anesthetic medicines.  Any blood disorders you have.  Any surgeries you have had.  Any medical conditions you have.  Whether you or any members of your family have a history of deep vein thrombosis (DVT) or pulmonary embolism (PE). What are the risks? Generally, this is a safe procedure. However, problems may occur, including:  Infection.  Bleeding.  Allergic reactions to medicines.  Damage to other structures or organs.  Blood clots.  Injury to your baby. What happens before the procedure? General instructions  Follow instructions from your health care provider about eating or drinking restrictions.  If you know that you are going to have a cesarean delivery, do not shave your pubic area. Shaving before the procedure may increase your risk of infection.  Plan to have someone take you home from the hospital.  Ask your health care provider what steps will be taken to prevent infection. These may include: ? Removing hair at the surgery site. ? Washing skin with a germ-killing soap. ? Taking antibiotic medicine.  Depending on the reason for your cesarean delivery, you may have a physical exam or additional testing, such as an ultrasound.  You may have your blood or urine tested. Questions for your health care provider  Ask your health care provider about: ?  Changing or stopping your regular medicines. This is especially important if you are taking diabetes medicines or blood thinners. ? Your pain management plan. This is especially important if you plan to breastfeed your baby. ? How long you will be in the hospital after the procedure. ? Any concerns you may have about receiving blood products, if you need them during the procedure. ? Cord blood banking, if you plan to collect  your baby's umbilical cord blood.  You may also want to ask your health care provider: ? Whether you will be able to hold or breastfeed your baby while you are still in the operating room. ? Whether your baby can stay with you immediately after the procedure and during your recovery. ? Whether a family member or a person of your choice can go with you into the operating room and stay with you during the procedure, immediately after the procedure, and during your recovery. What happens during the procedure?   An IV will be inserted into one of your veins.  Fluid and medicines, such as antibiotics, will be given before the surgery.  Fetal monitors will be placed on your abdomen to check your baby's heart rate.  You may be given a special warming gown to wear to keep your temperature stable.  A catheter may be inserted into your bladder through your urethra. This drains your urine during the procedure.  You may be given one or more of the following: ? A medicine to numb the area (local anesthetic). ? A medicine to make you fall asleep (general anesthetic). ? A medicine (regional anesthetic) that is injected into your back or through a small thin tube placed in your back (spinal anesthetic or epidural anesthetic). This numbs everything below the injection site and allows you to stay awake during your procedure. If this makes you feel nauseous, tell your health care provider. Medicines will be available to help reduce any nausea you may feel.  An incision will be made in your abdomen, and then in your uterus.  If you are awake during your procedure, you may feel tugging and pulling in your abdomen, but you should not feel pain. If you feel pain, tell your health care provider immediately.  Your baby will be removed from your uterus. You may feel more pressure or pushing while this happens.  Immediately after birth, your baby will be dried and kept warm. You may be able to hold and breastfeed  your baby.  The umbilical cord may be clamped and cut during this time. This usually occurs after waiting a period of 1-2 minutes after delivery.  Your placenta will be removed from your uterus.  Your incisions will be closed with stitches (sutures). Staples, skin glue, or adhesive strips may also be applied to the incision in your abdomen.  Bandages (dressings) may be placed over the incision in your abdomen. The procedure may vary among health care providers and hospitals. What happens after the procedure?  Your blood pressure, heart rate, breathing rate, and blood oxygen level will be monitored until you are discharged from the hospital.  You may continue to receive fluids and medicines through an IV.  You will have some pain. Medicines will be available to help control your pain.  To help prevent blood clots: ? You may be given medicines. ? You may have to wear compression stockings or devices. ? You will be encouraged to walk around when you are able.  Hospital staff will encourage and support bonding  with your baby. Your hospital may have you and your baby to stay in the same room (rooming in) during your hospital stay to encourage successful bonding and breastfeeding.  You may be encouraged to cough and breathe deeply often. This helps to prevent lung problems.  If you have a catheter draining your urine, it will be removed as soon as possible after your procedure. Summary  Cesarean birth, or cesarean delivery, is the surgical delivery of a baby through an incision in the abdomen and the uterus.  Follow instructions from your health care provider about eating or drinking restrictions before the procedure.  You will have some pain after the procedure. Medicines will be available to help control your pain.  Hospital staff will encourage and support bonding with your baby after the procedure. Your hospital may have you and your baby to stay in the same room (rooming in) during  your hospital stay to encourage successful bonding and breastfeeding. This information is not intended to replace advice given to you by your health care provider. Make sure you discuss any questions you have with your health care provider. Document Revised: 06/23/2018 Document Reviewed: 06/23/2018 Elsevier Patient Education  2020 [employer].

## 2020-03-22 ENCOUNTER — Other Ambulatory Visit: Payer: Medicaid Other

## 2020-03-22 ENCOUNTER — Other Ambulatory Visit: Payer: Self-pay

## 2020-03-22 ENCOUNTER — Encounter: Payer: Medicaid Other | Admitting: Obstetrics and Gynecology

## 2020-03-22 ENCOUNTER — Encounter
Admission: RE | Admit: 2020-03-22 | Discharge: 2020-03-22 | Disposition: A | Discharge status: Home / Self Care | Payer: Medicaid Other | Source: Ambulatory Visit | Attending: Obstetrics and Gynecology | Admitting: Obstetrics and Gynecology

## 2020-03-22 DIAGNOSIS — Z01818 Encounter for other preprocedural examination: Secondary | ICD-10-CM | POA: Diagnosis present

## 2020-03-22 HISTORY — DX: Angina pectoris, unspecified: I20.9

## 2020-03-22 HISTORY — DX: Hidradenitis suppurativa: L73.2

## 2020-03-22 NOTE — Patient Instructions (Signed)
INSTRUCTIONS FOR SURGERY     Your BABY DAY is Friday, MARCH 26TH !!!       To find out your arrival time for the day of surgery,          please call [phone]  ON THE LABOR & DELIVERY UNIT   THE STAFF OF [facility] WILL TELL YOU HOW TO ENTER THE HOSPITAL.    REMEMBER: Instructions that are not followed completely may result in serious medical risk,  up to and including death, or upon the discretion of your surgeon and anesthesiologist,            your surgery may need to be rescheduled.  __X__ 1. Do not eat food after midnight the night before your procedure.                    No gum, candy, lozenger, tic tacs, tums or hard candies.                  ABSOLUTELY NOTHING SOLID IN YOUR MOUTH AFTER MIDNIGHT                    You may drink unlimited clear liquids up to 2 hours before you are scheduled to arrive for surgery.                   Do not drink anything within those 2 hours unless you need to take medicine, then take the                   smallest amount you need.  Clear liquids include:  water, apple juice without pulp,                   any flavor Gatorade, Black coffee, black tea.  Sugar may be added but no dairy/ honey /lemon.                        Broth and jello is not considered a clear liquid.  __x__  2. On the morning of surgery, please brush your teeth with toothpaste and water. You may rinse with                  mouthwash if you wish but DO NOT SWALLOW TOOTHPASTE OR MOUTHWASH  __X___3. NO alcohol for 24 hours before or after surgery.  __x___ 4.  Do NOT smoke or use e-cigarettes for 24 HOURS PRIOR TO SURGERY.                      DO NOT Use any chewable tobacco products for at least 6 hours prior to surgery.  __x___ 5. If you start any new medication after this appointment and prior to surgery, please                   Bring it with you on the day of surgery.  ___x__ 6. Notify your doctor if there is any change  in your medical condition, such as fever,  infection, vomitting, diarrhea or any open sores.  __x___ 7.  USE the CHG SOAP as instructed, the night before surgery and the day of surgery.                   Once you have washed with this soap, do NOT use any of the following: Powders, perfumes                    or lotions. Please do not wear make up, hairpins, clips or nail polish. You MAY wear deodorant.                   Men may shave their face and neck.  Women need to shave 48 hours prior to surgery.                   DO NOT wear ANY jewelry on the day of surgery. If there are rings that are too tight to                    remove easily, please address this prior to the surgery day. Piercings need to be removed.                                                                     NO METAL ON YOUR BODY.                    Do NOT bring any valuables.  If you came to Pre-Admit testing then you will not need license,                     insurance card or credit card.  If you will be staying overnight, please either leave your things in                     the car or have your family be responsible for these items.                     [facility] IS NOT RESPONSIBLE FOR BELONGINGS OR VALUABLES.  ___X__ 8. DO NOT wear contact lenses on surgery day.  You may not have dentures,                     Hearing aides, contacts or glasses in the operating room. These items can be                    Placed in the Recovery Room to receive immediately after surgery.  __x___ 9. IF YOU ARE SCHEDULED TO GO HOME ON THE SAME DAY, YOU MUST                   Have someone to drive you home and to stay with you  for the first 24 hours.                    Have an arrangement prior to arriving on surgery day.  ___x__ 10. Take the following medications on the morning of surgery with a sip of water:  1. PROTONIX                     2. LABETALOL                     3.  DICLEGIS                     4. MACROBID                     5.              _____ 11.  Follow any instructions provided to you by your surgeon.                        Such as enema, clear liquid bowel prep  __X__  12. STOP ALL ASPIRIN PRODUCTS NOW.                       THIS INCLUDES BC POWDERS / GOODIES POWDER  __x___ 13. STOP Anti-inflammatories NOW.                       This includes IBUPROFEN / MOTRIN / ADVIL / ALEVE/ NAPROXYN                    YOU MAY TAKE TYLENOL ANY TIME PRIOR TO SURGERY.  ___X__ 67.  Stop supplements until after surgery.                     This includes: PRENATAL VITAMINS                 You may continue taking Vitamin B12 / Vitamin D3 but do not take on the morning of surgery.  __X____18. If staying overnight, please have appropriate shoes to wear to be able to walk around the unit.                   Wear clean and comfortable clothing to the hospital.  IF YOU ARE BRINGING A CELL PHONE THEN PLEASE [place].

## 2020-03-23 ENCOUNTER — Other Ambulatory Visit
Admission: RE | Admit: 2020-03-23 | Discharge: 2020-03-23 | Disposition: A | Discharge status: Home / Self Care | Payer: Medicaid Other | Source: Ambulatory Visit | Attending: Obstetrics and Gynecology | Admitting: Obstetrics and Gynecology

## 2020-03-23 ENCOUNTER — Ambulatory Visit (INDEPENDENT_AMBULATORY_CARE_PROVIDER_SITE_OTHER): Payer: Medicaid Other | Admitting: Obstetrics and Gynecology

## 2020-03-23 ENCOUNTER — Encounter: Payer: Self-pay | Admitting: Obstetrics and Gynecology

## 2020-03-23 ENCOUNTER — Other Ambulatory Visit: Payer: Medicaid Other

## 2020-03-23 VITALS — BP 137/81 | HR 93 | Wt 203.9 lb

## 2020-03-23 DIAGNOSIS — Z20822 Contact with and (suspected) exposure to covid-19: Secondary | ICD-10-CM | POA: Diagnosis not present

## 2020-03-23 DIAGNOSIS — E0500 Thyrotoxicosis with diffuse goiter without thyrotoxic crisis or storm: Secondary | ICD-10-CM

## 2020-03-23 DIAGNOSIS — O0993 Supervision of high risk pregnancy, unspecified, third trimester: Secondary | ICD-10-CM

## 2020-03-23 DIAGNOSIS — O10919 Unspecified pre-existing hypertension complicating pregnancy, unspecified trimester: Secondary | ICD-10-CM

## 2020-03-23 DIAGNOSIS — I252 Old myocardial infarction: Secondary | ICD-10-CM

## 2020-03-23 DIAGNOSIS — Z01812 Encounter for preprocedural laboratory examination: Secondary | ICD-10-CM | POA: Insufficient documentation

## 2020-03-23 DIAGNOSIS — O24913 Unspecified diabetes mellitus in pregnancy, third trimester: Secondary | ICD-10-CM

## 2020-03-23 LAB — CBC
HCT: 33.1 % — ABNORMAL LOW (ref 36.0–46.0)
Hemoglobin: 11 g/dL — ABNORMAL LOW (ref 12.0–15.0)
MCH: 28 pg (ref 26.0–34.0)
MCHC: 33.2 g/dL (ref 30.0–36.0)
MCV: 84.2 fL (ref 80.0–100.0)
Platelets: 233 10*3/uL (ref 150–400)
RBC: 3.93 MIL/uL (ref 3.87–5.11)
RDW: 14.1 % (ref 11.5–15.5)
WBC: 9.4 10*3/uL (ref 4.0–10.5)
nRBC: 0 % (ref 0.0–0.2)

## 2020-03-23 LAB — SARS CORONAVIRUS 2 (TAT 6-24 HRS): SARS Coronavirus 2: NEGATIVE

## 2020-03-23 LAB — POCT URINALYSIS DIPSTICK OB
Bilirubin, UA: NEGATIVE
Blood, UA: NEGATIVE
Glucose, UA: NEGATIVE
Ketones, UA: NEGATIVE
Leukocytes, UA: NEGATIVE
Nitrite, UA: NEGATIVE
Spec Grav, UA: 1.01 (ref 1.010–1.025)
Urobilinogen, UA: 0.2 E.U./dL
pH, UA: 6.5 (ref 5.0–8.0)

## 2020-03-23 LAB — TYPE AND SCREEN
ABO/RH(D): O POS
Antibody Screen: NEGATIVE
Extend sample reason: UNDETERMINED

## 2020-03-23 NOTE — Progress Notes (Signed)
[name]: Patient complains of lower abdominal pressure.  Urine negative.  NST today only reactive to 10 by 10 but patient has done this multiple times in the last several weeks and her subsequent follow-up BPP or NST has always been normal.  Consulted with Dr. Valentino Saxon and we both agree that her cesarean delivery 36 hours from now is adequate for follow-up.  Kick counts discussed and stressed to the patient and she is to contact us immediately if the baby stops moving.  She did not bring her sugar log today she "forgot it in the car that dropped her off."  All questions answered regarding cesarean on Friday.  Necessity of preop Covid and lab work discussed.  Patient to have those today.

## 2020-03-24 LAB — RPR: RPR Ser Ql: NONREACTIVE

## 2020-03-25 ENCOUNTER — Other Ambulatory Visit: Payer: Self-pay

## 2020-03-25 ENCOUNTER — Inpatient Hospital Stay
Admission: RE | Admit: 2020-03-25 | Discharge: 2020-03-27 | DRG: 783 | Disposition: A | Discharge status: Home / Self Care | Payer: Medicaid Other | Attending: Obstetrics and Gynecology | Admitting: Obstetrics and Gynecology

## 2020-03-25 ENCOUNTER — Encounter
Admission: RE | Disposition: A | Discharge status: Home / Self Care | Payer: Self-pay | Source: Home / Self Care | Attending: Obstetrics and Gynecology

## 2020-03-25 ENCOUNTER — Inpatient Hospital Stay: Payer: Medicaid Other | Admitting: Certified Registered Nurse Anesthetist

## 2020-03-25 ENCOUNTER — Encounter: Payer: Self-pay | Admitting: Obstetrics and Gynecology

## 2020-03-25 DIAGNOSIS — I10 Essential (primary) hypertension: Secondary | ICD-10-CM | POA: Diagnosis present

## 2020-03-25 DIAGNOSIS — O1092 Unspecified pre-existing hypertension complicating childbirth: Secondary | ICD-10-CM

## 2020-03-25 DIAGNOSIS — O34211 Maternal care for low transverse scar from previous cesarean delivery: Secondary | ICD-10-CM | POA: Diagnosis present

## 2020-03-25 DIAGNOSIS — E119 Type 2 diabetes mellitus without complications: Secondary | ICD-10-CM | POA: Diagnosis present

## 2020-03-25 DIAGNOSIS — E669 Obesity, unspecified: Secondary | ICD-10-CM | POA: Diagnosis present

## 2020-03-25 DIAGNOSIS — L905 Scar conditions and fibrosis of skin: Secondary | ICD-10-CM | POA: Diagnosis present

## 2020-03-25 DIAGNOSIS — Z302 Encounter for sterilization: Secondary | ICD-10-CM

## 2020-03-25 DIAGNOSIS — O9952 Diseases of the respiratory system complicating childbirth: Secondary | ICD-10-CM | POA: Diagnosis not present

## 2020-03-25 DIAGNOSIS — O99334 Smoking (tobacco) complicating childbirth: Secondary | ICD-10-CM | POA: Diagnosis present

## 2020-03-25 DIAGNOSIS — O2412 Pre-existing diabetes mellitus, type 2, in childbirth: Secondary | ICD-10-CM | POA: Diagnosis present

## 2020-03-25 DIAGNOSIS — I252 Old myocardial infarction: Secondary | ICD-10-CM

## 2020-03-25 DIAGNOSIS — O0990 Supervision of high risk pregnancy, unspecified, unspecified trimester: Secondary | ICD-10-CM

## 2020-03-25 DIAGNOSIS — E0500 Thyrotoxicosis with diffuse goiter without thyrotoxic crisis or storm: Secondary | ICD-10-CM | POA: Diagnosis present

## 2020-03-25 DIAGNOSIS — J4520 Mild intermittent asthma, uncomplicated: Secondary | ICD-10-CM

## 2020-03-25 DIAGNOSIS — O99214 Obesity complicating childbirth: Secondary | ICD-10-CM | POA: Diagnosis present

## 2020-03-25 DIAGNOSIS — O24913 Unspecified diabetes mellitus in pregnancy, third trimester: Secondary | ICD-10-CM

## 2020-03-25 DIAGNOSIS — I25118 Atherosclerotic heart disease of native coronary artery with other forms of angina pectoris: Secondary | ICD-10-CM | POA: Diagnosis present

## 2020-03-25 DIAGNOSIS — M549 Dorsalgia, unspecified: Secondary | ICD-10-CM

## 2020-03-25 DIAGNOSIS — Z3A39 39 weeks gestation of pregnancy: Secondary | ICD-10-CM | POA: Diagnosis not present

## 2020-03-25 DIAGNOSIS — F3181 Bipolar II disorder: Secondary | ICD-10-CM | POA: Diagnosis present

## 2020-03-25 DIAGNOSIS — O2402 Pre-existing diabetes mellitus, type 1, in childbirth: Secondary | ICD-10-CM

## 2020-03-25 DIAGNOSIS — O9972 Diseases of the skin and subcutaneous tissue complicating childbirth: Secondary | ICD-10-CM | POA: Diagnosis present

## 2020-03-25 DIAGNOSIS — Z8759 Personal history of other complications of pregnancy, childbirth and the puerperium: Secondary | ICD-10-CM

## 2020-03-25 DIAGNOSIS — O9081 Anemia of the puerperium: Secondary | ICD-10-CM | POA: Diagnosis not present

## 2020-03-25 DIAGNOSIS — O99824 Streptococcus B carrier state complicating childbirth: Secondary | ICD-10-CM | POA: Diagnosis present

## 2020-03-25 DIAGNOSIS — O1002 Pre-existing essential hypertension complicating childbirth: Secondary | ICD-10-CM | POA: Diagnosis present

## 2020-03-25 DIAGNOSIS — O99284 Endocrine, nutritional and metabolic diseases complicating childbirth: Secondary | ICD-10-CM | POA: Diagnosis present

## 2020-03-25 DIAGNOSIS — L910 Hypertrophic scar: Secondary | ICD-10-CM

## 2020-03-25 DIAGNOSIS — O10919 Unspecified pre-existing hypertension complicating pregnancy, unspecified trimester: Secondary | ICD-10-CM | POA: Diagnosis present

## 2020-03-25 DIAGNOSIS — O99891 Other specified diseases and conditions complicating pregnancy: Secondary | ICD-10-CM

## 2020-03-25 DIAGNOSIS — F17210 Nicotine dependence, cigarettes, uncomplicated: Secondary | ICD-10-CM | POA: Diagnosis present

## 2020-03-25 DIAGNOSIS — Z98891 History of uterine scar from previous surgery: Secondary | ICD-10-CM

## 2020-03-25 LAB — GLUCOSE, CAPILLARY
Glucose-Capillary: 96 mg/dL (ref 70–99)
Glucose-Capillary: 89 mg/dL (ref 70–99)
Glucose-Capillary: 122 mg/dL — ABNORMAL HIGH (ref 70–99)
Glucose-Capillary: 82 mg/dL (ref 70–99)

## 2020-03-25 LAB — URINE DRUG SCREEN, QUALITATIVE (ARMC ONLY)
Amphetamines, Ur Screen: NOT DETECTED
Barbiturates, Ur Screen: NOT DETECTED
Benzodiazepine, Ur Scrn: NOT DETECTED
Cannabinoid 50 Ng, Ur ~~LOC~~: POSITIVE — AB
Cocaine Metabolite,Ur ~~LOC~~: NOT DETECTED
MDMA (Ecstasy)Ur Screen: NOT DETECTED
Methadone Scn, Ur: NOT DETECTED
Opiate, Ur Screen: NOT DETECTED
Phencyclidine (PCP) Ur S: NOT DETECTED
Tricyclic, Ur Screen: NOT DETECTED

## 2020-03-25 SURGERY — Surgical Case
Anesthesia: Spinal | Laterality: Bilateral

## 2020-03-25 MED ORDER — BUPIVACAINE LIPOSOME 1.3 % IJ SUSP
INTRAMUSCULAR | Status: AC
  Filled 2020-03-25: qty 20

## 2020-03-25 MED ORDER — BUPIVACAINE LIPOSOME 1.3 % IJ SUSP
INTRAMUSCULAR | Status: DC | PRN
  Administered 2020-03-25: 20 mL

## 2020-03-25 MED ORDER — GABAPENTIN 300 MG PO CAPS
300.0000 mg | ORAL_CAPSULE | ORAL | Status: AC
  Administered 2020-03-25: 300 mg via ORAL
  Filled 2020-03-25: qty 1

## 2020-03-25 MED ORDER — PHENYLEPHRINE HCL (PRESSORS) 10 MG/ML IV SOLN
INTRAVENOUS | Status: AC
  Filled 2020-03-25: qty 1

## 2020-03-25 MED ORDER — SENNOSIDES-DOCUSATE SODIUM 8.6-50 MG PO TABS
2.0000 | ORAL_TABLET | ORAL | Status: DC
  Administered 2020-03-26 (×2): 2 via ORAL
  Filled 2020-03-25 (×2): qty 2

## 2020-03-25 MED ORDER — PHENYLEPHRINE HCL (PRESSORS) 10 MG/ML IV SOLN
INTRAVENOUS | Status: DC | PRN
  Administered 2020-03-25: 200 ug via INTRAVENOUS
  Administered 2020-03-25: 100 ug via INTRAVENOUS

## 2020-03-25 MED ORDER — DIPHENHYDRAMINE HCL 50 MG/ML IJ SOLN: 12.5000 mg | INTRAMUSCULAR | Status: DC | PRN

## 2020-03-25 MED ORDER — OXYTOCIN 40 UNITS IN NORMAL SALINE INFUSION - SIMPLE MED
2.5000 [IU]/h | INTRAVENOUS | Status: AC
  Administered 2020-03-25: 2.5 [IU]/h via INTRAVENOUS

## 2020-03-25 MED ORDER — ONDANSETRON HCL 4 MG/2ML IJ SOLN: 4.0000 mg | Freq: Once | INTRAMUSCULAR | Status: DC | PRN

## 2020-03-25 MED ORDER — BUPIVACAINE HCL (PF) 0.5 % IJ SOLN
INTRAMUSCULAR | Status: DC | PRN
  Administered 2020-03-25: 30 mL

## 2020-03-25 MED ORDER — MAGNESIUM HYDROXIDE 400 MG/5ML PO SUSP: 30.0000 mL | ORAL | Status: DC | PRN

## 2020-03-25 MED ORDER — SODIUM CHLORIDE (PF) 0.9 % IJ SOLN
INTRAMUSCULAR | Status: AC
  Filled 2020-03-25: qty 50

## 2020-03-25 MED ORDER — NALBUPHINE HCL 10 MG/ML IJ SOLN: 5.0000 mg | Freq: Once | INTRAMUSCULAR | Status: DC | PRN

## 2020-03-25 MED ORDER — DIBUCAINE (PERIANAL) 1 % EX OINT: 1.0000 "application " | TOPICAL_OINTMENT | CUTANEOUS | Status: DC | PRN

## 2020-03-25 MED ORDER — PRENATAL MULTIVITAMIN CH
1.0000 | ORAL_TABLET | Freq: Every day | ORAL | Status: DC
  Administered 2020-03-25 – 2020-03-26 (×2): 1 via ORAL
  Filled 2020-03-25 (×3): qty 1

## 2020-03-25 MED ORDER — MEPERIDINE HCL 25 MG/ML IJ SOLN: 6.2500 mg | INTRAMUSCULAR | Status: DC | PRN

## 2020-03-25 MED ORDER — LABETALOL HCL 100 MG PO TABS
100.0000 mg | ORAL_TABLET | Freq: Two times a day (BID) | ORAL | Status: DC
  Administered 2020-03-25 – 2020-03-26 (×3): 100 mg via ORAL
  Filled 2020-03-25 (×3): qty 1

## 2020-03-25 MED ORDER — SIMETHICONE 80 MG PO CHEW
80.0000 mg | CHEWABLE_TABLET | ORAL | Status: DC | PRN
  Administered 2020-03-26: 80 mg via ORAL
  Filled 2020-03-25: qty 1

## 2020-03-25 MED ORDER — OXYTOCIN 40 UNITS IN NORMAL SALINE INFUSION - SIMPLE MED
INTRAVENOUS | Status: AC
  Filled 2020-03-25: qty 1000

## 2020-03-25 MED ORDER — MORPHINE SULFATE (PF) 0.5 MG/ML IJ SOLN
INTRAMUSCULAR | Status: AC
  Filled 2020-03-25: qty 10

## 2020-03-25 MED ORDER — NALOXONE HCL 0.4 MG/ML IJ SOLN: 0.4000 mg | INTRAMUSCULAR | Status: DC | PRN

## 2020-03-25 MED ORDER — OXYCODONE HCL 5 MG PO TABS
5.0000 mg | ORAL_TABLET | ORAL | Status: DC | PRN
  Administered 2020-03-25 – 2020-03-26 (×3): 5 mg via ORAL
  Filled 2020-03-25 (×3): qty 1

## 2020-03-25 MED ORDER — LIDOCAINE HCL (PF) 1 % IJ SOLN
INTRAMUSCULAR | Status: DC | PRN
  Administered 2020-03-25: 3 mL via SUBCUTANEOUS

## 2020-03-25 MED ORDER — SOD CITRATE-CITRIC ACID 500-334 MG/5ML PO SOLN
ORAL | Status: AC
  Administered 2020-03-25: 30 mL via ORAL
  Filled 2020-03-25: qty 15

## 2020-03-25 MED ORDER — CLINDAMYCIN PHOSPHATE 900 MG/50ML IV SOLN
900.0000 mg | INTRAVENOUS | Status: AC
  Administered 2020-03-25: 900 mg via INTRAVENOUS
  Filled 2020-03-25: qty 50

## 2020-03-25 MED ORDER — SODIUM CHLORIDE 0.9 % IV SOLN: INTRAVENOUS | Status: DC

## 2020-03-25 MED ORDER — IBUPROFEN 600 MG PO TABS: 600.0000 mg | ORAL_TABLET | Freq: Four times a day (QID) | ORAL | Status: DC | PRN

## 2020-03-25 MED ORDER — SODIUM CHLORIDE 0.9 % IR SOLN
Status: DC | PRN
  Administered 2020-03-25: 50 mL

## 2020-03-25 MED ORDER — COCONUT OIL OIL
1.0000 "application " | TOPICAL_OIL | Status: DC | PRN
  Filled 2020-03-25: qty 120

## 2020-03-25 MED ORDER — NALBUPHINE HCL 10 MG/ML IJ SOLN: 5.0000 mg | INTRAMUSCULAR | Status: DC | PRN

## 2020-03-25 MED ORDER — TRAMADOL HCL 50 MG PO TABS: 50.0000 mg | ORAL_TABLET | Freq: Four times a day (QID) | ORAL | Status: DC | PRN

## 2020-03-25 MED ORDER — WITCH HAZEL-GLYCERIN EX PADS: 1.0000 "application " | MEDICATED_PAD | CUTANEOUS | Status: DC | PRN

## 2020-03-25 MED ORDER — TRIAMCINOLONE ACETONIDE 40 MG/ML IJ SUSP
20.0000 mg | Freq: Once | INTRAMUSCULAR | Status: DC
  Filled 2020-03-25: qty 2
  Filled 2020-03-25: qty 0.5

## 2020-03-25 MED ORDER — EPHEDRINE SULFATE-NACL 50-0.9 MG/10ML-% IV SOSY
PREFILLED_SYRINGE | INTRAVENOUS | Status: DC | PRN
  Administered 2020-03-25: 10 mg via INTRAMUSCULAR

## 2020-03-25 MED ORDER — DIPHENHYDRAMINE HCL 25 MG PO CAPS: 25.0000 mg | ORAL_CAPSULE | ORAL | Status: DC | PRN

## 2020-03-25 MED ORDER — IBUPROFEN 800 MG PO TABS
800.0000 mg | ORAL_TABLET | Freq: Three times a day (TID) | ORAL | Status: DC
  Administered 2020-03-25 – 2020-03-27 (×6): 800 mg via ORAL
  Filled 2020-03-25 (×6): qty 1

## 2020-03-25 MED ORDER — HYDROMORPHONE HCL 1 MG/ML IJ SOLN: 1.0000 mg | INTRAMUSCULAR | Status: DC | PRN

## 2020-03-25 MED ORDER — NALOXONE HCL 4 MG/10ML IJ SOLN
1.0000 ug/kg/h | INTRAVENOUS | Status: DC | PRN
  Filled 2020-03-25: qty 5

## 2020-03-25 MED ORDER — ACETAMINOPHEN 500 MG PO TABS
1000.0000 mg | ORAL_TABLET | ORAL | Status: AC
  Administered 2020-03-25: 1000 mg via ORAL
  Filled 2020-03-25: qty 2

## 2020-03-25 MED ORDER — PROPOFOL 10 MG/ML IV BOLUS
INTRAVENOUS | Status: AC
  Filled 2020-03-25: qty 20

## 2020-03-25 MED ORDER — ONDANSETRON HCL 4 MG/2ML IJ SOLN
INTRAMUSCULAR | Status: AC
  Filled 2020-03-25: qty 2

## 2020-03-25 MED ORDER — SIMETHICONE 80 MG PO CHEW
80.0000 mg | CHEWABLE_TABLET | ORAL | Status: DC
  Administered 2020-03-26 (×2): 80 mg via ORAL
  Filled 2020-03-25 (×2): qty 1

## 2020-03-25 MED ORDER — ONDANSETRON HCL 4 MG PO TABS
4.0000 mg | ORAL_TABLET | Freq: Four times a day (QID) | ORAL | Status: DC | PRN
  Administered 2020-03-25: 4 mg via ORAL
  Filled 2020-03-25: qty 1

## 2020-03-25 MED ORDER — GENTAMICIN SULFATE 40 MG/ML IJ SOLN
5.0000 mg/kg | INTRAVENOUS | Status: AC
  Administered 2020-03-25: 370 mg via INTRAVENOUS
  Filled 2020-03-25: qty 9.25

## 2020-03-25 MED ORDER — GABAPENTIN 300 MG PO CAPS
300.0000 mg | ORAL_CAPSULE | Freq: Two times a day (BID) | ORAL | Status: DC
  Filled 2020-03-25 (×4): qty 1

## 2020-03-25 MED ORDER — FENTANYL CITRATE (PF) 100 MCG/2ML IJ SOLN
INTRAMUSCULAR | Status: AC
  Filled 2020-03-25: qty 2

## 2020-03-25 MED ORDER — ENOXAPARIN SODIUM 40 MG/0.4ML ~~LOC~~ SOLN
40.0000 mg | SUBCUTANEOUS | Status: AC
  Administered 2020-03-25: 40 mg via SUBCUTANEOUS
  Filled 2020-03-25: qty 0.4

## 2020-03-25 MED ORDER — FENTANYL CITRATE (PF) 100 MCG/2ML IJ SOLN
INTRAMUSCULAR | Status: DC | PRN
  Administered 2020-03-25: 15 ug via INTRAVENOUS

## 2020-03-25 MED ORDER — ZOLPIDEM TARTRATE 5 MG PO TABS: 5.0000 mg | ORAL_TABLET | Freq: Every evening | ORAL | Status: DC | PRN

## 2020-03-25 MED ORDER — ENOXAPARIN SODIUM 40 MG/0.4ML ~~LOC~~ SOLN
40.0000 mg | SUBCUTANEOUS | Status: DC
  Administered 2020-03-26: 40 mg via SUBCUTANEOUS
  Filled 2020-03-25: qty 0.4

## 2020-03-25 MED ORDER — MORPHINE SULFATE (PF) 0.5 MG/ML IJ SOLN
INTRAMUSCULAR | Status: DC | PRN
  Administered 2020-03-25: 10 mg via EPIDURAL

## 2020-03-25 MED ORDER — OXYCODONE-ACETAMINOPHEN 5-325 MG PO TABS: 2.0000 | ORAL_TABLET | ORAL | Status: DC | PRN

## 2020-03-25 MED ORDER — DIPHENHYDRAMINE HCL 25 MG PO CAPS
25.0000 mg | ORAL_CAPSULE | Freq: Four times a day (QID) | ORAL | Status: DC | PRN
  Administered 2020-03-26: 25 mg via ORAL
  Filled 2020-03-25: qty 1

## 2020-03-25 MED ORDER — FENTANYL CITRATE (PF) 100 MCG/2ML IJ SOLN
25.0000 ug | INTRAMUSCULAR | Status: DC | PRN
  Administered 2020-03-25: 25 ug via INTRAVENOUS
  Filled 2020-03-25: qty 2

## 2020-03-25 MED ORDER — ONDANSETRON HCL 4 MG/2ML IJ SOLN
INTRAMUSCULAR | Status: DC | PRN
  Administered 2020-03-25: 4 mg via INTRAVENOUS

## 2020-03-25 MED ORDER — BUPIVACAINE HCL (PF) 0.5 % IJ SOLN
INTRAMUSCULAR | Status: AC
  Filled 2020-03-25: qty 30

## 2020-03-25 MED ORDER — FERROUS SULFATE 325 (65 FE) MG PO TABS
325.0000 mg | ORAL_TABLET | Freq: Two times a day (BID) | ORAL | Status: DC
  Administered 2020-03-25 – 2020-03-26 (×2): 325 mg via ORAL
  Filled 2020-03-25 (×2): qty 1

## 2020-03-25 MED ORDER — METHYLERGONOVINE MALEATE 0.2 MG/ML IJ SOLN
INTRAMUSCULAR | Status: AC
  Filled 2020-03-25: qty 1

## 2020-03-25 MED ORDER — LACTATED RINGERS IV SOLN: INTRAVENOUS | Status: DC

## 2020-03-25 MED ORDER — OXYTOCIN 40 UNITS IN NORMAL SALINE INFUSION - SIMPLE MED
INTRAVENOUS | Status: DC | PRN
  Administered 2020-03-25: 600 mL/h via INTRAVENOUS

## 2020-03-25 MED ORDER — SODIUM CHLORIDE 0.9% FLUSH: 3.0000 mL | INTRAVENOUS | Status: DC | PRN

## 2020-03-25 MED ORDER — ACETAMINOPHEN 500 MG PO TABS
1000.0000 mg | ORAL_TABLET | Freq: Four times a day (QID) | ORAL | Status: AC
  Administered 2020-03-25 – 2020-03-26 (×4): 1000 mg via ORAL
  Filled 2020-03-25 (×4): qty 2

## 2020-03-25 MED ORDER — MENTHOL 3 MG MT LOZG
1.0000 | LOZENGE | OROMUCOSAL | Status: DC | PRN
  Filled 2020-03-25: qty 9

## 2020-03-25 MED ORDER — SOD CITRATE-CITRIC ACID 500-334 MG/5ML PO SOLN: 30.0000 mL | ORAL | Status: AC

## 2020-03-25 MED ORDER — SODIUM CHLORIDE 0.9 % IV SOLN
INTRAVENOUS | Status: DC | PRN
  Administered 2020-03-25: 50 ug/min via INTRAVENOUS

## 2020-03-25 MED ORDER — BUPIVACAINE IN DEXTROSE 0.75-8.25 % IT SOLN
INTRATHECAL | Status: DC | PRN
  Administered 2020-03-25: 1.6 mL via INTRATHECAL

## 2020-03-25 MED ORDER — CARBOPROST TROMETHAMINE 250 MCG/ML IM SOLN
INTRAMUSCULAR | Status: AC
  Filled 2020-03-25: qty 1

## 2020-03-25 SURGICAL SUPPLY — 37 items
APL PRP STRL LF DISP 70% ISPRP (MISCELLANEOUS) ×2
BAG COUNTER SPONGE EZ (MISCELLANEOUS) ×2 IMPLANT
BAG SPNG 4X4 CLR HAZ (MISCELLANEOUS) ×1
CANISTER SUCT 3000ML PPV (MISCELLANEOUS) ×3 IMPLANT
CHLORAPREP W/TINT 26 (MISCELLANEOUS) ×6 IMPLANT
CLOSURE STERI STRIP 1/2 X4 (GAUZE/BANDAGES/DRESSINGS) ×1 IMPLANT
CLOSURE WOUND 1/2 X4 (GAUZE/BANDAGES/DRESSINGS) ×1
COUNTER SPONGE BAG EZ (MISCELLANEOUS) ×1
COVER WAND RF STERILE (DRAPES) ×3 IMPLANT
DRSG TELFA 3X8 NADH (GAUZE/BANDAGES/DRESSINGS) ×3 IMPLANT
ELECT REM PT RETURN 9FT ADLT (ELECTROSURGICAL) ×3
ELECTRODE REM PT RTRN 9FT ADLT (ELECTROSURGICAL) ×1 IMPLANT
EXTRT SYSTEM ALEXIS 17CM (MISCELLANEOUS)
GAUZE SPONGE 4X4 12PLY STRL (GAUZE/BANDAGES/DRESSINGS) ×3 IMPLANT
GLOVE BIO SURGEON STRL SZ 6.5 (GLOVE) ×2 IMPLANT
GLOVE BIO SURGEONS STRL SZ 6.5 (GLOVE) ×1
GLOVE INDICATOR 7.0 STRL GRN (GLOVE) ×3 IMPLANT
GOWN STRL REUS W/ TWL LRG LVL3 (GOWN DISPOSABLE) ×2 IMPLANT
GOWN STRL REUS W/TWL LRG LVL3 (GOWN DISPOSABLE) ×6
KIT TURNOVER KIT A (KITS) ×3 IMPLANT
NS IRRIG 1000ML POUR BTL (IV SOLUTION) ×3 IMPLANT
PACK C SECTION AR (MISCELLANEOUS) ×3 IMPLANT
PAD DRESSING TELFA 3X8 NADH (GAUZE/BANDAGES/DRESSINGS) ×1 IMPLANT
PAD OB MATERNITY 4.3X12.25 (PERSONAL CARE ITEMS) ×3 IMPLANT
PAD PREP 24X41 OB/GYN DISP (PERSONAL CARE ITEMS) ×3 IMPLANT
PENCIL SMOKE ULTRAEVAC 22 CON (MISCELLANEOUS) ×3 IMPLANT
RETRACTOR WND ALEXIS-O 25 LRG (MISCELLANEOUS) IMPLANT
RTRCTR WOUND ALEXIS O 25CM LRG (MISCELLANEOUS) ×3
SPONGE GAUZE 4X4 12PLY (GAUZE/BANDAGES/DRESSINGS) ×2 IMPLANT
STRIP CLOSURE SKIN 1/2X4 (GAUZE/BANDAGES/DRESSINGS) ×1 IMPLANT
SUT MNCRL AB 4-0 PS2 18 (SUTURE) ×3 IMPLANT
SUT PLAIN 2 0 XLH (SUTURE) IMPLANT
SUT VIC AB 0 CT1 36 (SUTURE) ×12 IMPLANT
SUT VIC AB 3-0 SH 27 (SUTURE) ×3
SUT VIC AB 3-0 SH 27X BRD (SUTURE) ×1 IMPLANT
SYSTEM CONTND EXTRCTN KII BLLN (MISCELLANEOUS) IMPLANT
TAPE MEDIFIX FOAM 3 (GAUZE/BANDAGES/DRESSINGS) ×2 IMPLANT

## 2020-03-25 NOTE — H&P (Signed)
Obstetric Preoperative History and Physical  Michelle Osborn is a 34 y.o. [id number] with IUP at [redacted]w[redacted]d presenting for presenting for scheduled repeat cesarean section with bilateral tubal ligation.  No acute concerns today.   Prenatal Course Source of Care: [facility] with onset of care at 13 weeks Pregnancy complications or risks: Patient Active Problem List   Diagnosis Date Noted  . Chronic hypertension in pregnancy 12/17/2019  . UTI (urinary tract infection) during pregnancy 09/14/2019  . Back pain affecting pregnancy in third trimester 09/14/2019  . Low TSH level 09/09/2019  . Chest pain 08/30/2019  . History of pre-eclampsia 05/01/2019  . Supervision of high risk pregnancy, antepartum 01/16/2019  . Diabetes mellitus complicating pregnancy 01/16/2019  . 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 Tachycardia 01/09/2019  . Mixed hyperlipidemia 01/09/2019  . Hyperthyroidism affecting pregnancy in second trimester 01/07/2019  . Lead exposure 12/06/2018  . Smoker 12/06/2018  . Bipolar 2 disorder (HCC) 12/06/2018  . History of anxiety 12/06/2018  . Mild intermittent asthma without complication 12/06/2018  . Type 2 diabetes mellitus without complication, without long-term current use of insulin (HCC) 12/06/2018  . High-risk pregnancy in second trimester 12/06/2018  . History of heart attack 12/06/2018  . Graves disease 10/30/2018  . History of cesarean section 10/30/2018  . Nausea/vomiting in pregnancy 10/30/2018  . History of marijuana use 10/30/2018  . HYPERCHOLESTEROLEMIA 12/08/2009  . HIDRADENITIS SUPPURATIVA 12/08/2009  . VITAMIN D DEFICIENCY 11/18/2009  . GERD 11/18/2009  . MENORRHAGIA 11/18/2009  . Diabetes mellitus type 2 with complications (HCC) 11/17/2009  . Essential hypertension 11/17/2009  . ASTHMA 11/17/2009   She plans to breast and bottle feed She desires bilateral tubal ligation for postpartum  contraception.   Prenatal labs and studies: ABO, Rh: --/--/O POS (03/24 1247) Antibody: NEG (03/24 1247) Rubella: 5.76 (09/29 1122) RPR: NON REACTIVE (03/24 1247)  HBsAg: Negative (09/29 1122)  HIV: Non Reactive (09/29 1122)  GBS:--/Positive (03/09 1631) 1 hr Glucola  Not performed. Patient with history of DM.  Genetic screening normal Anatomy US normal   Past Medical History:  Diagnosis Date  . Anemia   . Anginal pain (HCC)    on and off.   . Anxiety   . Asthma    "grew out' not since teenager  . Bipolar 1 disorder (HCC)   . Depression   . Diabetes mellitus    not currently on meds, normal A1c  . Discoloration of skin of foot 02/2020   pt wakes up with purple feet every am. it dissipates as day goes on.  . Hydradenitis   . Hypertension   . Hyperthyroidism   . Infection    UTI  . MI (myocardial infarction) (HCC) 2017  . Neuropathy   . Syncope 02/2020   encouraged to hydrate and have snacks nearby. has passed out. happens several x a week.  . Tachycardia   . Thyroid disease     Past Surgical History:  Procedure Laterality Date  . ADENOIDECTOMY    . APPENDECTOMY    . CARDIAC CATHETERIZATION  03/2016   [place], [place]   . CESAREAN SECTION    . CESAREAN SECTION N/A 05/05/2019   Procedure: REPEAT CESAREAN SECTION;  Surgeon: Linzie Collin, MD;  Location: [facility] ORS;  Service: Obstetrics;  Laterality: N/A;  . CORONARY ANGIOPLASTY WITH STENT PLACEMENT  03/2016   2 stents  . DILATION AND CURETTAGE OF UTERUS  2014  . EXCISION OF HYDRADENITIS  2009, 2012  REMOVAL OF SWEAT GLANDS  . OTHER SURGICAL HISTORY     sweat gland excision  . TONSILLECTOMY      OB History  Gravida Para Term Preterm AB Living  3 2 2     2   SAB TAB Ectopic Multiple Live Births        0 2    # Outcome Date GA Lbr Len/2nd Weight Sex Delivery Anes PTL Lv  3 Current           2 Term 05/05/19 [redacted]w[redacted]d  2730 g M CS-LTranv Spinal  LIV  1 Term 11/19/17    F CS-LTranv   LIV     Complications: Fetal  Intolerance    Social History   Socioeconomic History  . Marital status: Significant Other    Spouse name: 11/21/17  . Number of children: 3  . Years of education: Not on file  . Highest education level: Not on file  Occupational History  . Occupation: homemaker  Tobacco Use  . Smoking status: Current Every Day Smoker    Packs/day: 0.25    Years: 10.00    Pack years: 2.50    Types: Cigarettes  . Smokeless tobacco: Never Used  . Tobacco comment: maybe 3 a day  Substance and Sexual Activity  . Alcohol use: No  . Drug use: Yes    Types: Marijuana    Comment: uses to enhance appetite & to handle nausea  . Sexual activity: Yes    Birth control/protection: None  Other Topics Concern  . Not on file  Social History Narrative   Patient lives with significant other, his 34 year old and 2 toddlers   Social Determinants of Health   Financial Resource Strain:   . Difficulty of Paying Living Expenses:   Food Insecurity:   . Worried About 9 in the Last Year:   . Programme researcher, broadcasting/film/video in the Last Year:   Transportation Needs:   . Barista (Medical):   Freight forwarder Lack of Transportation (Non-Medical):   Physical Activity:   . Days of Exercise per Week:   . Minutes of Exercise per Session:   Stress:   . Feeling of Stress :   Social Connections:   . Frequency of Communication with Friends and Family:   . Frequency of Social Gatherings with Friends and Family:   . Attends Religious Services:   . Active Member of Clubs or Organizations:   . Attends [employer] Meetings:   Banker Marital Status:     Family History  Problem Relation Age of Onset  . Hypertension Mother   . Asthma Mother   . Diabetes Mother   . Hyperlipidemia Mother   . Heart disease Mother   . Heart failure Mother        Defib placement  . Hypertension Father   . Kidney disease Father   . Heart disease Father   . Cancer Maternal Aunt   . Cancer Maternal Grandmother     Medications  Prior to Admission  Medication Sig Dispense Refill Last Dose  . DICLEGIS 10-10 MG TBEC Take 10 mg by mouth 4 (four) times daily. (Patient taking differently: Take 10 mg by mouth in the morning and at bedtime. ) 60 tablet 1   . EPINEPHrine 0.3 mg/0.3 mL IJ SOAJ injection Inject 0.3 mLs (0.3 mg total) into the muscle as needed. 1 each 6   . labetalol (NORMODYNE) 100 MG tablet Take 100 mg by mouth 2 (two)  times daily.   03/24/2020 at Unknown time  . nitrofurantoin, macrocrystal-monohydrate, (MACROBID) 100 MG capsule Take 1 capsule (100 mg total) by mouth daily. 60 capsule 2 03/24/2020 at Unknown time  . Prenatal Vit-Fe Fumarate-FA (PRENATAL MULTIVITAMIN) TABS tablet Take 1 tablet by mouth daily at 12 noon. 30 tablet 0 03/24/2020 at Unknown time  . promethazine (PHENERGAN) 25 MG tablet Take 1 tablet (25 mg total) by mouth every 6 (six) hours as needed for nausea or vomiting. 30 tablet 1 Past Week at Unknown time  . levothyroxine (SYNTHROID) 50 MCG tablet Take 1 tablet (50 mcg total) by mouth daily before breakfast. (Patient not taking: Reported on 03/16/2020) 30 tablet 1   . oxyCODONE-acetaminophen (PERCOCET) 5-325 MG tablet Take 1 tablet by mouth every 4 (four) hours as needed for severe pain. (Patient not taking: Reported on 02/23/2020) 20 tablet 0   . pantoprazole (PROTONIX) 20 MG tablet Take 1 tablet (20 mg total) by mouth 2 (two) times daily before a meal. (Patient not taking: Reported on 03/25/2020) 60 tablet 3 Not Taking at Unknown time  . sulfamethoxazole-trimethoprim (BACTRIM DS) 800-160 MG tablet Take 1 tablet by mouth 2 (two) times daily. (Patient not taking: Reported on 02/23/2020) 14 tablet 0     Allergies  Allergen Reactions  . Darvocet [Propoxyphene N-Acetaminophen] Anaphylaxis    Also makes her stomach hurt  . Peanut-Containing Drug Products Shortness Of Breath and Swelling  . Penicillins Shortness Of Breath and Swelling    Did it involve swelling of the face/tongue/throat, SOB, or low BP?  Yes Did it involve sudden or severe rash/hives, skin peeling, or any reaction on the inside of your mouth or nose? No Did you need to seek medical attention at a hospital or doctor's office? Yes When did it last happen?2007 If all above answers are "NO", may proceed with cephalosporin use.   . Cephalexin Hives and Itching  . Toradol [Ketorolac Tromethamine] Rash    Red rash with bumps  . Tramadol Nausea And Vomiting    Review of Systems: Negative except for what is mentioned in HPI.  Physical Exam: BP 125/85 (BP Location: Left Arm)   Pulse 92   Temp 98.1 F (36.7 C) (Oral)   Resp 16   Ht 5\' 7"  (1.702 m)   Wt 93 kg   LMP 06/25/2019   BMI 32.11 kg/m  FHR by Doppler: 145 bpm GENERAL: Well-developed, well-nourished female in no acute distress.  LUNGS: Clear to auscultation bilaterally.  HEART: Regular rate and rhythm. ABDOMEN: Soft, nontender, nondistended, gravid, well-healed Pfannenstiel incision with thick keloid scar. PELVIC: Deferred EXTREMITIES: Nontender, no edema, 2+ distal pulses.   Pertinent Labs/Studies:   Results for orders placed or performed during the hospital encounter of 03/25/20 (from the past 72 hour(s))  Glucose, capillary     Status: None   Collection Time: 03/25/20  6:35 AM  Result Value Ref Range   Glucose-Capillary 96 70 - 99 mg/dL    Comment: Glucose reference range applies only to samples taken after fasting for at least 8 hours.    Assessment and Plan :Alexandre Lightsey is a 34 y.o. G3P2002 at [redacted]w[redacted]d being admitted  for scheduled repeat cesarean section delivery with bilateral tubal ligation. Also will perform escharotomy at time of procedure. The patient is understanding of the planned procedure and is aware of and accepting of all surgical risks, including but not limited to: bleeding which may require transfusion or reoperation; infection which may require antibiotics; injury to bowel, bladder, ureters or  other surrounding organs which may require  repair; injury to the fetus; need for additional procedures including hysterectomy in the event of life-threatening complications; placental abnormalities wth subsequent pregnancies; incisional problems; blood clot disorders which may require blood thinners; and other postoperative/anesthesia complications. The patient is in agreement with the proposed plan, and gives informed written consent for the procedure. All questions have been answered.  Pre-operative antibiotics and Lovenox given.   Patient desires permanent sterilization.  Other reversible forms of contraception were discussed with patient; she declines all other modalities. Risks of procedure discussed with patient including but not limited to: risk of regret, permanence of method, bleeding, infection, injury to surrounding organs and need for additional procedures.  Failure risk of about 1% with increased risk of ectopic gestation if pregnancy occurs was also discussed with patient.  Also discussed possibility of post-tubal pain syndrome. Patient verbalized understanding of these risks and wants to proceed with sterilization.  Written informed consent obtained.  To OR when ready.   Rubie Maid, MD [facility]

## 2020-03-25 NOTE — Transfer of Care (Signed)
Immediate Anesthesia Transfer of Care Note  Patient: Michelle Osborn  Procedure(s) Performed: CESAREAN SECTION WITH BILATERAL TUBAL LIGATION (Bilateral )  Patient Location: PACU  Anesthesia Type:General  Level of Consciousness: awake, alert  and oriented  Airway & Oxygen Therapy: Patient Spontanous Breathing and Patient connected to face mask oxygen  Post-op Assessment: Report given to RN and Post -op Vital signs reviewed and stable  Post vital signs: Reviewed and stable  Last Vitals:  Vitals Value Taken Time  BP 122/78 03/25/20 0928  Temp 36.3 C 03/25/20 0928  Pulse    Resp 23 03/25/20 0928  SpO2 99 % 03/25/20 0928    Last Pain:  Vitals:   03/25/20 0928  TempSrc: Oral  PainSc: 0-No pain         Complications: No apparent anesthesia complications

## 2020-03-25 NOTE — Clinical Social Work Maternal (Signed)
CLINICAL SOCIAL WORK MATERNAL/CHILD NOTE  Patient Details  Name: Michelle Osborn MRN: [id number] Date of Birth: 12/05/1986  Date:  03/25/2020  Clinical Social Worker Initiating Note:  Oleh Genin, LCSW Date/Time: Initiated:  03/25/20/      Child's Name:  Michelle Osborn   Biological Parents:  Mother, Father   Need for Interpreter:  None   Reason for Referral:  Current Substance Use/Substance Use During Pregnancy    Address:  [place] [address]    Phone number:  [phone] (home)     Additional phone number: None reported  Household Members/Support Persons (HM/SP):   Household Member/Support Person 1, Household Member/Support Person 2, Household Member/Support Person 3, Household Member/Support Person 4, Household Member/Support Person 5, Household Member/Support Person 6   HM/SP Name Relationship DOB or Age  HM/SP -Michelle Osborn unknown  HM/SP -2 [place] child 7  HM/SP -3 Michelle Osborn child 2  HM/SP -Michelle Osborn. child 10 months  HM/SP -5 Michelle Osborn mother unknown  HM/SP -46 Michelle Osborn brother unknown  HM/SP -7        HM/SP -8          Natural Supports (not living in the home):  Extended Family, Friends, Immediate Family, Medical laboratory scientific officer, Firefighter Supports:     Employment: Unemployed   Type of Work:     Education:  High school graduate(GED)   Homebound arranged:    Museum/gallery curator Resources:  Medicaid   Other Resources:  Physicist, medical , Independence Considerations Which May Impact Care:  None reported  Strengths:  Ability to meet basic needs , Compliance with medical plan , Home prepared for child , Pediatrician chosen   Psychotropic Medications:         Pediatrician:    [employer]  Pediatrician List:   [place], [place])  [facility]      Pediatrician Fax  Number:    Risk Factors/Current Problems:  Substance Use    Cognitive State:  Alert , Able to Concentrate    Mood/Affect:  Calm , Happy    CSW Assessment: CSW consulted for drug screen being done on Baby due to positive drug screens during pregnancy (marijuana). CSW met with [name] at bedside. Explained CSW role and reason for referral. Michelle Osborn and that a CPS report must be made if Baby's screen is positive for any substances. [name] verbalized understanding. [name] reported she smokes marijuana and the last time she smoked was around 10 pm last night. [name] reported she has a Water engineer through [place]. CSW to monitor drug screen results and notify CPS if needed per policy.   [name] reported she is feeling "good" other than having some nausea after having Baby. [name] reported she feels she has a good support system including her cousin, mother, FOB, and other family members/friends. [name] reported she receives [facility] and [employer] and will notify her Workers of 33 birth.   Per chart review, [name] has a history of bipolar and anxiety. [name] reported this is due to childhood trauma. [name] stated she has seen counselors/therapists in the past and is aware of resources if she feels she needs mental health support in the future. [name] reported she has also taken Zoloft in the past, but did not like how it  made her feel. [name] reported she is going to talk with her MD about medication options while she is at the hospital. Overall, [name] reported she is coping well emotionally and denied additional mental health support needs at this time.   CSW provided education and information sheets regarding PPD and SIDS, [name] verbalized understanding and was encouraged to reach out to her MD with questions or needs for support.   [name] reported she has a new car seat, crib, is getting a bassinet, and has all other items needed for Baby when they get home. [name] reported she does not drive,  but has others who will provide transportation when it is needed.   CSW will ask weekend TOC to monitor drug screen and report to CPS if needed. No other needs/concerns identified at this time. CSW encouraged [name] to reach out if any needs arise.  CSW Plan/Description:  Sudden Infant Death Syndrome (SIDS) Education, Perinatal Mood and Anxiety Disorder (PMADs) Education, [place], CSW Will Continue to Monitor Umbilical Cord Tissue Drug Screen Results and Make Report if Michelle Giovanni, LCSW 03/25/2020, 2:56 PM

## 2020-03-25 NOTE — Op Note (Addendum)
Cesarean Section Procedure Note  Indications: prior C-section x 2, GDM (diet controlled), chronic HTN (no meds), declines trial of labor  Pre-operative Diagnosis: 39 week 1 day pregnancy, h/o prior C-section x 2 desiring repeat, H/o cHTN (no meds), Graves disease, DM type II (diet controlled), H/o MI (2017), h/o Bipolar disorder, tobacco abuse, mild intermittent asthma, obesity in pregnancy (BMI 32).  Desires permanent sterilization.   Post-operative Diagnosis: Same  Surgeon: Rubie Maid, MD  Assistants: Jeannie Fend, MD. An experienced assistant was required given the standard of surgical care given the complexity of the case.  This assistant was needed for exposure, dissection, suctioning, retraction, instrument exchange, and for overall help during the procedure.  Procedure: Repeat low transverse Cesarean Section with bilateral tubal ligation  Anesthesia: Spinal anesthesia  Findings: Female infant, cephalic presentation, 8185 grams, with Apgar scores of 8 at one minute and 8 at five minutes. Intact placenta with 3 vessel cord.  Meconium stained fluid at rupture.  The uterine outline, tubes and ovaries appeared normal.   Procedure Details: The patient was seen in the Holding Room. The risks, benefits, complications, treatment options, and expected outcomes were discussed with the patient.  The patient concurred with the proposed plan, giving informed consent.  The site of surgery properly noted/marked. The patient was taken to the Operating Room, identified as Jenan Ellegood and the procedure verified as C-Section Delivery. A Time Out was held and the above information confirmed.  After induction of anesthesia, the patient was draped and prepped in the usual sterile manner. Anesthesia was tested and noted to be adequate. A Pfannenstiel incision was made and carried down through the subcutaneous tissue to the fascia. Fascial incision was made and extended transversely. The fascia was  separated from the underlying rectus tissue superiorly and inferiorly. The peritoneum was identified and entered. Peritoneal incision was extended longitudinally. The surgical assist was able to provide retraction to allow for clear visualization of surgical site. An Alexis retractor was inserted, also used for enhanced visualization of the operative field. The utero-vesical peritoneal reflection was incised transversely and the bladder flap was bluntly freed from the lower uterine segment. A low transverse uterine incision was made. Delivered from cephalic presentation was a 3360 gram female with Apgar scores of 8 at one minute and 8 at five minutes.  The assistant was able to apply adequate fundal pressure to allow for successful delivery of the fetus. The umbilical cord was clamped and cut cord. The placenta was removed intact and appeared normal. The uterus was exteriorized and cleared of all clots and debris. The uterine outline, tubes and ovaries appeared normal.  The uterine incision was closed with a running locked suture of 0-Vicryl.  Hemostasis was observed.   Attention was then turned to the fallopian tubes, and where the patient's right fallopian tube was identified and grasped with a Babcock clamp.  The tube was then followed out to the fimbria.  The Babcock clamp was then used to grasp the tube approximately 4 cm from the cornual region.  A 3 cm segment of tube was then ligated with a free tie of 0-Chromic using the [place] method and excised.  The left fallopian tube was then ligated in a similar fashion and excised. The tubal lumens were cauterized bilaterally.  Good hemostasis was noted with bilateral fallopian tubes. The uterus was then returned to the abdomen.   The peri-colic gutters were cleared of all clots and debris. The fascia was then reapproximated with a running suture of  0- Vicryl. The fascia was injected with approximately 30 cc of 1.3% Exparel/saline solution. The subcutaneous fat  layer was reapproximated with 2-0 Vicryl. The previous C-section keloid scar was excised.  The remaining skin edges were underscored using the bovie.  Hemostasis was obtained.  The skin was reapproximated with 4-0 Monocryl.  The skin was then injected with an additional 30 cc of 1.3% Exparel/saline solution.  Steri-strips and a sterile bandage were applied to the incision.   Instrument, sponge, and needle counts were correct prior the abdominal closure and at the conclusion of the case. The patient tolerated the procedure well. She was taken to the recovery room in stable condition.    Estimated Blood Loss:  630 ml      Drains: foley catheter to gravity drainage, 100 ml clear urine at end of the procedure         Total IV Fluids:  1600 ml  Specimens: bilateral segments of fallopian tubes         Implants: None         Complications:  None; patient tolerated the procedure well.         Disposition: PACU - hemodynamically stable.         Condition: stable    Hildred Laser, MD [facility]

## 2020-03-25 NOTE — Anesthesia Preprocedure Evaluation (Addendum)
Anesthesia Evaluation  Patient identified by MRN, date of birth, ID band Patient awake    Reviewed: Allergy & Precautions, NPO status , Patient's Chart, lab work & pertinent test results  History of Anesthesia Complications Negative for: history of anesthetic complications  Airway Mallampati: II       Dental   Pulmonary asthma , COPD,  COPD inhaler, Current Smoker,           Cardiovascular hypertension, Pt. on medications + Past MI and + Cardiac Stents  (-) CHF (-) Valvular Problems/Murmurs     Neuro/Psych neg Seizures Anxiety Depression Bipolar Disorder    GI/Hepatic GERD  Medicated and Controlled,  Endo/Other  diabetes, Type 2, Oral Hypoglycemic AgentsHyperthyroidism   Renal/GU      Musculoskeletal   Abdominal   Peds  Hematology  (+) anemia ,   Anesthesia Other Findings   Reproductive/Obstetrics                            Anesthesia Physical Anesthesia Plan  ASA: III  Anesthesia Plan: Spinal   Post-op Pain Management:    Induction:   PONV Risk Score and Plan:   Airway Management Planned: Nasal Cannula  Additional Equipment:   Intra-op Plan:   Post-operative Plan:   Informed Consent: I have reviewed the patients History and Physical, chart, labs and discussed the procedure including the risks, benefits and alternatives for the proposed anesthesia with the patient or authorized representative who has indicated his/her understanding and acceptance.       Plan Discussed with:   Anesthesia Plan Comments:         Anesthesia Quick Evaluation

## 2020-03-25 NOTE — Lactation Note (Signed)
This note was copied from a baby's chart. Lactation Consultation Note  Patient Name: Michelle Osborn [id number] Date: 03/25/2020 Reason for consult: Initial assessment  First feeding observed by [facility]  Maternal Data Formula Feeding for Exclusion: No Does the patient have breastfeeding experience prior to this delivery?: Yes  Feeding Feeding Type: Breast Fed Baby latched easily and is nursing well with occ stimulation, occ swallow noted  LATCH Score Latch: Grasps breast easily, tongue down, lips flanged, rhythmical sucking.  Audible Swallowing: A few with stimulation  Type of Nipple: Everted at rest and after stimulation  Comfort (Breast/Nipple): Soft / non-tender  Hold (Positioning): No assistance needed to correctly position infant at breast.  LATCH Score: 9  Interventions Interventions: Skin to skin  Lactation Tools Discussed/Used WIC Program: Yes [facility] name and no written on white board  Consult Status Consult Status: PRN    [name] 03/25/2020, 5:43 PM

## 2020-03-26 ENCOUNTER — Encounter: Payer: Self-pay | Admitting: Obstetrics and Gynecology

## 2020-03-26 LAB — CREATININE, SERUM
Creatinine, Ser: 0.52 mg/dL (ref 0.44–1.00)
GFR calc Af Amer: >60 mL/min (ref >60)
GFR calc non Af Amer: >60 mL/min (ref >60)

## 2020-03-26 LAB — GLUCOSE, CAPILLARY
Glucose-Capillary: 71 mg/dL (ref 70–99)
Glucose-Capillary: 75 mg/dL (ref 70–99)
Glucose-Capillary: 71 mg/dL (ref 70–99)

## 2020-03-26 LAB — CBC
HCT: 27.4 % — ABNORMAL LOW (ref 36.0–46.0)
Hemoglobin: 8.9 g/dL — ABNORMAL LOW (ref 12.0–15.0)
MCH: 27.4 pg (ref 26.0–34.0)
MCHC: 32.5 g/dL (ref 30.0–36.0)
MCV: 84.3 fL (ref 80.0–100.0)
Platelets: 185 10*3/uL (ref 150–400)
RBC: 3.25 MIL/uL — ABNORMAL LOW (ref 3.87–5.11)
RDW: 13.9 % (ref 11.5–15.5)
WBC: 9 10*3/uL (ref 4.0–10.5)
nRBC: 0 % (ref 0.0–0.2)

## 2020-03-26 MED ORDER — SODIUM CHLORIDE 0.9 % IV SOLN
510.0000 mg | Freq: Once | INTRAVENOUS | Status: AC
  Administered 2020-03-26: 510 mg via INTRAVENOUS
  Filled 2020-03-26: qty 17

## 2020-03-26 NOTE — Progress Notes (Signed)
Pt called Rn to room, states she feels as if she is going to pass out. Reports she has had this feeling several times since this AM and is now afraid to hold her baby because she "doesn't want to pass out and drop him". Assessed pt for bleeding, incision appears WNL, no change from earlier assessment, VS stable. No abnormal findings to report. Pt stated she has had these episodes for about Per Dr. Valentino Saxon, continue to monitor, orders placed for iron infusion, may place further orders if symptoms persist. Pt agreeable, s[poke with Dr. Valentino Saxon. Asked pt to please call RN of any further episodes, plan to stand by assist for ambulation for now.

## 2020-03-26 NOTE — Anesthesia Post-op Follow-up Note (Signed)
  Anesthesia Pain Follow-up Note  Patient: Kelleigh Skerritt  Day #: 1  Date of Follow-up: 03/26/2020 Time: 9:41 AM  Last Vitals:  Vitals:   03/26/20 0500 03/26/20 0800  BP:  129/79  Pulse: 66   Resp:  17  Temp:  36.5 C  SpO2: 95%     Level of Consciousness: alert  Pain: mild   Side Effects:None  Catheter Site Exam:clean, dry  Anti-Coag Meds (From admission, onward)   Start     Dose/Rate Route Frequency Ordered Stop   03/26/20 1000  enoxaparin (LOVENOX) injection 40 mg     40 mg Subcutaneous Every 24 hours 03/25/20 1256         Plan: D/C from anesthesia care at surgeon's request  Cleda Mccreedy Erol Flanagin

## 2020-03-26 NOTE — Lactation Note (Signed)
This note was copied from a baby's chart. Lactation Consultation Note  Patient Name: Michelle Osborn [id number] Date: 03/26/2020   Observed mom breast feeding.  [name] latched without assistance and had strong, rhythmic suck with occasional swallow.  Mom was letting [name] get shallow latch at times which was pointed out to mom.  Mom did not break suction, but just pulled [name] off the breast when finished and then voiced that it hurt when she did it.  Reminded mom how to break suction by sliding finger in corner of mouth.  Mom reports breast feeding her 16 month old and 34 year old for 2 to 3 months.  Mom has been committed to putting [name] to the breast whenever he demonstrated feeding cues. Mom was positive for [facility] on admission.  Counseled mom on risks of continuing to smoke Marijuana and breast feeding.  Mom reports smoking Marijuana with other 2 babies and they were fine.  Reviewed normal newborn stomach size, supply and demand, normal course of lactation and routine newborn feeding patterns.  Lactation community resource hand out given with contact numbers and reviewed and lactation name and number written on white board and encouraged to call with any questions, concerns or assistance.  Maternal Data    Feeding Feeding Type: Breast Fed  LATCH Score                   Interventions    Lactation Tools Discussed/Used     Consult Status      Louis Meckel 03/26/2020, 6:40 PM

## 2020-03-26 NOTE — Progress Notes (Signed)
Pt reports she has had 2 'episodes' since 1pm today. At 4;30pm, she reports feeling dizzy and disoriented when rolling over in bed, but she reported that she recovered quickly. At shift change, she reports another one at 6;50pm that only lasted a few seconds, and 'felt like she was going to faint'. Pt stated she feels better now, has been tearful and anxious at times throughout the day, reports she was having issues with transportation home, but now says a 'friend is going to pick them up'. [place] survey completed, SW consult placed for score of 13. Will continue to monitor, oncoming shift RN made aware.

## 2020-03-26 NOTE — Progress Notes (Signed)
Postpartum Day # 1: Cesarean Delivery (repeat) with bilateral tubal ligation  Significant history includes: h/o prior C-section x 2 desiring repeat, H/o cHTN (no meds), Graves disease, DM type II (diet controlled), H/o MI (2017), h/o Bipolar disorder, tobacco abuse, mild intermittent asthma, obesity in pregnancy   Subjective: Patient denies  nausea, vomiting, incisional pain. She is tolerating PO, + flatus and has no problems voiding.    Objective: Vital signs in last 24 hours: Temp:  [97.6 F (36.4 C)-98 F (36.7 C)] 97.7 F (36.5 C) (03/27 0800) Pulse Rate:  [57-84] 66 (03/27 0500) Resp:  [11-23] 17 (03/27 0800) BP: (108-145)/(66-95) 129/79 (03/27 0800) SpO2:  [95 %-100 %] 95 % (03/27 0500)  Physical Exam:  General: alert and no distress Lungs: clear to auscultation bilaterally Breasts: normal appearance, no masses or tenderness Heart: regular rate and rhythm, S1, S2 normal, no murmur, click, rub or gallop Abdomen: soft, non-tender; bowel sounds normal; no masses,  no organomegaly Pelvis: Lochia appropriate, Uterine Fundus firm, Incision: healing well, no significant drainage, no dehiscence, no significant erythema Extremities: DVT Evaluation: No evidence of DVT seen on physical exam. Negative Homan's sign. No cords or calf tenderness. No significant calf/ankle edema.  Recent Labs    03/23/20 1247 03/26/20 0639  HGB 11.0* 8.9*  HCT 33.1* 27.4*    Assessment/Plan: Status post Cesarean section. Doing well postoperatively.  Plan for discharge tomorrow Continue PO pain management.  Mild anemia postpartum, s/p surgical blood loss. Asymptomatic. Will treat with PO iron.  Continue Accuchecks 4x daily.  DM diet. Continue Labetalol 100 mg BID Continue current care.  Hildred Laser, MD [facility]

## 2020-03-26 NOTE — Anesthesia Postprocedure Evaluation (Signed)
Anesthesia Post Note  Patient: Michelle Osborn  Procedure(s) Performed: CESAREAN SECTION WITH BILATERAL TUBAL LIGATION (Bilateral )  Patient location during evaluation: Mother Baby Anesthesia Type: Spinal Level of consciousness: awake and alert Pain management: pain level controlled Vital Signs Assessment: post-procedure vital signs reviewed and stable Respiratory status: spontaneous breathing, nonlabored ventilation and respiratory function stable Cardiovascular status: stable Postop Assessment: no headache, no backache, patient able to bend at knees and able to ambulate Anesthetic complications: no     Last Vitals:  Vitals:   03/26/20 0500 03/26/20 0800  BP:  129/79  Pulse: 66   Resp:  17  Temp:  36.5 C  SpO2: 95%     Last Pain:  Vitals:   03/26/20 0800  TempSrc: Oral  PainSc:                  Cleda Mccreedy [place]

## 2020-03-27 DIAGNOSIS — O9081 Anemia of the puerperium: Secondary | ICD-10-CM | POA: Diagnosis not present

## 2020-03-27 MED ORDER — OXYCODONE HCL 5 MG PO TABS: 5.0000 mg | ORAL_TABLET | ORAL | 0 refills | Status: DC | PRN

## 2020-03-27 MED ORDER — IBUPROFEN 600 MG PO TABS: 600.0000 mg | ORAL_TABLET | Freq: Three times a day (TID) | ORAL | 0 refills | Status: DC | PRN

## 2020-03-27 NOTE — Progress Notes (Signed)
TOC CM/SW received social work consult CSW consulted with mother of Michelle Osborn regarding positive drug screen that was done on [employer].  CSW explained the reason for referral and discussed that a CPS report will be made to [facility] DSS due to baby's positive drug screen.   Patient reminded CSW that she has been working with a DSS social worker in [facility] Mrs. Lona Millard.   Provided this patient with psychoeducation on substance use and encouraged her to follow-up with community resources provided.   Child's Name:  Michelle Osborn   Biological Parents:  Mother, Father   Need for Interpreter:  None   Reason for Referral:  Baby tested positive for marijuana.    Address:  [address] [place] [address]    Phone number:  [phone] (home)     Additional phone number: None reported  Household Members/Support Persons (HM/SP):   Father of Baby

## 2020-03-27 NOTE — Progress Notes (Signed)
Postpartum Day # 2: Cesarean Delivery (repeat) with bilateral tubal ligation  Significant history includes: h/o prior C-section x 2 desiring repeat, H/o cHTN (no meds), Graves disease, DM type II (diet controlled), H/o MI (2017), h/o Bipolar disorder, tobacco abuse, mild intermittent asthma, obesity in pregnancy   Subjective: Patient denies  nausea, vomiting, incisional pain. She is tolerating PO, + flatus and has no problems voiding.  Breastfeeding is going well. She does report 2 "dizzy" episodes yesterday while at rest, feeling like she was going to black out or pass out. Episodes are temporary, lasting only a few seconds. Had issues with these over the past 4 months of her pregnancy.   Objective: Vital signs in last 24 hours: Temp:  [97.8 F (36.6 C)-98.5 F (36.9 C)] 97.8 F (36.6 C) (03/28 0722) Pulse Rate:  [61-96] 68 (03/28 0722) Resp:  [17-20] 18 (03/28 0722) BP: (111-138)/(68-88) 138/84 (03/28 0722) SpO2:  [97 %-100 %] 100 % (03/28 0722)  Physical Exam:  General: alert and no distress Lungs: clear to auscultation bilaterally Breasts: normal appearance, no masses or tenderness Heart: regular rate and rhythm, S1, S2 normal, no murmur, click, rub or gallop Abdomen: soft, non-tender; bowel sounds normal; no masses,  no organomegaly Pelvis: Lochia appropriate, Uterine Fundus firm, Incision: healing well, no significant drainage, no dehiscence, no significant erythema. Dressing dry, intact Extremities: DVT Evaluation: No evidence of DVT seen on physical exam. Negative Homan's sign. No cords or calf tenderness. No significant calf/ankle edema.  . Assessment/Plan: Status post Cesarean section. Doing well postoperatively.  Continue PO pain management.  Mild anemia postpartum, s/p surgical blood loss. Treated with IV Fereheme due to possible symptoms yesterday. Will continue to treat with PO iron.  Continue Accuchecks 4x daily.  DM diet. Continue Labetalol 100 mg BID.  Will  initiate back on Methimazole for Grave's disease. Will need to f/u with Endocrinologist postpartum.  Patient's near syncopal or "black out" episodes have been worked up by Cardiology with negative findings during the pregnancy. May need to consider Neurology consult to rule out absence seizures.  Tubal ligation for contraception.  Patient plans to breastfeed.  Discharge home  Hildred Laser, MD [facility]

## 2020-03-27 NOTE — Progress Notes (Signed)
Reviewed D/C instructions with pt and family. Pt verbalized understanding of teaching. Discharged to home via W/C. Pt to schedule f/u appt.  

## 2020-03-27 NOTE — Social Work (Signed)
TOC CM/SW consult received.  Assessment in progress.    Larwance Rote, MSW, LCSW  [phone] 8am-6pm (

## 2020-03-27 NOTE — Discharge Summary (Signed)
Postpartum Discharge Summary     Patient Name: Michelle Osborn DOB: 1986-05-20 MRN: [id number]  Date of admission: 03/25/2020 Delivering Provider: Rubie Maid   Date of discharge: 03/27/2020  Admitting diagnosis: History of Cesarean section x 2 Intrauterine pregnancy: [redacted]w[redacted]d    Secondary diagnosis:  Active Problems:   Essential hypertension   Graves disease   History of cesarean section   Bipolar 2 disorder ([place]   Mild intermittent asthma without complication   Type 2 diabetes mellitus without complication, without long-term current use of insulin (HCC)   History of pre-eclampsia   History of heart attack   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 yo G3P3003 at 340w1das admitted to the hospital 03/25/2020 for scheduled cesarean section with the following indication:Elective Repeat.  Membrane Rupture Time/Date: 8:33 AM ,03/25/2020   Patient delivered a Viable female infant.03/25/2020.  Details of operation can be found in separate operative note.  Pateint had an uncomplicated postpartum course.  She is ambulating, tolerating a regular diet, passing flatus, and urinating well. Patient is discharged home in stable condition on  03/27/20         Magnesium Sulfate received: No BMZ received: No Rhophylac:N/A MMR:N/A Transfusion:Yes - IV Iron infusion  Physical exam  Vitals:   03/26/20 2309 03/27/20 0053 03/27/20 0240 03/27/20 0722  BP: 118/72 120/68 128/72 138/84  Pulse: 96 62 96 68  Resp:  18  18  Temp: 98.4 F (36.9 C) 98.2 F  (36.8 C) 98.4 F (36.9 C) 97.8 F (36.6 C)  TempSrc: Oral Oral Oral Oral  SpO2: 97% 99% 100% 100%  Weight:      Height:       General: alert and no distress Lochia: appropriate Uterine Fundus: firm Incision: Healing well with no significant drainage, No significant erythema, Dressing is clean, dry, and intact DVT Evaluation: No evidence of DVT seen on physical exam. Negative Homan's sign. No cords or calf tenderness. No significant calf/ankle edema. Labs: Lab Results  Component Value Date   WBC 9.0 03/26/2020   HGB 8.9 (L) 03/26/2020   HCT 27.4 (L) 03/26/2020   MCV 84.3 03/26/2020   PLT 185 03/26/2020   CMP Latest Ref Rng & Units 03/26/2020  Glucose 65 - 99 mg/dL -  BUN 6 - 20 mg/dL -  Creatinine 0.44 - 1.00 mg/dL 0.52  Sodium 134 - 144 mmol/L -  Potassium 3.5 - 5.2 mmol/L -  Chloride 96 - 106 mmol/L -  CO2 20 - 29 mmol/L -  Calcium 8.7 - 10.2 mg/dL -  Total Protein 6.0 - 8.5 g/dL -  Total Bilirubin  0.0 - 1.2 mg/dL -  Alkaline Phos 39 - 117 IU/L -  AST 0 - 40 IU/L -  ALT 0 - 32 IU/L -   Edinburgh Score: Edinburgh Postnatal Depression Scale Screening Tool 03/25/2020  I have been able to laugh and see the funny side of things. (No Data)  I have looked forward with enjoyment to things. -  I have blamed myself unnecessarily when things went wrong. -  I have been anxious or worried for no good reason. -  I have felt scared or panicky for no good reason. -  Things have been getting on top of me. -  I have been so unhappy that I have had difficulty sleeping. -  I have felt sad or miserable. -  I have been so unhappy that I have been crying. -  The thought of harming myself has occurred to me. [name] Postnatal Depression Scale Total -   Edinburgh score reported and documented to be 13 by nurse, Social Work consultation entered on PPD#1.   Discharge instruction: per After Visit Summary   After visit meds:  Allergies as of 03/27/2020      Reactions   Darvocet  [propoxyphene N-acetaminophen] Anaphylaxis   Also makes her stomach hurt   Peanut-containing Drug Products Shortness Of Breath, Swelling   Penicillins Shortness Of Breath, Swelling   Did it involve swelling of the face/tongue/throat, SOB, or low BP? Yes Did it involve sudden or severe rash/hives, skin peeling, or any reaction on the inside of your mouth or nose? No Did you need to seek medical attention at a hospital or doctor's office? Yes When did it last happen?2007 If all above answers are "NO", may proceed with cephalosporin use.   Cephalexin Hives, Itching   Toradol [ketorolac Tromethamine] Rash   Red rash with bumps   Tramadol Nausea And Vomiting      Medication List    STOP taking these medications   Diclegis 10-10 MG Tbec Generic drug: Doxylamine-Pyridoxine   EPINEPHrine 0.3 mg/0.3 mL Soaj injection Commonly known as: EPI-PEN   nitrofurantoin (macrocrystal-monohydrate) 100 MG capsule Commonly known as: MACROBID   oxyCODONE-acetaminophen 5-325 MG tablet Commonly known as: Percocet   promethazine 25 MG tablet Commonly known as: PHENERGAN   sulfamethoxazole-trimethoprim 800-160 MG tablet Commonly known as: BACTRIM DS     TAKE these medications   ibuprofen 600 MG tablet Commonly known as: ADVIL Take 1 tablet (600 mg total) by mouth every 8 (eight) hours as needed for mild pain.   labetalol 100 MG tablet Commonly known as: NORMODYNE Take 100 mg by mouth 2 (two) times daily.   oxyCODONE 5 MG immediate release tablet Commonly known as: Oxy IR/ROXICODONE Take 1-2 tablets (5-10 mg total) by mouth every 4 (four) hours as needed for moderate pain.   pantoprazole 20 MG tablet Commonly known as: Protonix Take 1 tablet (20 mg total) by mouth 2 (two) times daily before a meal.   prenatal multivitamin Tabs tablet Take 1 tablet by mouth daily at 12 noon.       Diet: carb modified diet  Activity: Advance as tolerated. Pelvic rest for 6 weeks.   Outpatient  follow up:1 week for incision check Follow up Appt:No future appointments.     Anticipated Birth Control:  BTL done PP Schedule Integrated BH visit: no    Newborn Data: Live born female  Birth Weight: 7 lb 6.5 oz (3360 g) APGAR: 8, 8  Newborn Delivery   Birth date/time: 03/25/2020 08:34:00  Delivery type: C-Section, Low Transverse Trial of labor: No C-section categorization: Repeat      Baby Feeding: Breast Disposition:home with mother   03/27/2020 Rubie Maid, MD

## 2020-03-28 LAB — SURGICAL PATHOLOGY

## 2020-04-14 ENCOUNTER — Ambulatory Visit (INDEPENDENT_AMBULATORY_CARE_PROVIDER_SITE_OTHER): Payer: Medicaid Other | Admitting: Obstetrics and Gynecology

## 2020-04-14 ENCOUNTER — Other Ambulatory Visit: Payer: Self-pay

## 2020-04-14 ENCOUNTER — Encounter: Payer: Self-pay | Admitting: Obstetrics and Gynecology

## 2020-04-14 VITALS — BP 136/92 | HR 91 | Ht 67.0 in | Wt 184.8 lb

## 2020-04-14 DIAGNOSIS — Z5189 Encounter for other specified aftercare: Secondary | ICD-10-CM

## 2020-04-14 DIAGNOSIS — E0500 Thyrotoxicosis with diffuse goiter without thyrotoxic crisis or storm: Secondary | ICD-10-CM

## 2020-04-14 DIAGNOSIS — Z98891 History of uterine scar from previous surgery: Secondary | ICD-10-CM

## 2020-04-14 DIAGNOSIS — I10 Essential (primary) hypertension: Secondary | ICD-10-CM

## 2020-04-14 NOTE — Progress Notes (Signed)
Pt present for incision check. Pt stated that she noticed her left incision area open and had an odor. Pt stated that she has been keeping the area clean and dry. No other complaints.

## 2020-04-14 NOTE — Progress Notes (Signed)
    OBSTETRICS/GYNECOLOGY POST-OPERATIVE CLINIC VISIT  Subjective:     Michelle Osborn is a 34 y.o. female who presents to the clinic 1 weeks status post repeat C-section with BTL for history of prior C-section x 2, undesired fertility. Eating a regular diet without difficulty. Bowel movements are normal. The patient is not having any pain. She does report an odor from her incision.   The following portions of the patient's history were reviewed and updated as appropriate: allergies, current medications, past family history, past medical history, past social history, past surgical history and problem list.  Review of Systems Pertinent items noted in HPI and remainder of comprehensive ROS otherwise negative.    Objective:    BP (!) 136/92   Pulse 91   Ht 5\' 7"  (1.702 m)   Wt 184 lb 12.8 oz (83.8 kg)   LMP 04/10/2020   Breastfeeding Yes   BMI 28.94 kg/m  General:  alert and no distress  Abdomen: soft, bowel sounds active, non-tender  Incision:   healing well, no drainage, no erythema, no hernia, no seroma, no swelling, no dehiscence, incision well approximated. No significant odor appreciated.    Pathology:  DIAGNOSIS:  A. FALLOPIAN TUBE SEGMENT, LEFT; LIGATION:  - COMPLETE CROSS-SECTIONS OF UNREMARKABLE FALLOPIAN TUBE.  - NEGATIVE FOR MALIGNANCY.   B. FALLOPIAN TUBE SEGMENT, RIGHT; LIGATION:  - COMPLETE CROSS-SECTIONS OF UNREMARKABLE FALLOPIAN TUBE.  - NEGATIVE FOR MALIGNANCY.   Assessment:   1. Visit for wound check   2. S/P cesarean section   3. Graves disease   4. Chronic hypertension     Plan:   1. Continue any current medications. 2. Wound care discussed.  Advised that she can use hydrogen peroxide occasionally at incision site to control odor, and use of small cloth to abdomen to decrease moisture of pannus.  3. Pathology report discussed.  Confirms tubal ligation.  4. Activity restrictions: no bending, stooping, or squatting and no lifting more than 15  pounds 5. Anticipated return to work: 5 weeks if applicable. 6. CHTN, currently on Labetalol. Notes compliance with medication.  7. Grave's disease. Encouraged patient to f/u with Endocrinologist now that she is no longer pregnant.  8. Follow up: 5 weeks for postpartum visit.     06/10/2020, MD [facility]

## 2020-04-18 ENCOUNTER — Encounter: Payer: Self-pay | Admitting: Obstetrics and Gynecology

## 2020-05-20 ENCOUNTER — Telehealth: Payer: Self-pay | Admitting: Obstetrics and Gynecology

## 2020-05-20 NOTE — Telephone Encounter (Signed)
Called patient to give her the information on the brochures that was provided by the nurse- Patient said she had already spoken to them but they wont do circumcisions after 76 weeks of age. Patient also wanted me to inform nurse that she is on her second period since having her child. Patient said the first period lasted 3 days and this one has been 14 days long. Patient stated she wasn't having any abdominal cramps or anything like that but it is concerning for the patient.

## 2020-05-20 NOTE — Telephone Encounter (Signed)
Patient wants advise on where she can bring her children to see someone who will do a circumcision as Medicaid is now covering circumcision. Everywhere she goes she says they wont do them if they are older than 15 weeks old. She just wanted to know if her provider had any recommendations on where she could bring her children.

## 2020-05-24 NOTE — Telephone Encounter (Signed)
Pt called no answer LM via VM to call the office to speak more about her concerns for calling the office.  

## 2020-05-25 NOTE — Telephone Encounter (Signed)
Please inform patient that [facility] will still perform circumcisions beyond 3 weeks, but it may cost a little more (usually performed by Pediatric Urology)  Dr. Valentino Saxon

## 2020-05-25 NOTE — Telephone Encounter (Signed)
Pt stated stating that she was trying to take her son somewhere to get circumcised. Pt stated that has tired the circumcision clinic and they said no. Do you know of any other places that would do circumcisions after 3 weeks. Thanks [employer]

## 2020-05-26 NOTE — Telephone Encounter (Signed)
Pt stated having off and on vaginal bleeding for a month now. Pt is wondering what she could do to help decrease the bleeding due to she is not able to come in to the office for an appointment anytime soon. Please advise. Thanks [employer]

## 2020-05-26 NOTE — Telephone Encounter (Signed)
Pt is aware that she could take her boys to [facility] and pt stated that she having of irregular bleeding for about a month now.

## 2020-05-27 ENCOUNTER — Other Ambulatory Visit: Payer: Self-pay

## 2020-05-27 MED ORDER — NORETHINDRONE 0.35 MG PO TABS: 1.0000 | ORAL_TABLET | Freq: Every day | ORAL | 0 refills | Status: DC

## 2020-05-27 NOTE — Telephone Encounter (Signed)
Her bleeding may be due to several different things, could be due to her thyroid disease, or from the pregnancy itself as she is still postpartum.  She can try a month of Micronor (we can send this in), and if it doesn't improve, she may need to get back on a her thyroid medication ( the PTU, propylthiouracil).  Dr. Valentino Saxon

## 2020-05-27 NOTE — Telephone Encounter (Signed)
Pt called no answer LM via VM that I would send information given by [facility] via mychart for the reason for her call to the office. Did not want to LM via VM due to no name was displayed for VM. Will send pt a mychart message.

## 2020-06-14 NOTE — Telephone Encounter (Signed)
Pt called no answer LM via VM for pt to call the office to go over information given by [facility] or reply to [place] message sent to her.

## 2020-06-20 NOTE — Telephone Encounter (Signed)
Pt called to cancel her appt with [place] on 01/15/19. Said, she has no transportation. Encouraged pt to call her Medicaid Caseworker. Pt said that would not work bc she is no longer in [place] and they won't help her with transportation.    Incoming call

## 2020-06-22 NOTE — Telephone Encounter (Signed)
Pt called no answer LM via VM to call the office to go over the information given by [facility] concerning her call to the office. Pt was advised that she could reply via mychart if that was better for her. Sent pt a [employer] geologist.

## 2020-06-22 NOTE — Telephone Encounter (Deleted)
Her bleeding may be due to several different things, could be due to her thyroid disease, or from the pregnancy itself as she is still postpartum.  She can try a month of Micronor (we can send this in), and if it doesn't improve, she may need to get back on a her thyroid medication ( the PTU, propylthiouracil).  Dr. Cherry 

## 2020-06-30 NOTE — Telephone Encounter (Signed)
Please see my chart messages

## 2020-08-29 ENCOUNTER — Ambulatory Visit
Admission: EM | Admit: 2020-08-29 | Discharge: 2020-08-29 | Disposition: A | Discharge status: Home / Self Care | Payer: Medicaid Other | Attending: Emergency Medicine | Admitting: Emergency Medicine

## 2020-08-29 DIAGNOSIS — L02412 Cutaneous abscess of left axilla: Secondary | ICD-10-CM | POA: Diagnosis not present

## 2020-08-29 MED ORDER — IBUPROFEN 800 MG PO TABS
800.0000 mg | ORAL_TABLET | Freq: Once | ORAL | Status: AC
  Administered 2020-08-29: 800 mg via ORAL

## 2020-08-29 MED ORDER — DOXYCYCLINE HYCLATE 100 MG PO CAPS: 100.0000 mg | ORAL_CAPSULE | Freq: Two times a day (BID) | ORAL | 0 refills | Status: AC

## 2020-08-29 MED ORDER — IBUPROFEN 800 MG PO TABS: 800.0000 mg | ORAL_TABLET | Freq: Three times a day (TID) | ORAL | 0 refills | Status: DC

## 2020-08-29 NOTE — ED Triage Notes (Signed)
Pt c/o abscess under lt arm x5 days, used hot compresses with no relief. Denies drainage.

## 2020-08-29 NOTE — Discharge Instructions (Addendum)
Please begin doxycycline for 10 days  Packing removal in 24-48 hours  Apply warm compresses/hot rags to area with massage to express further drainage especially the first 24-48 hours  Return if symptoms returning or not improving 

## 2020-08-30 NOTE — ED Provider Notes (Signed)
[facility]    CSN: [id number] Arrival date & time: 08/29/20  1757      History   Chief Complaint Chief Complaint  Patient presents with  . Abscess    HPI Michelle Osborn is a 34 y.o. female presenting today for evaluation of an abscess.  Patient reports that she has had an abscess to her left axilla for the past 5 days.  Increasingly more swollen and painful.  Has been using hot compresses without relief.  Denies spontaneous drainage.  Has had prior surgery to remove sweat glands.  Has not had an abscess in a while.  Denies fevers.  HPI  Past Medical History:  Diagnosis Date  . Anemia   . Anginal pain (HCC)    on and off.   . Anxiety   . Asthma    "grew out' not since teenager  . Bipolar 1 disorder (HCC)   . Depression   . Diabetes mellitus    not currently on meds, normal A1c  . Discoloration of skin of foot 02/2020   pt wakes up with purple feet every am. it dissipates as day goes on.  . Hydradenitis   . Hypertension   . Hyperthyroidism   . Infection    UTI  . MI (myocardial infarction) (HCC) 2017  . Neuropathy   . Syncope 02/2020   encouraged to hydrate and have snacks nearby. has passed out. happens several x a week.  . Tachycardia   . Thyroid disease     Patient Active Problem List   Diagnosis Date Noted  . Postpartum anemia 03/27/2020  . Keloid scar of skin 03/25/2020  . S/P cesarean section 03/25/2020  . Chronic hypertension in pregnancy 12/17/2019  . UTI (urinary tract infection) during pregnancy 09/14/2019  . Back pain affecting pregnancy in third trimester 09/14/2019  . Low TSH level 09/09/2019  . Chest pain 08/30/2019  . History of pre-eclampsia 05/01/2019  . Supervision of high risk pregnancy, antepartum 01/16/2019  . Diabetes mellitus complicating pregnancy 01/16/2019  . 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 Tachycardia 01/09/2019  . Mixed hyperlipidemia 01/09/2019  .  Hyperthyroidism affecting pregnancy in second trimester 01/07/2019  . Lead exposure 12/06/2018  . Smoker 12/06/2018  . Bipolar 2 disorder (HCC) 12/06/2018  . History of anxiety 12/06/2018  . Mild intermittent asthma without complication 12/06/2018  . Type 2 diabetes mellitus without complication, without long-term current use of insulin (HCC) 12/06/2018  . High-risk pregnancy in second trimester 12/06/2018  . History of heart attack 12/06/2018  . Graves disease 10/30/2018  . History of cesarean section 10/30/2018  . Nausea/vomiting in pregnancy 10/30/2018  . History of marijuana use 10/30/2018  . HYPERCHOLESTEROLEMIA 12/08/2009  . HIDRADENITIS SUPPURATIVA 12/08/2009  . VITAMIN D DEFICIENCY 11/18/2009  . GERD 11/18/2009  . MENORRHAGIA 11/18/2009  . Diabetes mellitus type 2 with complications (HCC) 11/17/2009  . Essential hypertension 11/17/2009  . ASTHMA 11/17/2009    Past Surgical History:  Procedure Laterality Date  . ADENOIDECTOMY    . APPENDECTOMY    . CARDIAC CATHETERIZATION  03/2016   [place], [place]   . CESAREAN SECTION    . CESAREAN SECTION N/A 05/05/2019   Procedure: REPEAT CESAREAN SECTION;  Surgeon: Linzie Collin, MD;  Location: [facility];  Service: Obstetrics;  Laterality: N/A;  . CESAREAN SECTION WITH BILATERAL TUBAL LIGATION Bilateral 03/25/2020   Procedure: CESAREAN SECTION WITH BILATERAL TUBAL LIGATION;  Surgeon: Hildred Laser, MD;  Location: [facility];  Service: Obstetrics;  Laterality: Bilateral;  Repeat  . CORONARY ANGIOPLASTY WITH STENT PLACEMENT  03/2016   2 stents  . DILATION AND CURETTAGE OF UTERUS  2014  . EXCISION OF HYDRADENITIS  2009, 2012   REMOVAL OF SWEAT GLANDS  . OTHER SURGICAL HISTORY     sweat gland excision  . TONSILLECTOMY      OB History    Gravida  3   Para  3   Term  3   Preterm      AB      Living  3     SAB      TAB      Ectopic      Multiple  0   Live Births  3            Home Medications    Prior to  Admission medications   Medication Sig Start Date End Date Taking? Authorizing Provider  doxycycline (VIBRAMYCIN) 100 MG capsule Take 1 capsule (100 mg total) by mouth 2 (two) times daily for 10 days. 08/29/20 09/08/20  Cece Milhouse C, PA-C  ibuprofen (ADVIL) 800 MG tablet Take 1 tablet (800 mg total) by mouth 3 (three) times daily. 08/29/20   Lissy Deuser, Junius Creamer, PA-C    Family History Family History  Problem Relation Age of Onset  . Hypertension Mother   . Asthma Mother   . Diabetes Mother   . Hyperlipidemia Mother   . Heart disease Mother   . Heart failure Mother        Defib placement  . Hypertension Father   . Kidney disease Father   . Heart disease Father   . Cancer Maternal Aunt   . Cancer Maternal Grandmother     Social History Social History   Tobacco Use  . Smoking status: Current Every Day Smoker    Packs/day: 0.25    Years: 10.00    Pack years: 2.50    Types: Cigarettes  . Smokeless tobacco: Never Used  . Tobacco comment: maybe 3 a day  Vaping Use  . Vaping Use: Never used  Substance Use Topics  . Alcohol use: No  . Drug use: Not Currently    Types: Marijuana    Comment: uses to enhance appetite & to handle nausea     Allergies   Darvocet [propoxyphene n-acetaminophen], Peanut-containing drug products, Penicillins, Cephalexin, Toradol [ketorolac tromethamine], and Tramadol   Review of Systems Review of Systems  Constitutional: Negative for fatigue and fever.  Eyes: Negative for visual disturbance.  Respiratory: Negative for shortness of breath.   Cardiovascular: Negative for chest pain.  Gastrointestinal: Negative for abdominal pain, nausea and vomiting.  Musculoskeletal: Negative for arthralgias and joint swelling.  Skin: Positive for color change. Negative for rash and wound.  Neurological: Negative for dizziness, weakness, light-headedness and headaches.     Physical Exam Triage Vital Signs ED Triage Vitals  Enc Vitals Group     BP 08/29/20  2007 (!) 145/88     Pulse Rate 08/29/20 2007 100     Resp 08/29/20 2007 18     Temp 08/29/20 2007 99.5 F (37.5 C)     Temp Source 08/29/20 2007 Oral     SpO2 08/29/20 2007 98 %     Weight --      Height --      Head Circumference --      Peak Flow --      Pain Score 08/29/20 2009 10     Pain Loc --  Pain Edu? --      Excl. in GC? --    No data found.  Updated Vital Signs BP (!) 145/88 (BP Location: Left Arm)   Pulse 100   Temp 99.5 F (37.5 C) (Oral)   Resp 18   LMP 08/22/2020   SpO2 98%   Breastfeeding No   Visual Acuity Right Eye Distance:   Left Eye Distance:   Bilateral Distance:    Right Eye Near:   Left Eye Near:    Bilateral Near:     Physical Exam Vitals and nursing note reviewed.  Constitutional:      Appearance: She is well-developed.     Comments: No acute distress  HENT:     Head: Normocephalic and atraumatic.     Nose: Nose normal.  Eyes:     Conjunctiva/sclera: Conjunctivae normal.  Cardiovascular:     Rate and Rhythm: Normal rate.  Pulmonary:     Effort: Pulmonary effort is normal. No respiratory distress.  Abdominal:     General: There is no distension.  Musculoskeletal:        General: Normal range of motion.     Cervical back: Neck supple.     Comments: Full active range of motion of left shoulder although does elicit pain  Skin:    General: Skin is warm and dry.     Comments: Large fluctuant 5 cm abscess to left axilla with surrounding induration, significantly tender  Neurological:     Mental Status: She is alert and oriented to person, place, and time.      UC Treatments / Results  Labs (all labs ordered are listed, but only abnormal results are displayed) Labs Reviewed - No data to display  EKG   Radiology No results found.  Procedures Incision and Drainage  Date/Time: 08/29/2020 8:15 PM Performed by: Roxanna Mcever, Iaeger C, PA-C Authorized by: Caydence Enck, Junius Creamer, PA-C   Consent:    Consent obtained:  Verbal    Consent given by:  Patient   Risks discussed:  Bleeding, incomplete drainage and pain   Alternatives discussed:  Alternative treatment Location:    Type:  Abscess   Size:  5 cm   Location: L axilla. Pre-procedure details:    Skin preparation:  Betadine Anesthesia (see MAR for exact dosages):    Anesthesia method:  Local infiltration   Local anesthetic:  Lidocaine 2% w/o epi Procedure type:    Complexity:  Simple Procedure details:    Needle aspiration: no     Incision types:  Single straight   Incision depth:  Subcutaneous   Scalpel blade:  11   Wound management:  Probed and deloculated   Drainage:  Bloody and purulent   Drainage amount:  Copious   Wound treatment:  Wound left open   Packing materials:  1/2 in iodoform gauze Post-procedure details:    Patient tolerance of procedure:  Tolerated well, no immediate complications   (including critical care time)  Medications Ordered in UC Medications  ibuprofen (ADVIL) tablet 800 mg (800 mg Oral Given 08/29/20 2048)    Initial Impression / Assessment and Plan / UC Course  I have reviewed the triage vital signs and the nursing notes.  Pertinent labs & imaging results that were available during my care of the patient were reviewed by me and considered in my medical decision making (see chart for details).     I&D performed with significant drainage, packing placed.  Packing removal in 24 to 48 hours, initiated on  doxycycline, warm compresses.  Monitor for gradual resolution.  Low-grade fever today, Tylenol and ibuprofen for pain.  Discussed strict return precautions. Patient verbalized understanding and is agreeable with plan.  Final Clinical Impressions(s) / UC Diagnoses   Final diagnoses:  Abscess of left axilla     Discharge Instructions     Please begin doxycycline for 10 days Packing removal in 24-48 hours Apply warm compresses/hot rags to area with massage to express further drainage especially the first 24-48  hours Return if symptoms returning or not improving    ED Prescriptions    Medication Sig Dispense Auth. Provider   ibuprofen (ADVIL) 800 MG tablet Take 1 tablet (800 mg total) by mouth 3 (three) times daily. 21 tablet Traver Meckes C, PA-C   doxycycline (VIBRAMYCIN) 100 MG capsule Take 1 capsule (100 mg total) by mouth 2 (two) times daily for 10 days. 20 capsule Ketzaly Cardella, Congress C, PA-C     PDMP not reviewed this encounter.   Laasya Peyton, Aloha C, PA-C 08/30/20 1016

## 2020-12-19 IMAGING — US US OB COMP LESS 14 WK
1 series · 15 of 19 positions shown · non-contrast
Comparison: None.

CLINICAL DATA: Pelvic pain

EXAM:
OBSTETRIC <14 WK ULTRASOUND
TECHNIQUE: Transabdominal ultrasound was performed for evaluation of the
gestation as well as the maternal uterus and adnexal regions.

[Series 1: us ob comp less 14 wk · 15 of 19 slices shown]
[im 1/19]
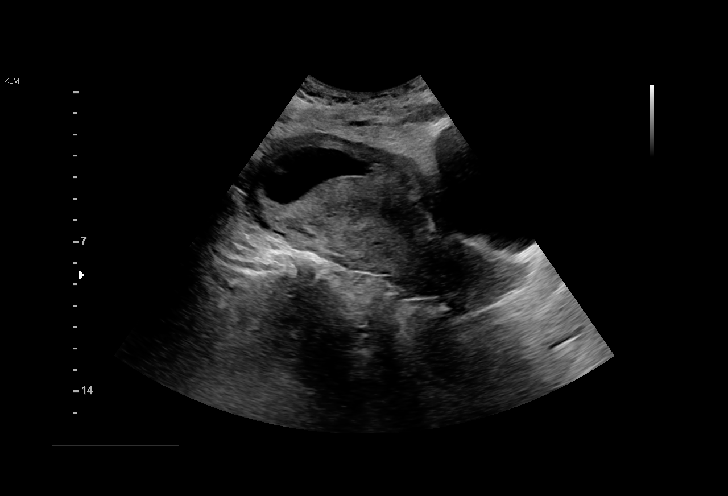
[im 2/19]
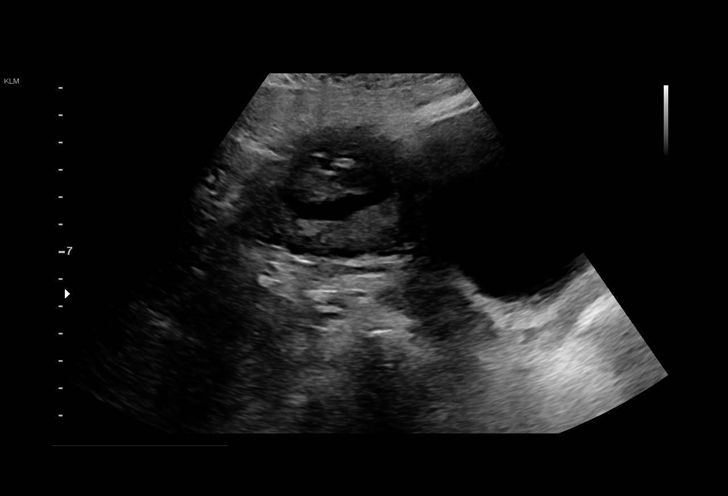
[im 4/19]
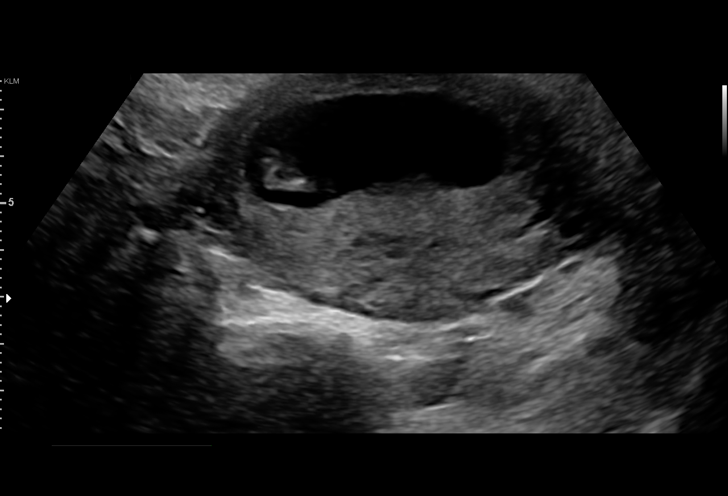
[im 5/19]
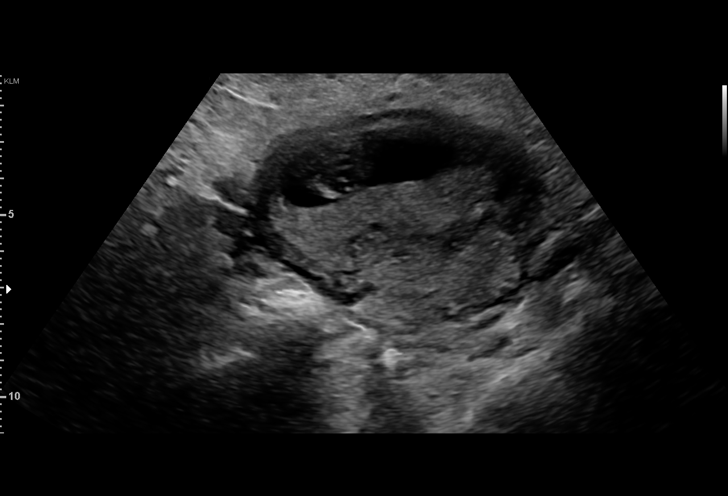
[im 6/19]
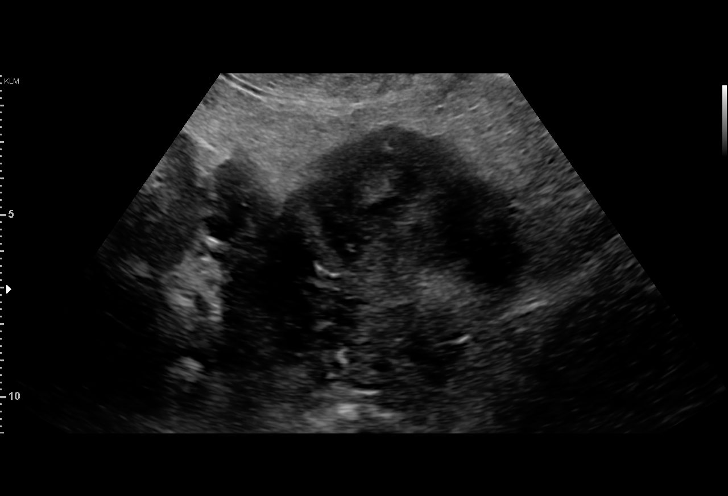
[im 7/19]
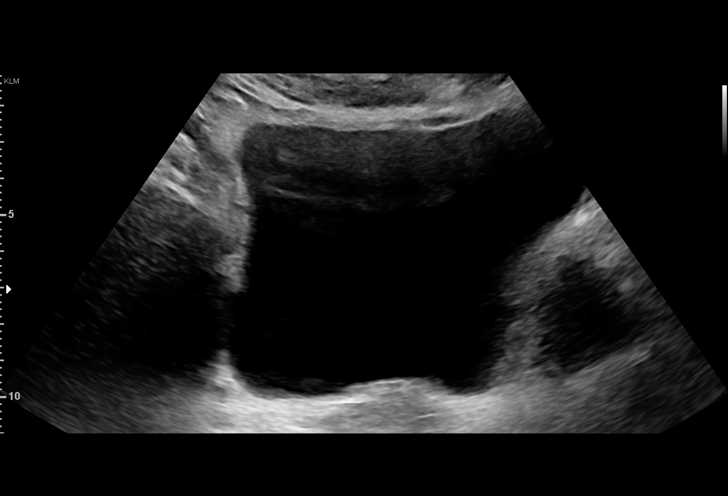
[im 9/19]
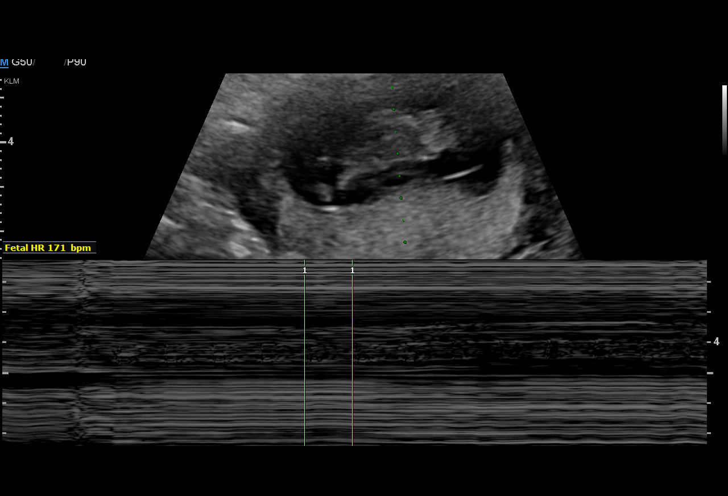
[im 10/19]
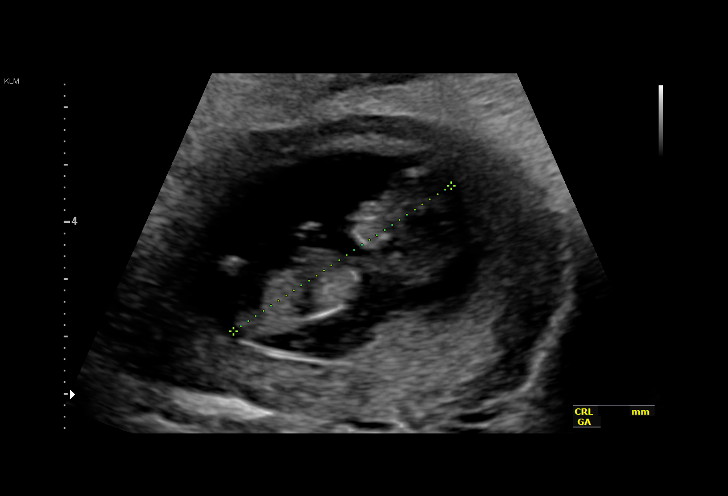
[im 11/19]
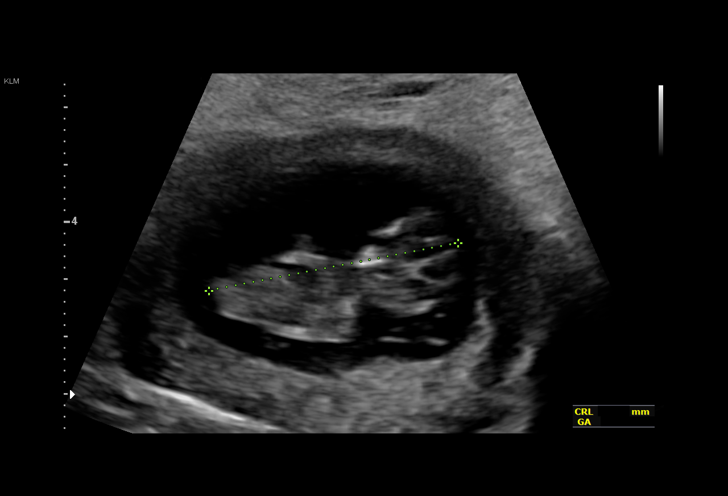
[im 13/19]
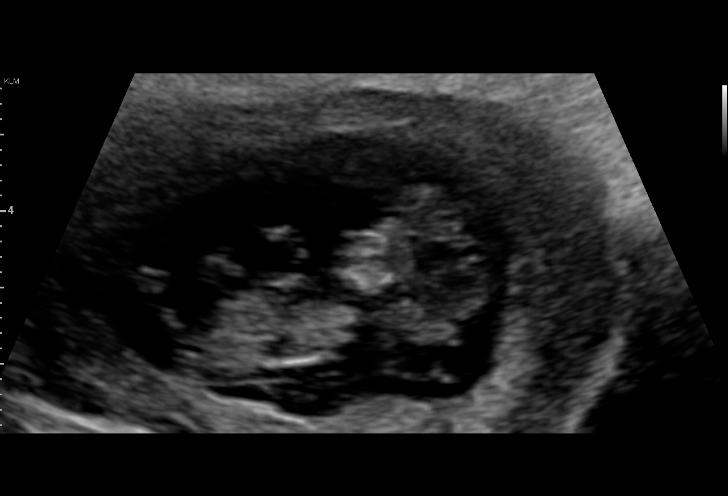
[im 14/19]
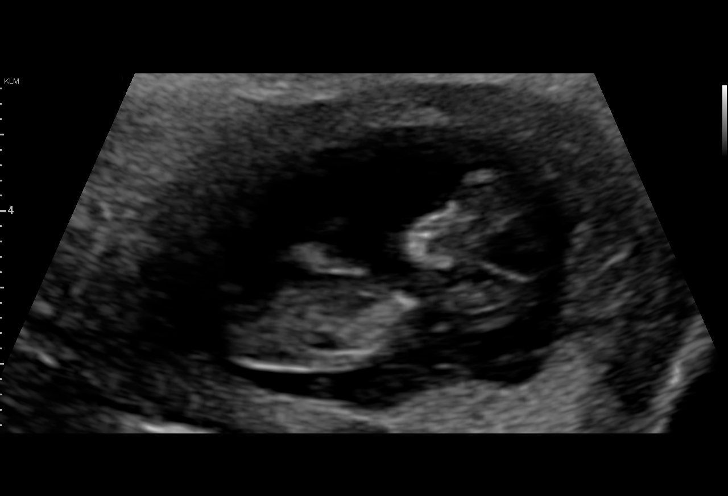
[im 15/19]
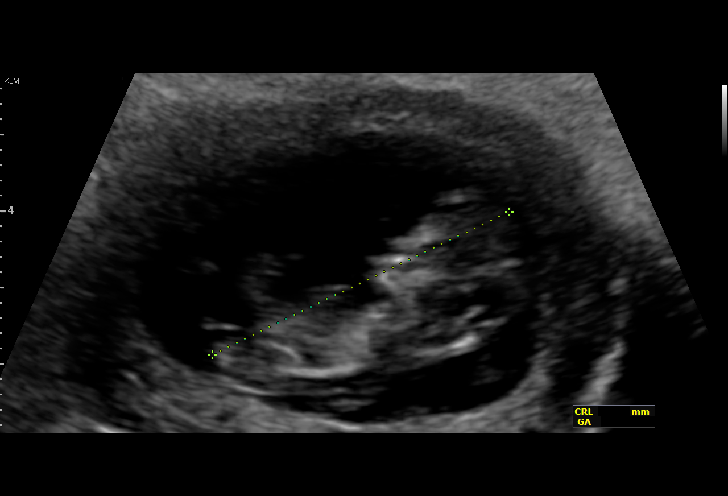
[im 16/19]
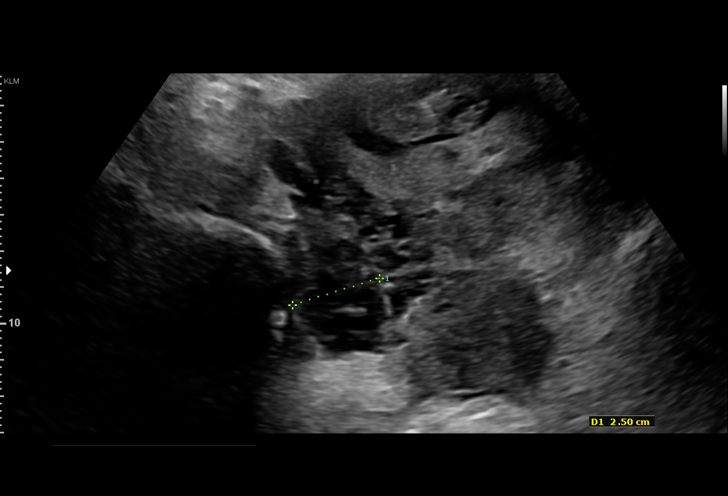
[im 18/19]
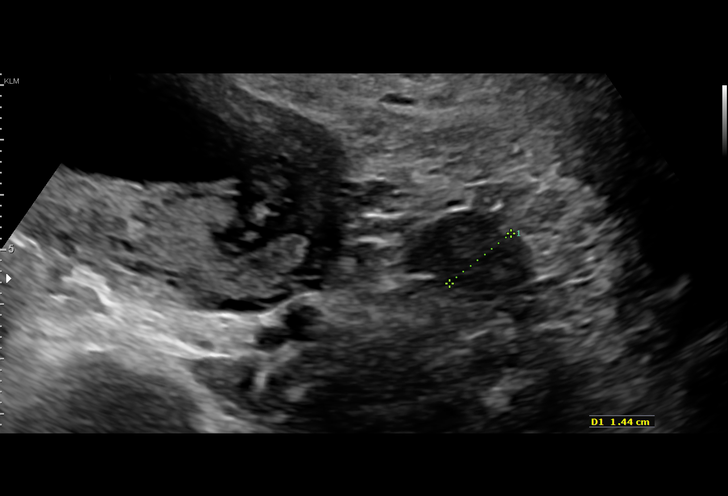
[im 19/19]
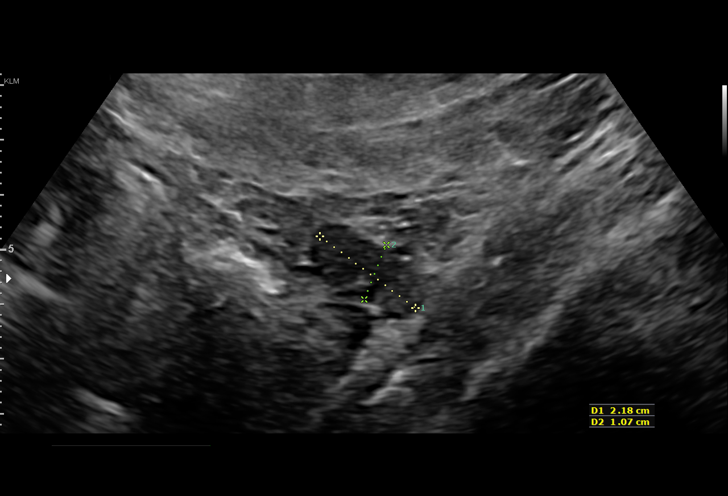

[15 of 19 positions shown; findings below may reference images not displayed]

FINDINGS: Intrauterine gestational sac: Visualized

Yolk sac:  Not visualized

Embryo:  Visualized

Cardiac Activity: Visualized

Heart Rate: 171 bpm

CRL:   43 mm   11 w 1 d                  US EDC: April 03, 2020

Subchorionic hemorrhage:  None visualized.

Maternal uterus/adnexae: Cervical os is closed. Right ovary measures
3.9 x 2.5 x 2.5 cm. Left ovary measures 2.2 x 1.1 x 1.5 cm. Beyond
small corpus luteum on the right, there is no extrauterine pelvic or
adnexal mass. No free pelvic fluid.
IMPRESSION: Single live intrauterine gestation with estimated gestational age of
approximately 11 weeks. No subchorionic hemorrhage evident. Study
otherwise unremarkable.

## 2021-01-12 ENCOUNTER — Emergency Department (HOSPITAL_COMMUNITY)
Admission: EM | Admit: 2021-01-12 | Discharge: 2021-01-13 | Disposition: A | Discharge status: Home / Self Care | Payer: Medicaid Other | Attending: Emergency Medicine | Admitting: Emergency Medicine

## 2021-01-12 ENCOUNTER — Encounter (HOSPITAL_COMMUNITY): Payer: Self-pay | Admitting: Emergency Medicine

## 2021-01-12 ENCOUNTER — Other Ambulatory Visit: Payer: Self-pay

## 2021-01-12 DIAGNOSIS — M542 Cervicalgia: Secondary | ICD-10-CM | POA: Diagnosis not present

## 2021-01-12 DIAGNOSIS — Z5321 Procedure and treatment not carried out due to patient leaving prior to being seen by health care provider: Secondary | ICD-10-CM | POA: Diagnosis not present

## 2021-01-12 DIAGNOSIS — M79602 Pain in left arm: Secondary | ICD-10-CM | POA: Insufficient documentation

## 2021-01-12 DIAGNOSIS — R6884 Jaw pain: Secondary | ICD-10-CM | POA: Diagnosis not present

## 2021-01-12 LAB — CBC
HCT: 40.1 % (ref 36.0–46.0)
Hemoglobin: 12.9 g/dL (ref 12.0–15.0)
MCH: 27.4 pg (ref 26.0–34.0)
MCHC: 32.2 g/dL (ref 30.0–36.0)
MCV: 85.3 fL (ref 80.0–100.0)
Platelets: 299 10*3/uL (ref 150–400)
RBC: 4.7 MIL/uL (ref 3.87–5.11)
RDW: 13.1 % (ref 11.5–15.5)
WBC: 8.2 10*3/uL (ref 4.0–10.5)
nRBC: 0 % (ref 0.0–0.2)

## 2021-01-12 LAB — BASIC METABOLIC PANEL
Anion gap: 11 (ref 5–15)
BUN: 7 mg/dL (ref 6–20)
CO2: 22 mmol/L (ref 22–32)
Calcium: 9.1 mg/dL (ref 8.9–10.3)
Chloride: 108 mmol/L (ref 98–111)
Creatinine, Ser: 0.71 mg/dL (ref 0.44–1.00)
GFR, Estimated: >60 mL/min (ref >60)
Glucose, Bld: 85 mg/dL (ref 70–99)
Potassium: 3.7 mmol/L (ref 3.5–5.1)
Sodium: 141 mmol/L (ref 135–145)

## 2021-01-12 LAB — TROPONIN I (HIGH SENSITIVITY)
Troponin I (High Sensitivity): 5 ng/L (ref <18)
Troponin I (High Sensitivity): 5 ng/L (ref <18)

## 2021-01-12 NOTE — ED Triage Notes (Signed)
Patient woke this morning at 1100 with intense jaw pain that radiates into left side of neck and left arm.

## 2021-01-13 NOTE — ED Notes (Signed)
Pt left AMA, advised to return if symptoms worsen. 

## 2021-03-22 ENCOUNTER — Ambulatory Visit: Payer: Medicaid Other | Admitting: Internal Medicine

## 2021-03-22 NOTE — Progress Notes (Deleted)
Name: Michelle Osborn  MRN/ DOB: [id number], [birth year]    Age/ Sex: 35 y.o., female     PCP: Patient, No Pcp Per   Reason for Endocrinology Evaluation: Hyperthyroidism     Initial Endocrinology Clinic Visit: 09/10/2019    PATIENT IDENTIFIER: Michelle Osborn is a 35 y.o., female with a past medical history of hyperthyroidism. She has followed with [facility] since 09/10/2019 for consultative assistance with management of her Hyperthyroidism  HISTORICAL SUMMARY:   Pt was diagnosed with hyperthyroidism Secondary to graves' disease (per pt) in 2017 when she was pregnant with her first child, (was living in [place]) she was put on PTU for approximately 6 months during that pregnancy and the pt was lost to follow up after her delivery in 10/2017.  She became pregnant again in 2019 with her second child, went on PTU again for most of that pregnancy , pt again was lost to follow up after her delivery in 05/2019.  She presented to the ED 08/2019 with chest pains , during evaluation she was noted to have a suppressed  TSH <0.01 uIU/mL and elevated FT4 2.47 ng/dL that's when I saw her and was lost to follow up again until 02/2021   Mother, aunt with thyroid disease SUBJECTIVE:   Today (03/22/2021):  Michelle Osborn is here for hyperthyroidism. She has not been to our clinic in 18 months.      HISTORY:  Past Medical History:  Past Medical History:  Diagnosis Date  . Anemia   . Anginal pain (HCC)    on and off.   . Anxiety   . Asthma    "grew out' not since teenager  . Bipolar 1 disorder (HCC)   . Depression   . Diabetes mellitus    not currently on meds, normal A1c  . Discoloration of skin of foot 02/2020   pt wakes up with purple feet every am. it dissipates as day goes on.  . Hydradenitis   . Hypertension   . Hyperthyroidism   . Infection    UTI  . MI (myocardial infarction) (HCC) 2017  . Neuropathy   . Syncope 02/2020   encouraged to hydrate and have snacks nearby.  has passed out. happens several x a week.  . Tachycardia   . Thyroid disease     Past Surgical History:  Past Surgical History:  Procedure Laterality Date  . ADENOIDECTOMY    . APPENDECTOMY    . CARDIAC CATHETERIZATION  03/2016   [place], [place]   . CESAREAN SECTION    . CESAREAN SECTION N/A 05/05/2019   Procedure: REPEAT CESAREAN SECTION;  Surgeon: Linzie Collin, MD;  Location: [facility];  Service: Obstetrics;  Laterality: N/A;  . CESAREAN SECTION WITH BILATERAL TUBAL LIGATION Bilateral 03/25/2020   Procedure: CESAREAN SECTION WITH BILATERAL TUBAL LIGATION;  Surgeon: Hildred Laser, MD;  Location: [facility];  Service: Obstetrics;  Laterality: Bilateral;  Repeat  . CORONARY ANGIOPLASTY WITH STENT PLACEMENT  03/2016   2 stents  . DILATION AND CURETTAGE OF UTERUS  2014  . EXCISION OF HYDRADENITIS  2009, 2012   REMOVAL OF SWEAT GLANDS  . OTHER SURGICAL HISTORY     sweat gland excision  . TONSILLECTOMY       Social History:  reports that she has been smoking cigarettes. She has a 2.50 pack-year smoking history. She has never used smokeless tobacco. She reports previous drug use. Drug: Marijuana. She reports that she does not drink alcohol. Family History:  Family  History  Problem Relation Age of Onset  . Hypertension Mother   . Asthma Mother   . Diabetes Mother   . Hyperlipidemia Mother   . Heart disease Mother   . Heart failure Mother        Defib placement  . Hypertension Father   . Kidney disease Father   . Heart disease Father   . Cancer Maternal Aunt   . Cancer Maternal Grandmother       HOME MEDICATIONS: Allergies as of 03/22/2021      Reactions   Darvocet [propoxyphene N-acetaminophen] Anaphylaxis   Also makes her stomach hurt   Peanut-containing Drug Products Shortness Of Breath, Swelling   Penicillins Shortness Of Breath, Swelling   Did it involve swelling of the face/tongue/throat, SOB, or low BP? Yes Did it involve sudden or severe rash/hives, skin peeling,  or any reaction on the inside of your mouth or nose? No Did you need to seek medical attention at a hospital or doctor's office? Yes When did it last happen?2007 If all above answers are "NO", may proceed with cephalosporin use.   Cephalexin Hives, Itching   Toradol [ketorolac Tromethamine] Rash   Red rash with bumps   Tramadol Nausea And Vomiting      Medication List       Accurate as of March 22, 2021 12:48 PM. If you have any questions, ask your nurse or doctor.        ibuprofen 800 MG tablet Commonly known as: ADVIL Take 1 tablet (800 mg total) by mouth 3 (three) times daily.         OBJECTIVE:   PHYSICAL EXAM: VS: There were no vitals taken for this visit.   EXAM: General: Pt appears well and is in NAD  Hydration: Well-hydrated with moist mucous membranes and good skin turgor  Eyes: External eye exam normal without stare, lid lag or exophthalmos.  EOM intact.  PERRL.  Ears, Nose, Throat: Hearing: Grossly intact bilaterally Dental: Good dentition  Throat: Clear without mass, erythema or exudate  Neck: General: Supple without adenopathy. Thyroid: Thyroid size normal.  No goiter or nodules appreciated. No thyroid bruit.  Lungs: Clear with good BS bilat with no rales, rhonchi, or wheezes  Heart: Auscultation: RRR.  Abdomen: Normoactive bowel sounds, soft, nontender, without masses or organomegaly palpable  Extremities: Gait and station: Normal gait  Digits and nails: No clubbing, cyanosis, petechiae, or nodes Head and neck: Normal alignment and mobility BL UE: Normal ROM and strength. BL LE: No pretibial edema normal ROM and strength.  Skin: Hair: Texture and amount normal with gender appropriate distribution Skin Inspection: No rashes, acanthosis nigricans/skin tags. No lipohypertrophy Skin Palpation: Skin temperature, texture, and thickness normal to palpation  Neuro: Cranial nerves: II - XII grossly intact  Cerebellar: Normal coordination and movement; no  tremor Motor: Normal strength throughout DTRs: 2+ and symmetric in UE without delay in relaxation phase  Mental Status: Judgment, insight: Intact Orientation: Oriented to time, place, and person Memory: Intact for recent and remote events Mood and affect: No depression, anxiety, or agitation     DATA REVIEWED: ***    ASSESSMENT / PLAN / RECOMMENDATIONS:   1. ***  Plan:  ***    Medications   ***   Signed electronically by: Lyndle Herrlich, MD  [facility]  [facility] [address] [place] [place], [place] [address] Phone: [phone] FAX: [phone]      CC: Patient, No Pcp Per No address on  file Phone: None  Fax: None   Return to Endocrinology clinic as below: Future Appointments  Date Time Provider Department Center  03/22/2021  2:00 PM Shamleffer, Konrad Dolores, MD [id number] None

## 2021-04-05 ENCOUNTER — Ambulatory Visit: Payer: Medicaid Other | Admitting: Internal Medicine

## 2021-04-05 NOTE — Progress Notes (Deleted)
Name: Michelle Osborn  MRN/ DOB: [id number], 15-Apr-1986    Age/ Sex: 35 y.o., female     PCP: Patient, No Pcp Per (Inactive)   Reason for Endocrinology Evaluation: Hyperthyroidism     Initial Endocrinology Clinic Visit: 09/10/2019    PATIENT IDENTIFIER: Michelle Osborn is a 35 y.o., female with a past medical history of hyperthyroidism. She has followed with [facility] since 09/10/2019 for consultative assistance with management of her Hyperthyroidism  HISTORICAL SUMMARY:   Pt was diagnosed with hyperthyroidism Secondary to graves' disease (per pt) in 2017 when she was pregnant with her first child, (was living in [place]) she was put on PTU for approximately 6 months during that pregnancy and the pt was lost to follow up after her delivery in 10/2017.  She became pregnant again in 2019 with her second child, went on PTU again for most of that pregnancy , pt again was lost to follow up after her delivery in 05/2019.  She presented to the ED 08/2019 with chest pains , during evaluation she was noted to have a suppressed  TSH <0.01 uIU/mL and elevated FT4 2.47 ng/dL that's when I saw her and was lost to follow up again until 02/2021   Mother, aunt with thyroid disease SUBJECTIVE:   Today (04/05/2021):  Michelle Osborn is here for hyperthyroidism. She has not been to our clinic in 19 months.      HISTORY:  Past Medical History:  Past Medical History:  Diagnosis Date  . Anemia   . Anginal pain (HCC)    on and off.   . Anxiety   . Asthma    "grew out' not since teenager  . Bipolar 1 disorder (HCC)   . Depression   . Diabetes mellitus    not currently on meds, normal A1c  . Discoloration of skin of foot 02/2020   pt wakes up with purple feet every am. it dissipates as day goes on.  . Hydradenitis   . Hypertension   . Hyperthyroidism   . Infection    UTI  . MI (myocardial infarction) (HCC) 2017  . Neuropathy   . Syncope 02/2020   encouraged to hydrate and have  snacks nearby. has passed out. happens several x a week.  . Tachycardia   . Thyroid disease    Past Surgical History:  Past Surgical History:  Procedure Laterality Date  . ADENOIDECTOMY    . APPENDECTOMY    . CARDIAC CATHETERIZATION  03/2016   [place], [place]   . CESAREAN SECTION    . CESAREAN SECTION N/A 05/05/2019   Procedure: REPEAT CESAREAN SECTION;  Surgeon: Linzie Collin, MD;  Location: [facility] ORS;  Service: Obstetrics;  Laterality: N/A;  . CESAREAN SECTION WITH BILATERAL TUBAL LIGATION Bilateral 03/25/2020   Procedure: CESAREAN SECTION WITH BILATERAL TUBAL LIGATION;  Surgeon: Hildred Laser, MD;  Location: [facility] ORS;  Service: Obstetrics;  Laterality: Bilateral;  Repeat  . CORONARY ANGIOPLASTY WITH STENT PLACEMENT  03/2016   2 stents  . DILATION AND CURETTAGE OF UTERUS  2014  . EXCISION OF HYDRADENITIS  2009, 2012   REMOVAL OF SWEAT GLANDS  . OTHER SURGICAL HISTORY     sweat gland excision  . TONSILLECTOMY      Social History:  reports that she has been smoking cigarettes. She has a 2.50 pack-year smoking history. She has never used smokeless tobacco. She reports previous drug use. Drug: Marijuana. She reports that she does not drink alcohol. Family History:  Family History  Problem Relation Age of Onset  . Hypertension Mother   . Asthma Mother   . Diabetes Mother   . Hyperlipidemia Mother   . Heart disease Mother   . Heart failure Mother        Defib placement  . Hypertension Father   . Kidney disease Father   . Heart disease Father   . Cancer Maternal Aunt   . Cancer Maternal Grandmother      HOME MEDICATIONS: Allergies as of 04/05/2021      Reactions   Darvocet [propoxyphene N-acetaminophen] Anaphylaxis   Also makes her stomach hurt   Peanut-containing Drug Products Shortness Of Breath, Swelling   Penicillins Shortness Of Breath, Swelling   Did it involve swelling of the face/tongue/throat, SOB, or low BP? Yes Did it involve sudden or severe rash/hives, skin  peeling, or any reaction on the inside of your mouth or nose? No Did you need to seek medical attention at a hospital or doctor's office? Yes When did it last happen?2007 If all above answers are "NO", may proceed with cephalosporin use.   Cephalexin Hives, Itching   Toradol [ketorolac Tromethamine] Rash   Red rash with bumps   Tramadol Nausea And Vomiting      Medication List       Accurate as of April 05, 2021  7:33 AM. If you have any questions, ask your nurse or doctor.        ibuprofen 800 MG tablet Commonly known as: ADVIL Take 1 tablet (800 mg total) by mouth 3 (three) times daily.         OBJECTIVE:   PHYSICAL EXAM: VS: There were no vitals taken for this visit.   EXAM: General: Pt appears well and is in NAD  Hydration: Well-hydrated with moist mucous membranes and good skin turgor  Eyes: External eye exam normal without stare, lid lag or exophthalmos.  EOM intact.  PERRL.  Ears, Nose, Throat: Hearing: Grossly intact bilaterally Dental: Good dentition  Throat: Clear without mass, erythema or exudate  Neck: General: Supple without adenopathy. Thyroid: Thyroid size normal.  No goiter or nodules appreciated. No thyroid bruit.  Lungs: Clear with good BS bilat with no rales, rhonchi, or wheezes  Heart: Auscultation: RRR.  Abdomen: Normoactive bowel sounds, soft, nontender, without masses or organomegaly palpable  Extremities: Gait and station: Normal gait  Digits and nails: No clubbing, cyanosis, petechiae, or nodes Head and neck: Normal alignment and mobility BL UE: Normal ROM and strength. BL LE: No pretibial edema normal ROM and strength.  Skin: Hair: Texture and amount normal with gender appropriate distribution Skin Inspection: No rashes, acanthosis nigricans/skin tags. No lipohypertrophy Skin Palpation: Skin temperature, texture, and thickness normal to palpation  Neuro: Cranial nerves: II - XII grossly intact  Cerebellar: Normal coordination and  movement; no tremor Motor: Normal strength throughout DTRs: 2+ and symmetric in UE without delay in relaxation phase  Mental Status: Judgment, insight: Intact Orientation: Oriented to time, place, and person Memory: Intact for recent and remote events Mood and affect: No depression, anxiety, or agitation     DATA REVIEWED: ***    ASSESSMENT / PLAN / RECOMMENDATIONS:   1. ***  Plan:  ***    Medications   ***   Signed electronically by: Lyndle Herrlich, MD  [facility]  [facility] [address] [place] [place], [place] [address] Phone: [phone] FAX: [phone]      CC: Patient, No Pcp Per (Inactive) No address on file  Phone: None  Fax: None   Return to Endocrinology clinic as below: Future Appointments  Date Time Provider Department Center  04/05/2021  9:10 AM Ridhi Hoffert, Konrad Dolores, MD [id number] None

## 2021-04-23 ENCOUNTER — Emergency Department (HOSPITAL_COMMUNITY): Payer: Medicaid Other

## 2021-04-23 ENCOUNTER — Emergency Department (HOSPITAL_COMMUNITY)
Admission: EM | Admit: 2021-04-23 | Discharge: 2021-04-23 | Disposition: A | Discharge status: Home / Self Care | Payer: Medicaid Other | Attending: Emergency Medicine | Admitting: Emergency Medicine

## 2021-04-23 ENCOUNTER — Other Ambulatory Visit: Payer: Self-pay

## 2021-04-23 ENCOUNTER — Encounter (HOSPITAL_COMMUNITY): Payer: Self-pay | Admitting: Emergency Medicine

## 2021-04-23 DIAGNOSIS — M25562 Pain in left knee: Secondary | ICD-10-CM | POA: Insufficient documentation

## 2021-04-23 DIAGNOSIS — E119 Type 2 diabetes mellitus without complications: Secondary | ICD-10-CM | POA: Diagnosis not present

## 2021-04-23 DIAGNOSIS — S00212A Abrasion of left eyelid and periocular area, initial encounter: Secondary | ICD-10-CM | POA: Diagnosis not present

## 2021-04-23 DIAGNOSIS — F17210 Nicotine dependence, cigarettes, uncomplicated: Secondary | ICD-10-CM | POA: Insufficient documentation

## 2021-04-23 DIAGNOSIS — S0091XA Abrasion of unspecified part of head, initial encounter: Secondary | ICD-10-CM | POA: Diagnosis not present

## 2021-04-23 DIAGNOSIS — M542 Cervicalgia: Secondary | ICD-10-CM | POA: Insufficient documentation

## 2021-04-23 DIAGNOSIS — I10 Essential (primary) hypertension: Secondary | ICD-10-CM | POA: Diagnosis not present

## 2021-04-23 DIAGNOSIS — M545 Low back pain, unspecified: Secondary | ICD-10-CM | POA: Diagnosis not present

## 2021-04-23 DIAGNOSIS — J4520 Mild intermittent asthma, uncomplicated: Secondary | ICD-10-CM | POA: Diagnosis not present

## 2021-04-23 DIAGNOSIS — R0789 Other chest pain: Secondary | ICD-10-CM | POA: Diagnosis not present

## 2021-04-23 DIAGNOSIS — Z91010 Allergy to peanuts: Secondary | ICD-10-CM | POA: Insufficient documentation

## 2021-04-23 DIAGNOSIS — S0990XA Unspecified injury of head, initial encounter: Secondary | ICD-10-CM | POA: Diagnosis present

## 2021-04-23 DIAGNOSIS — L539 Erythematous condition, unspecified: Secondary | ICD-10-CM | POA: Insufficient documentation

## 2021-04-23 MED ORDER — METHOCARBAMOL 500 MG PO TABS: 500.0000 mg | ORAL_TABLET | Freq: Two times a day (BID) | ORAL | 0 refills | Status: AC

## 2021-04-23 MED ORDER — METHOCARBAMOL 500 MG PO TABS
750.0000 mg | ORAL_TABLET | Freq: Once | ORAL | Status: AC
  Administered 2021-04-23: 750 mg via ORAL
  Filled 2021-04-23: qty 2

## 2021-04-23 MED ORDER — OXYCODONE-ACETAMINOPHEN 5-325 MG PO TABS
1.0000 | ORAL_TABLET | Freq: Once | ORAL | Status: AC
  Administered 2021-04-23: 1 via ORAL
  Filled 2021-04-23: qty 1

## 2021-04-23 NOTE — Discharge Instructions (Addendum)
We discussed results of your x-ray and CT on today's visit.  I have prescribed a short course of muscle relaxers to help with your symptoms, please take this medication as prescribed.
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
## 2021-05-21 ENCOUNTER — Emergency Department (HOSPITAL_COMMUNITY)
Admission: EM | Admit: 2021-05-21 | Discharge: 2021-05-21 | Disposition: A | Discharge status: Home / Self Care | Payer: Medicaid Other | Attending: Emergency Medicine | Admitting: Emergency Medicine

## 2021-05-21 ENCOUNTER — Other Ambulatory Visit: Payer: Self-pay

## 2021-05-21 ENCOUNTER — Encounter (HOSPITAL_COMMUNITY): Payer: Self-pay | Admitting: Emergency Medicine

## 2021-05-21 ENCOUNTER — Emergency Department (HOSPITAL_COMMUNITY): Payer: Medicaid Other

## 2021-05-21 DIAGNOSIS — S8992XA Unspecified injury of left lower leg, initial encounter: Secondary | ICD-10-CM | POA: Diagnosis present

## 2021-05-21 DIAGNOSIS — W19XXXA Unspecified fall, initial encounter: Secondary | ICD-10-CM | POA: Diagnosis not present

## 2021-05-21 DIAGNOSIS — Y92009 Unspecified place in unspecified non-institutional (private) residence as the place of occurrence of the external cause: Secondary | ICD-10-CM | POA: Diagnosis not present

## 2021-05-21 DIAGNOSIS — Z91010 Allergy to peanuts: Secondary | ICD-10-CM | POA: Diagnosis not present

## 2021-05-21 DIAGNOSIS — Z955 Presence of coronary angioplasty implant and graft: Secondary | ICD-10-CM | POA: Insufficient documentation

## 2021-05-21 DIAGNOSIS — S93402A Sprain of unspecified ligament of left ankle, initial encounter: Secondary | ICD-10-CM | POA: Diagnosis not present

## 2021-05-21 DIAGNOSIS — E119 Type 2 diabetes mellitus without complications: Secondary | ICD-10-CM | POA: Diagnosis not present

## 2021-05-21 DIAGNOSIS — S8392XA Sprain of unspecified site of left knee, initial encounter: Secondary | ICD-10-CM | POA: Diagnosis not present

## 2021-05-21 DIAGNOSIS — I10 Essential (primary) hypertension: Secondary | ICD-10-CM | POA: Insufficient documentation

## 2021-05-21 DIAGNOSIS — F17210 Nicotine dependence, cigarettes, uncomplicated: Secondary | ICD-10-CM | POA: Insufficient documentation

## 2021-05-21 DIAGNOSIS — J45909 Unspecified asthma, uncomplicated: Secondary | ICD-10-CM | POA: Diagnosis not present

## 2021-05-21 MED ORDER — MELOXICAM 7.5 MG PO TABS: 7.5000 mg | ORAL_TABLET | Freq: Every day | ORAL | 0 refills | Status: AC

## 2021-05-21 NOTE — ED Provider Notes (Signed)
[facility]-EMERGENCY DEPT Provider Note   CSN: [id number] Arrival date & time: 05/21/21  1419     History Chief Complaint  Patient presents with  . Knee Pain  . Fall    Michelle Osborn is a 35 y.o. female.  35 year old female brought in by EMS for right knee and ankle pain after a fall yesterday. Patient states she was carrying an arm full of groceries up the 6 steps to her house yesterday when she fell, landing on the ground and injuring her left knee and left ankle.  Patient states that has been unable to bear weight on this leg since the injury.  Also reports injuring her nose.  Denies loss of consciousness, states everything happened so fast.  Not anticoagulated, no other injuries, complaints, concerns.        Past Medical History:  Diagnosis Date  . Anemia   . Anginal pain (HCC)    on and off.   . Anxiety   . Asthma    "grew out' not since teenager  . Bipolar 1 disorder (HCC)   . Depression   . Diabetes mellitus    not currently on meds, normal A1c  . Discoloration of skin of foot 02/2020   pt wakes up with purple feet every am. it dissipates as day goes on.  . Hydradenitis   . Hypertension   . Hyperthyroidism   . Infection    UTI  . MI (myocardial infarction) (HCC) 2017  . Neuropathy   . Syncope 02/2020   encouraged to hydrate and have snacks nearby. has passed out. happens several x a week.  . Tachycardia   . Thyroid disease     Patient Active Problem List   Diagnosis Date Noted  . Postpartum anemia 03/27/2020  . Keloid scar of skin 03/25/2020  . S/P cesarean section 03/25/2020  . Chronic hypertension in pregnancy 12/17/2019  . UTI (urinary tract infection) during pregnancy 09/14/2019  . Back pain affecting pregnancy in third trimester 09/14/2019  . Low TSH level 09/09/2019  . Chest pain 08/30/2019  . History of pre-eclampsia 05/01/2019  . Supervision of high risk pregnancy, antepartum 01/16/2019  . Diabetes mellitus  complicating pregnancy 01/16/2019  . 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 Tachycardia 01/09/2019  . Mixed hyperlipidemia 01/09/2019  . Hyperthyroidism affecting pregnancy in second trimester 01/07/2019  . Lead exposure 12/06/2018  . Smoker 12/06/2018  . Bipolar 2 disorder (HCC) 12/06/2018  . History of anxiety 12/06/2018  . Mild intermittent asthma without complication 12/06/2018  . Type 2 diabetes mellitus without complication, without long-term current use of insulin (HCC) 12/06/2018  . High-risk pregnancy in second trimester 12/06/2018  . History of heart attack 12/06/2018  . Graves disease 10/30/2018  . History of cesarean section 10/30/2018  . Nausea/vomiting in pregnancy 10/30/2018  . History of marijuana use 10/30/2018  . HYPERCHOLESTEROLEMIA 12/08/2009  . HIDRADENITIS SUPPURATIVA 12/08/2009  . VITAMIN D DEFICIENCY 11/18/2009  . GERD 11/18/2009  . MENORRHAGIA 11/18/2009  . Diabetes mellitus type 2 with complications (HCC) 11/17/2009  . Essential hypertension 11/17/2009  . ASTHMA 11/17/2009    Past Surgical History:  Procedure Laterality Date  . ADENOIDECTOMY    . APPENDECTOMY    . CARDIAC CATHETERIZATION  03/2016   [place], [place]   . CESAREAN SECTION    . CESAREAN SECTION N/A 05/05/2019   Procedure: REPEAT CESAREAN SECTION;  Surgeon: Linzie Collin, MD;  Location: [facility] ORS;  Service:  Obstetrics;  Laterality: N/A;  . CESAREAN SECTION WITH BILATERAL TUBAL LIGATION Bilateral 03/25/2020   Procedure: CESAREAN SECTION WITH BILATERAL TUBAL LIGATION;  Surgeon: Hildred Laser, MD;  Location: [facility] ORS;  Service: Obstetrics;  Laterality: Bilateral;  Repeat  . CORONARY ANGIOPLASTY WITH STENT PLACEMENT  03/2016   2 stents  . DILATION AND CURETTAGE OF UTERUS  2014  . EXCISION OF HYDRADENITIS  2009, 2012   REMOVAL OF SWEAT GLANDS  . OTHER SURGICAL HISTORY     sweat gland excision  . TONSILLECTOMY       OB History    Gravida   3   Para  3   Term  3   Preterm      AB      Living  3     SAB      IAB      Ectopic      Multiple  0   Live Births  3           Family History  Problem Relation Age of Onset  . Hypertension Mother   . Asthma Mother   . Diabetes Mother   . Hyperlipidemia Mother   . Heart disease Mother   . Heart failure Mother        Defib placement  . Hypertension Father   . Kidney disease Father   . Heart disease Father   . Cancer Maternal Aunt   . Cancer Maternal Grandmother     Social History   Tobacco Use  . Smoking status: Current Every Day Smoker    Packs/day: 0.25    Years: 10.00    Pack years: 2.50    Types: Cigarettes  . Smokeless tobacco: Never Used  . Tobacco comment: maybe 3 a day  Vaping Use  . Vaping Use: Never used  Substance Use Topics  . Alcohol use: No  . Drug use: Not Currently    Types: Marijuana    Comment: uses to enhance appetite & to handle nausea    Home Medications Prior to Admission medications   Medication Sig Start Date End Date Taking? Authorizing Provider  meloxicam (MOBIC) 7.5 MG tablet Take 1 tablet (7.5 mg total) by mouth daily for 10 days. 05/21/21 05/31/21 Yes Jeannie Fend, PA-C  ibuprofen (ADVIL) 800 MG tablet Take 1 tablet (800 mg total) by mouth 3 (three) times daily. 08/29/20   Wieters, Hallie C, PA-C    Allergies    Darvocet [propoxyphene n-acetaminophen], Peanut-containing drug products, Penicillins, Cephalexin, Toradol [ketorolac tromethamine], and Tramadol  Review of Systems   Review of Systems  Constitutional: Negative for fever.  HENT: Positive for facial swelling. Negative for nosebleeds.   Musculoskeletal: Positive for arthralgias, gait problem, joint swelling and myalgias. Negative for back pain, neck pain and neck stiffness.  Skin: Negative for color change, rash and wound.  Neurological: Negative for weakness and numbness.  Hematological: Does not bruise/bleed easily.    Physical Exam Updated Vital  Signs BP (!) 154/101 (BP Location: Right Arm)   Pulse 90   Temp 98.3 F (36.8 C) (Oral)   Resp 19   Ht 5\' 7"  (1.702 m)   Wt 81 kg   LMP 04/23/2021   SpO2 99%   BMI 27.97 kg/m   Physical Exam Vitals and nursing note reviewed.  Constitutional:      General: She is not in acute distress.    Appearance: She is well-developed. She is not diaphoretic.  HENT:     Head: Normocephalic.  Cardiovascular:     Pulses: Normal pulses.  Pulmonary:     Effort: Pulmonary effort is normal.  Musculoskeletal:        General: Swelling and tenderness present. No deformity.     Comments: Swelling to left knee and ankle, no bruising or open wounds.  Skin:    General: Skin is warm and dry.     Findings: No erythema or rash.  Neurological:     Mental Status: She is alert and oriented to person, place, and time.     Sensory: No sensory deficit.  Psychiatric:        Behavior: Behavior normal.     ED Results / Procedures / Treatments   Labs (all labs ordered are listed, but only abnormal results are displayed) Labs Reviewed - No data to display  EKG None  Radiology DG Ankle Complete Left  Result Date: 05/21/2021 CLINICAL DATA:  Fall, pain EXAM: LEFT ANKLE COMPLETE - 3+ VIEW COMPARISON:  None. FINDINGS: No acute bony abnormality. Specifically, no fracture, subluxation, or dislocation. Joint spaces maintained. Soft tissues are intact. IMPRESSION: Negative. Electronically Signed   By: Charlett Nose M.D.   On: 05/21/2021 14:59   DG Knee Complete 4 Views Left  Result Date: 05/21/2021 CLINICAL DATA:  Fall, pain EXAM: LEFT KNEE - COMPLETE 4+ VIEW COMPARISON:  None. FINDINGS: No acute bony abnormality. Specifically, no fracture, subluxation, or dislocation. Joint spaces maintained. Early spurring in the patellofemoral compartment. No joint effusion IMPRESSION: No acute bony abnormality. Electronically Signed   By: Charlett Nose M.D.   On: 05/21/2021 14:59    Procedures Procedures   Medications  Ordered in ED Medications - No data to display  ED Course  I have reviewed the triage vital signs and the nursing notes.  Pertinent labs & imaging results that were available during my care of the patient were reviewed by me and considered in my medical decision making (see chart for details).  Clinical Course as of 05/21/21 1543  Sun May 21, 2021  1541 35 yo female with left ankle and knee pain after a fall yesterday. Xrs unremarkable. Given crutches to weight bear as tolerated, meloxicam for pain, referral to ortho for follow up if not improving. Regarding her nose injury, can recheck with PCP if pain persists, discussed possible nasal bone injury and if pain continues can follow up with ENT.  [LM]    Clinical Course User Index [LM] Alden Hipp   MDM Rules/Calculators/A&P                           Final Clinical Impression(s) / ED Diagnoses Final diagnoses:  Fall, initial encounter  Sprain of left knee, unspecified ligament, initial encounter  Sprain of left ankle, unspecified ligament, initial encounter    Rx / DC Orders ED Discharge Orders         Ordered    meloxicam (MOBIC) 7.5 MG tablet  Daily        05/21/21 1511           Alden Hipp 05/21/21 1543    Arby Barrette, MD 06/06/21 [phone]

## 2021-05-21 NOTE — Discharge Instructions (Addendum)
Apply ice for 20 minutes at at time and elevate knee/ankle. Take Meloxicam as needed as prescribed for pain (do not take other NSAIDs while taking Meloxicam). Recheck with orthopedics, call to schedule an appointment.  Use crutches, weight bear as tolerated.

## 2021-05-21 NOTE — ED Notes (Signed)
Patient refused crutches.

## 2021-05-21 NOTE — ED Triage Notes (Signed)
Arrives via EMS from home, C/C fall yesterday, now has L knee, L foot pain bruising and swelling, also hit her nose when she fell, reports swelling. She tripped while carrying groceries.   CBG 157 BP 160/palp

## 2022-04-11 ENCOUNTER — Emergency Department (HOSPITAL_COMMUNITY): Payer: Medicaid Other

## 2022-04-11 ENCOUNTER — Emergency Department (HOSPITAL_BASED_OUTPATIENT_CLINIC_OR_DEPARTMENT_OTHER): Payer: Medicaid Other

## 2022-04-11 ENCOUNTER — Encounter (HOSPITAL_COMMUNITY): Payer: Self-pay

## 2022-04-11 ENCOUNTER — Emergency Department (HOSPITAL_COMMUNITY)
Admission: EM | Admit: 2022-04-11 | Discharge: 2022-04-12 | Disposition: A | Discharge status: Home / Self Care | Payer: Medicaid Other | Attending: Emergency Medicine | Admitting: Emergency Medicine

## 2022-04-11 ENCOUNTER — Other Ambulatory Visit: Payer: Self-pay

## 2022-04-11 DIAGNOSIS — I2510 Atherosclerotic heart disease of native coronary artery without angina pectoris: Secondary | ICD-10-CM | POA: Diagnosis not present

## 2022-04-11 DIAGNOSIS — R609 Edema, unspecified: Secondary | ICD-10-CM

## 2022-04-11 DIAGNOSIS — R072 Precordial pain: Secondary | ICD-10-CM | POA: Diagnosis present

## 2022-04-11 DIAGNOSIS — I10 Essential (primary) hypertension: Secondary | ICD-10-CM | POA: Insufficient documentation

## 2022-04-11 DIAGNOSIS — R079 Chest pain, unspecified: Secondary | ICD-10-CM

## 2022-04-11 DIAGNOSIS — Z91010 Allergy to peanuts: Secondary | ICD-10-CM | POA: Insufficient documentation

## 2022-04-11 LAB — D-DIMER, QUANTITATIVE: D-Dimer, Quant: 0.34 ug/mL-FEU (ref 0.00–0.50)

## 2022-04-11 LAB — BASIC METABOLIC PANEL
Anion gap: 5 (ref 5–15)
BUN: 6 mg/dL (ref 6–20)
CO2: 23 mmol/L (ref 22–32)
Calcium: 9.2 mg/dL (ref 8.9–10.3)
Chloride: 108 mmol/L (ref 98–111)
Creatinine, Ser: 0.67 mg/dL (ref 0.44–1.00)
GFR, Estimated: >60 mL/min (ref >60)
Glucose, Bld: 102 mg/dL — ABNORMAL HIGH (ref 70–99)
Potassium: 3.7 mmol/L (ref 3.5–5.1)
Sodium: 136 mmol/L (ref 135–145)

## 2022-04-11 LAB — CBC
HCT: 39 % (ref 36.0–46.0)
Hemoglobin: 12.8 g/dL (ref 12.0–15.0)
MCH: 27.5 pg (ref 26.0–34.0)
MCHC: 32.8 g/dL (ref 30.0–36.0)
MCV: 83.7 fL (ref 80.0–100.0)
Platelets: 310 10*3/uL (ref 150–400)
RBC: 4.66 MIL/uL (ref 3.87–5.11)
RDW: 13.9 % (ref 11.5–15.5)
WBC: 8 10*3/uL (ref 4.0–10.5)
nRBC: 0 % (ref 0.0–0.2)

## 2022-04-11 LAB — TROPONIN I (HIGH SENSITIVITY)
Troponin I (High Sensitivity): 4 ng/L (ref <18)
Troponin I (High Sensitivity): 4 ng/L (ref <18)

## 2022-04-11 LAB — I-STAT BETA HCG BLOOD, ED (MC, WL, AP ONLY): I-stat hCG, quantitative: <5 m[IU]/mL (ref <5)

## 2022-04-11 NOTE — Progress Notes (Signed)
Left lower extremity venous duplex completed. ?Refer to "CV Proc" under chart review to view preliminary results. ? ?04/11/2022 7:07 PM ?Eula Fried., MHA, RVT, RDCS, RDMS   ?

## 2022-04-11 NOTE — ED Provider Triage Note (Signed)
Emergency Medicine Provider Triage Evaluation Note ? ?Neldon Mc , a 36 y.o. female  was evaluated in triage.  Pt complains of leg pain. ? ?Review of Systems  ?Positive: L leg pain, L side chest pain, sob ?Negative: Fever, cough, n/v ? ?Physical Exam  ?BP (!) 132/94   Pulse 86   Temp 98.9 ?F (37.2 ?C) (Oral)   Resp (!) 24   Ht 5\' 7"  (1.702 m)   Wt 81 kg   SpO2 100%   BMI 27.97 kg/m?  ?Gen:   Awake, no distress   ?Resp:  Normal effort  ?MSK:   Moves extremities without difficulty  ?Other:   ? ?Medical Decision Making  ?Medically screening exam initiated at 6:42 PM.  Appropriate orders placed.  Shakeila Pfarr was informed that the remainder of the evaluation will be completed by another provider, this initial triage assessment does not replace that evaluation, and the importance of remaining in the ED until their evaluation is complete. ? ?Pt with hx of blood clot here with L leg pain and cramps x 5 days, now having pain to chest and sob ?  ?Neldon Mc, PA-C ?04/11/22 1846 ? ?

## 2022-04-11 NOTE — ED Triage Notes (Addendum)
Pt states pain started in left leg as a cramp five days ago then pt noticed a small bump on left knee. Pt states her left leg started swelling 3-4 days ago. Pt states now her entire left side hurts. Pt states left leg feels like a "non stop charley horse" and upper body is sharp pains. Pt has 1+ swelling of left leg and knee. Pt has 1+ left pedal pulse, cap refill less than 3 sec, pt able to walk. Pt c/o non radiating left sided chest pain. Pt c/o dyspnea w/exertion. Pt felt SOB walking from waiting room to triage room. ?

## 2022-04-12 MED ORDER — HYDROCODONE-ACETAMINOPHEN 5-325 MG PO TABS: 1.0000 | ORAL_TABLET | ORAL | 0 refills | Status: DC | PRN

## 2022-04-12 MED ORDER — HYDROCODONE-ACETAMINOPHEN 5-325 MG PO TABS
2.0000 | ORAL_TABLET | Freq: Once | ORAL | Status: AC
  Administered 2022-04-12: 2 via ORAL
  Filled 2022-04-12: qty 2

## 2022-04-12 NOTE — Discharge Instructions (Addendum)
Begin taking hydrocodone as prescribed as needed for pain. ? ?Follow-up with cardiology in the next few days.  The contact information for the [facility] health cardiology group on [facility] has been provided in this discharge summary for you to call and make these arrangements. ? ?Return to the ER in the meantime if symptoms significantly worsen or change. ?

## 2022-04-12 NOTE — ED Provider Notes (Signed)
?[facility] ?Provider Note ? ? ?CSN: [id number] ?Arrival date & time: 04/11/22  1809 ? ?  ? ?History ? ?Chief Complaint  ?Patient presents with  ? Chest Pain  ? ? ?Michelle Osborn is a 36 y.o. female. ? ?Patient is a 36 year old female with past medical history of Graves' disease, hypertension, hyperlipidemia, and reported coronary artery disease.  She tells me she had 2 stents placed 6 years ago in [place].  She presents today with 3 to 4-day history of chest pain, left leg pain, and now pain to the entire left side of her body.  She has noted intermittent swelling to her left lower leg that began in the absence of any injury or trauma.  She is feeling chest discomfort and short of breath when she exerts herself.  She denies any fevers or chills.  She tells me she has not been seen by cardiology since her stents were placed. ? ?The history is provided by the patient.  ?Chest Pain ?Pain location:  Substernal area ?Pain quality: sharp   ?Pain radiates to:  Does not radiate ?Pain severity:  Moderate ?Onset quality:  Sudden ?Duration:  4 days ?Timing:  Constant ?Progression:  Worsening ?Chronicity:  New ?Relieved by:  Nothing ?Worsened by:  Deep breathing, movement and certain positions ?Ineffective treatments:  None tried ? ?  ? ?Home Medications ?Prior to Admission medications   ?Medication Sig Start Date End Date Taking? Authorizing Provider  ?HYDROcodone-acetaminophen (NORCO) 5-325 MG tablet Take 1-2 tablets by mouth every 4 (four) hours as needed. 04/12/22  Yes Veryl Speak, MD  ?   ? ?Allergies    ?Darvocet [propoxyphene n-acetaminophen], Peanut-containing drug products, Penicillins, Cephalexin, Toradol [ketorolac tromethamine], and Tramadol   ? ?Review of Systems   ?Review of Systems  ?Cardiovascular:  Positive for chest pain.  ?All other systems reviewed and are negative. ? ?Physical Exam ?Updated Vital Signs ?BP 135/85 (BP Location: Left Arm)   Pulse 86   Temp  98.9 ?F (37.2 ?C) (Oral)   Resp 15   Ht 5\' 7"  (1.702 m)   Wt 81 kg   SpO2 100%   BMI 27.97 kg/m?  ?Physical Exam ?Vitals and nursing note reviewed.  ?Constitutional:   ?   General: She is not in acute distress. ?   Appearance: She is well-developed. She is not diaphoretic.  ?HENT:  ?   Head: Normocephalic and atraumatic.  ?Cardiovascular:  ?   Rate and Rhythm: Normal rate and regular rhythm.  ?   Heart sounds: No murmur heard. ?  No friction rub. No gallop.  ?Pulmonary:  ?   Effort: Pulmonary effort is normal. No respiratory distress.  ?   Breath sounds: Normal breath sounds. No wheezing.  ?Abdominal:  ?   General: Bowel sounds are normal. There is no distension.  ?   Palpations: Abdomen is soft.  ?   Tenderness: There is no abdominal tenderness.  ?Musculoskeletal:     ?   General: Normal range of motion.  ?   Cervical back: Normal range of motion and neck supple.  ?   Right lower leg: No tenderness. No edema.  ?   Left lower leg: No tenderness. No edema.  ?Skin: ?   General: Skin is warm and dry.  ?Neurological:  ?   General: No focal deficit present.  ?   Mental Status: She is alert and oriented to person, place, and time.  ? ? ?ED Results / Procedures / Treatments   ?  Labs ?(all labs ordered are listed, but only abnormal results are displayed) ?Labs Reviewed  ?BASIC METABOLIC PANEL - Abnormal; Notable for the following components:  ?    Result Value  ? Glucose, Bld 102 (*)   ? All other components within normal limits  ?CBC  ?D-DIMER, QUANTITATIVE  ?I-STAT BETA HCG BLOOD, ED ([facility], [facility], [facility] ONLY)  ?TROPONIN I (HIGH SENSITIVITY)  ?TROPONIN I (HIGH SENSITIVITY)  ? ? ?EKG ?EKG Interpretation ? ?Date/Time:  Wednesday April 11 2022 18:17:32 EDT ?Ventricular Rate:  84 ?PR Interval:  116 ?QRS Duration: 72 ?QT Interval:  354 ?QTC Calculation: 418 ?R Axis:   -11 ?Text Interpretation: Normal sinus rhythm Minimal voltage criteria for LVH, may be normal variant ( R in aVL ) Cannot rule out Anterior infarct , age undetermined  Abnormal ECG When compared with ECG of 12-Jan-2021 18:27, PREVIOUS ECG IS PRESENT since last tracing no significant change Confirmed by Daleen Bo [id number]) on 04/11/2022 11:14:48 PM ? ?Radiology ?DG Chest 2 View ? ?Result Date: 04/11/2022 ?CLINICAL DATA:  Chest pain. EXAM: CHEST - 2 VIEW COMPARISON:  Chest and rib series is well as apices from cervical spine 04/23/2021 FINDINGS: The cardiomediastinal contours are normal. The lungs are clear. Pulmonary vasculature is normal. No consolidation, pleural effusion, or pneumothorax. Rightward tracheal deviation is T2 enlarged left lobe of the thyroid gland when compared with prior chest CT. This is chronic and unchanged. No acute osseous abnormalities are seen. IMPRESSION: 1. No acute chest findings. 2. 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 Electronically Signed   By: Keith Rake M.D.   On: 04/11/2022 19:30  ? ?VAS [place] LOWER EXTREMITY VENOUS (DVT) (7a-7p) ? ?Result Date: 04/11/2022 ? Lower Venous DVT Study Patient Name:  Michelle Osborn  Date of Exam:   04/11/2022 Medical Rec #: [id number]      Accession #:    [id number] Date of Birth: 08/26/1986      Patient Gender: F Patient Age:   36 years Exam Location:  [facility] Procedure:      VAS [place] LOWER EXTREMITY VENOUS (DVT) Referring Phys: Domenic Moras --------------------------------------------------------------------------------  Indications: Edema.  Comparison Study: No prior study Performing Technologist: Maudry Mayhew MHA, RDMS, RVT, RDCS  Examination Guidelines: A complete evaluation includes B-mode imaging, spectral Doppler, color Doppler, and power Doppler as needed of all accessible portions of each vessel. Bilateral testing is considered an integral part of a complete examination. Limited examinations for reoccurring indications may be performed as noted. The reflux portion of the exam is performed with the patient in reverse Trendelenburg.   +-----+---------------+---------+-----------+----------+--------------+ RIGHTCompressibilityPhasicitySpontaneityPropertiesThrombus Aging +-----+---------------+---------+-----------+----------+--------------+ CFV  Full           Yes      Yes                                 +-----+---------------+---------+-----------+----------+--------------+   +---------+---------------+---------+-----------+----------+--------------+ LEFT     CompressibilityPhasicitySpontaneityPropertiesThrombus Aging +---------+---------------+---------+-----------+----------+--------------+ CFV      Full           Yes      Yes                                 +---------+---------------+---------+-----------+----------+--------------+ SFJ      Full                                                        +---------+---------------+---------+-----------+----------+--------------+  FV Prox  Full                                                        +---------+---------------+---------+-----------+----------+--------------+ FV Mid   Full                                                        +---------+---------------+---------+-----------+----------+--------------+ FV DistalFull                                                        +---------+---------------+---------+-----------+----------+--------------+ PFV      Full                                                        +---------+---------------+---------+-----------+----------+--------------+ POP      Full           Yes      Yes                                 +---------+---------------+---------+-----------+----------+--------------+ PTV      Full                                                        +---------+---------------+---------+-----------+----------+--------------+ PERO     Full                                                         +---------+---------------+---------+-----------+----------+--------------+     Summary: RIGHT: - No evidence of common femoral vein obstruction.  LEFT: - There is no evidence of deep vein thrombosis in the lower extremity.  - No cystic structure found in the popliteal fossa.  *See table(s) above for measurements and observations.    Preliminary    ? ?Procedures ?Procedures

## 2022-04-19 NOTE — Progress Notes (Incomplete)
CARDIOLOGY CONSULT NOTE  ? ? ? ? ? ?Patient ID: ?Michelle Osborn ?MRN: [id number] ?DOB/AGE: [birth year] 36 y.o. ? ?Admit date: (Not on file) ?Referring Physician: Delo ?Primary Physician: Patient, No Pcp Per (Inactive) ?Primary Cardiologist: New ?Reason for Consultation: Chest Pain ? ?Active Problems: ?  * No active hospital problems. * ? ? ?HPI:  36 y.o. referred by ED Dr Stark Jock for chest pain. Seen in ED 04/11/22 History of Graves, HTN, HLD. She reports history of CAD with stents placed in [place] 6 years ago.  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 ?Dr Rockey Situ noted stent to LAD and D1 in April 2017  ? ?*** ? ?ROS ?All other systems reviewed and negative except as noted above ? ?Past Medical History:  ?Diagnosis Date  ?? Anemia   ?? Anginal pain ([place])   ? on and off.   ?? Anxiety   ?? Asthma   ? "grew out' not since teenager  ?? Bipolar 1 disorder ([place])   ?? Depression   ?? Diabetes mellitus   ? not currently on meds, normal A1c  ?? Discoloration of skin of foot 02/2020  ? pt wakes up with purple feet every am. it dissipates as day goes on.  ?? Hydradenitis   ?? Hypertension   ?? Hyperthyroidism   ?? Infection   ? UTI  ?? MI (myocardial infarction) ([place]) 2017  ?? Neuropathy   ?? Syncope 02/2020  ? encouraged to hydrate and have snacks nearby. has passed out. happens several x a week.  ?? Tachycardia   ?? Thyroid disease   ?  ?Family History  ?Problem Relation Age of Onset  ?? Hypertension Mother   ?? Asthma Mother   ?? Diabetes Mother   ?? Hyperlipidemia Mother   ?? Heart disease Mother   ?? Heart failure Mother   ?     Defib placement  ?? Hypertension Father   ?? Kidney disease Father   ?? Heart disease Father   ?? Cancer Maternal Aunt   ?? Cancer Maternal Grandmother   ?   ?Social History  ? ?Socioeconomic History  ?? Marital status: Significant Other  ?  Spouse name: Chrissie Noa  ?? Number of children: 3  ?? Years of education: Not on file  ?? Highest education level: Not on file  ?Occupational History  ?? Occupation: homemaker  ?Tobacco Use  ?? Smoking status: Every Day  ?  Packs/day: 0.25  ?  Years: 10.00  ?  Pack years: 2.50  ?  Types: Cigarettes  ?? Smokeless tobacco: Never  ?? Tobacco comments:  ?  maybe 3 a day  ?Vaping Use  ?? Vaping Use: Never used  ?Substance and Sexual Activity  ?? Alcohol use: No  ?? Drug use: Not Currently  ?  Types: Marijuana  ?  Comment: uses to enhance appetite & to handle nausea  ?? Sexual activity: Not Currently  ?  Birth control/protection: None  ?Other Topics Concern  ?? Not on file  ?Social History Narrative  ? Patient lives with significant other, his 36 year old and 2 toddlers  ? ?Social Determinants of Health  ? ?Financial Resource Strain: Not on file  ?Food Insecurity: Not on file  ?Transportation Needs: Not on file  ?  Physical Activity: Not on file  ?Stress: Not on file  ?Social Connections: Not on file  ?Intimate Partner Violence: Not on file  ?  ?Past Surgical History:  ?Procedure Laterality Date  ?? ADENOIDECTOMY    ?? APPENDECTOMY    ?? CARDIAC CATHETERIZATION  03/2016  ? [place], [place]   ?? CESAREAN SECTION    ?? CESAREAN SECTION N/A 05/05/2019  ? Procedure: REPEAT CESAREAN SECTION;  Surgeon: Harlin Heys, MD;  Location: [facility] ORS;  Service: Obstetrics;  Laterality: N/A;  ?? CESAREAN SECTION WITH BILATERAL TUBAL LIGATION Bilateral 03/25/2020  ? Procedure: CESAREAN SECTION WITH BILATERAL TUBAL LIGATION;  Surgeon: Rubie Maid, MD;  Location: [facility] ORS;  Service: Obstetrics;  Laterality: Bilateral;  Repeat  ?? CORONARY ANGIOPLASTY WITH STENT PLACEMENT  03/2016  ? 2 stents  ?? [place] OF UTERUS  2014  ?? EXCISION OF HYDRADENITIS  2009, 2012  ? REMOVAL OF SWEAT GLANDS  ?? OTHER SURGICAL HISTORY    ? sweat gland excision  ??  TONSILLECTOMY    ?  ? ? ?Current Outpatient Medications:  ??  HYDROcodone-acetaminophen (NORCO) 5-325 MG tablet, Take 1-2 tablets by mouth every 4 (four) hours as needed., Disp: 12 tablet, Rfl: 0 ? ? ? ?Physical Exam: ?There were no vitals taken for this visit.  ? ?Affect appropriate ?Healthy:  appears stated age ?HEENT: normal ?Neck supple with no adenopathy ?JVP normal no bruits no thyromegaly ?Lungs clear with no wheezing and good diaphragmatic motion ?Heart:  S1/S2 no murmur, no rub, gallop or click ?PMI normal ?Abdomen: benighn, BS positve, no tenderness, no AAA ?no bruit.  No HSM or HJR ?Distal pulses intact with no bruits ?No edema ?Neuro non-focal ?Skin warm and dry ?No muscular weakness ? ? ?Labs: ?  ?Lab Results  ?Component Value Date  ? WBC 8.0 04/11/2022  ? HGB 12.8 04/11/2022  ? HCT 39.0 04/11/2022  ? MCV 83.7 04/11/2022  ? PLT 310 04/11/2022  ? No results for input(s): NA, K, CL, CO2, BUN, CREATININE, CALCIUM, PROT, BILITOT, ALKPHOS, ALT, AST, GLUCOSE in the last 168 hours. ? ?Invalid input(s): LABALBU ?Lab Results  ?Component Value Date  ? TROPONINI <0.03 06/12/2018  ?  ?Lab Results  ?Component Value Date  ? CHOL 176 11/18/2009  ? CHOL  05/22/2009  ?  164        ?ATP III CLASSIFICATION: ? <200     mg/dL   Desirable ? 200-239  mg/dL   Borderline High ? >=240    mg/dL   High ?        ? ?Lab Results  ?Component Value Date  ? HDL 37 (L) 11/18/2009  ? HDL 21 (L) 05/22/2009  ? ?Lab Results  ?Component Value Date  ? LDLCALC 127 (H) 11/18/2009  ? [place] (H) 05/22/2009  ?  124        ?Total Cholesterol/HDL:CHD Risk ?Coronary Heart Disease Risk Table ?                    Men   Women ? 1/2 Average Risk   3.4   3.3 ? Average Risk       5.0   4.4 ? 2 X Average Risk   9.6   7.1 ? 3 X Average Risk  23.4   11.0 ?       ?Use the calculated Patient Ratio ?above and the CHD Risk Table ?to determine the patient's CHD Risk. ?       ?ATP III CLASSIFICATION (  LDL): ? <100     mg/dL   Optimal ? 100-129  mg/dL   Near or  Above ?                   Optimal ? 130-159  mg/dL   Borderline ? 160-189  mg/dL   High ? >190     mg/dL   Very High  ? ?Lab Results  ?Component Value Date  ? TRIG 62 11/18/2009  ? TRIG 94 05/22/2009  ? ?Lab Results  ?Component Value Date  ? CHOLHDL 4.8 Ratio 11/18/2009  ? CHOLHDL 7.8 05/22/2009  ? ?No results found for: LDLDIRECT  ?  ?Radiology: ?DG Chest 2 View ? ?Result Date: 04/11/2022 ?CLINICAL DATA:  Chest pain. EXAM: CHEST - 2 VIEW COMPARISON:  Chest and rib series is well as apices from cervical spine 04/23/2021 FINDINGS: The cardiomediastinal contours are normal. The lungs are clear. Pulmonary vasculature is normal. No consolidation, pleural effusion, or pneumothorax. Rightward tracheal deviation is T2 enlarged left lobe of the thyroid gland when compared with prior chest CT. This is chronic and unchanged. No acute osseous abnormalities are seen. IMPRESSION: 1. No acute chest findings. 2. 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 Electronically Signed   By: Keith Rake M.D.   On: 04/11/2022 19:30  ? ?VAS [place] LOWER EXTREMITY VENOUS (DVT) (7a-7p) ? ?Result Date: 04/12/2022 ? Lower Venous DVT Study Patient Name:  SHERRE WOOLCOTT  Date of Exam:   04/11/2022 Medical Rec #: [id number]      Accession #:    [id number] Date of Birth: [birth year]      Patient Gender: F Patient Age:   62 years Exam [facility] Procedure:      VAS [place] LOWER EXTREMITY VENOUS (DVT) Referring Phys: Domenic Moras --------------------------------------------------------------------------------  Indications: Edema.  Comparison Study: No prior study Performing Technologist: Maudry Mayhew MHA, RDMS, RVT, RDCS  Examination Guidelines: A complete evaluation includes B-mode imaging, spectral Doppler, color Doppler, and power Doppler as needed of all accessible portions of each vessel. Bilateral testing is considered an integral part of a complete examination. Limited examinations for  reoccurring indications may be performed as noted. The reflux portion of the exam is performed with the patient in reverse Trendelenburg.  +-----+---------------+---------+-----------+----------+--------------+ RIGHTCo

## 2022-04-24 ENCOUNTER — Ambulatory Visit: Payer: Medicaid Other | Admitting: Cardiology

## 2022-04-27 ENCOUNTER — Emergency Department (HOSPITAL_COMMUNITY)
Admission: EM | Admit: 2022-04-27 | Discharge: 2022-04-27 | Disposition: A | Discharge status: Home / Self Care | Payer: Medicaid Other | Attending: Emergency Medicine | Admitting: Emergency Medicine

## 2022-04-27 ENCOUNTER — Emergency Department (HOSPITAL_COMMUNITY): Payer: Medicaid Other

## 2022-04-27 ENCOUNTER — Other Ambulatory Visit: Payer: Self-pay

## 2022-04-27 ENCOUNTER — Encounter (HOSPITAL_COMMUNITY): Payer: Self-pay

## 2022-04-27 DIAGNOSIS — R519 Headache, unspecified: Secondary | ICD-10-CM | POA: Diagnosis not present

## 2022-04-27 DIAGNOSIS — S8992XA Unspecified injury of left lower leg, initial encounter: Secondary | ICD-10-CM | POA: Diagnosis present

## 2022-04-27 DIAGNOSIS — R0789 Other chest pain: Secondary | ICD-10-CM | POA: Diagnosis not present

## 2022-04-27 DIAGNOSIS — S00511A Abrasion of lip, initial encounter: Secondary | ICD-10-CM | POA: Diagnosis not present

## 2022-04-27 DIAGNOSIS — D72829 Elevated white blood cell count, unspecified: Secondary | ICD-10-CM | POA: Diagnosis not present

## 2022-04-27 DIAGNOSIS — M25561 Pain in right knee: Secondary | ICD-10-CM | POA: Insufficient documentation

## 2022-04-27 DIAGNOSIS — Z91010 Allergy to peanuts: Secondary | ICD-10-CM | POA: Diagnosis not present

## 2022-04-27 DIAGNOSIS — I10 Essential (primary) hypertension: Secondary | ICD-10-CM | POA: Diagnosis not present

## 2022-04-27 DIAGNOSIS — S40812A Abrasion of left upper arm, initial encounter: Secondary | ICD-10-CM | POA: Insufficient documentation

## 2022-04-27 DIAGNOSIS — R109 Unspecified abdominal pain: Secondary | ICD-10-CM | POA: Insufficient documentation

## 2022-04-27 DIAGNOSIS — S72412A Displaced unspecified condyle fracture of lower end of left femur, initial encounter for closed fracture: Secondary | ICD-10-CM | POA: Insufficient documentation

## 2022-04-27 DIAGNOSIS — S72492A Other fracture of lower end of left femur, initial encounter for closed fracture: Secondary | ICD-10-CM

## 2022-04-27 LAB — I-STAT CHEM 8, ED
BUN: 11 mg/dL (ref 6–20)
Calcium, Ion: 1.19 mmol/L (ref 1.15–1.40)
Chloride: 105 mmol/L (ref 98–111)
Creatinine, Ser: 0.6 mg/dL (ref 0.44–1.00)
Glucose, Bld: 186 mg/dL — ABNORMAL HIGH (ref 70–99)
HCT: 40 % (ref 36.0–46.0)
Hemoglobin: 13.6 g/dL (ref 12.0–15.0)
Potassium: 3.8 mmol/L (ref 3.5–5.1)
Sodium: 141 mmol/L (ref 135–145)
TCO2: 23 mmol/L (ref 22–32)

## 2022-04-27 LAB — CBC WITH DIFFERENTIAL/PLATELET
Abs Immature Granulocytes: 0.08 10*3/uL — ABNORMAL HIGH (ref 0.00–0.07)
Basophils Absolute: 0 10*3/uL (ref 0.0–0.1)
Basophils Relative: 0 %
Eosinophils Absolute: 0 10*3/uL (ref 0.0–0.5)
Eosinophils Relative: 0 %
HCT: 37.3 % (ref 36.0–46.0)
Hemoglobin: 12.6 g/dL (ref 12.0–15.0)
Immature Granulocytes: 1 %
Lymphocytes Relative: 10 %
Lymphs Abs: 1.5 10*3/uL (ref 0.7–4.0)
MCH: 27.9 pg (ref 26.0–34.0)
MCHC: 33.8 g/dL (ref 30.0–36.0)
MCV: 82.7 fL (ref 80.0–100.0)
Monocytes Absolute: 0.7 10*3/uL (ref 0.1–1.0)
Monocytes Relative: 4 %
Neutro Abs: 13.6 10*3/uL — ABNORMAL HIGH (ref 1.7–7.7)
Neutrophils Relative %: 85 %
Platelets: 303 10*3/uL (ref 150–400)
RBC: 4.51 MIL/uL (ref 3.87–5.11)
RDW: 13.4 % (ref 11.5–15.5)
WBC: 15.9 10*3/uL — ABNORMAL HIGH (ref 4.0–10.5)
nRBC: 0 % (ref 0.0–0.2)

## 2022-04-27 LAB — BASIC METABOLIC PANEL
Anion gap: 8 (ref 5–15)
BUN: 9 mg/dL (ref 6–20)
CO2: 22 mmol/L (ref 22–32)
Calcium: 9.3 mg/dL (ref 8.9–10.3)
Chloride: 107 mmol/L (ref 98–111)
Creatinine, Ser: 0.72 mg/dL (ref 0.44–1.00)
GFR, Estimated: >60 mL/min (ref >60)
Glucose, Bld: 178 mg/dL — ABNORMAL HIGH (ref 70–99)
Potassium: 3.7 mmol/L (ref 3.5–5.1)
Sodium: 137 mmol/L (ref 135–145)

## 2022-04-27 LAB — I-STAT BETA HCG BLOOD, ED (MC, WL, AP ONLY): I-stat hCG, quantitative: <5 m[IU]/mL (ref <5)

## 2022-04-27 MED ORDER — HYDROMORPHONE HCL 1 MG/ML IJ SOLN
1.0000 mg | Freq: Once | INTRAMUSCULAR | Status: AC
  Administered 2022-04-27: 1 mg via INTRAVENOUS
  Filled 2022-04-27: qty 1

## 2022-04-27 MED ORDER — ONDANSETRON HCL 4 MG PO TABS: 4.0000 mg | ORAL_TABLET | Freq: Four times a day (QID) | ORAL | 0 refills | Status: DC

## 2022-04-27 MED ORDER — HYDROCODONE-ACETAMINOPHEN 5-325 MG PO TABS: 1.0000 | ORAL_TABLET | Freq: Four times a day (QID) | ORAL | 0 refills | Status: AC | PRN

## 2022-04-27 MED ORDER — ONDANSETRON HCL 4 MG/2ML IJ SOLN
4.0000 mg | Freq: Once | INTRAMUSCULAR | Status: AC
Start: 2022-04-27 — End: 2022-04-27
  Administered 2022-04-27: 4 mg via INTRAVENOUS
  Filled 2022-04-27: qty 2

## 2022-04-27 MED ORDER — ONDANSETRON HCL 4 MG/2ML IJ SOLN
4.0000 mg | Freq: Once | INTRAMUSCULAR | Status: AC
  Administered 2022-04-27: 4 mg via INTRAVENOUS
  Filled 2022-04-27: qty 2

## 2022-04-27 MED ORDER — IOHEXOL 300 MG/ML  SOLN
100.0000 mL | Freq: Once | INTRAMUSCULAR | Status: AC | PRN
  Administered 2022-04-27: 100 mL via INTRAVENOUS

## 2022-04-27 MED ORDER — SODIUM CHLORIDE 0.9 % IV BOLUS
1000.0000 mL | Freq: Once | INTRAVENOUS | Status: AC
  Administered 2022-04-27: 1000 mL via INTRAVENOUS

## 2022-04-27 MED ORDER — DOXYCYCLINE HYCLATE 100 MG PO CAPS: 100.0000 mg | ORAL_CAPSULE | Freq: Two times a day (BID) | ORAL | 0 refills | Status: AC

## 2022-04-27 NOTE — Progress Notes (Signed)
Orthopedic Tech Progress Note ?Patient Details:  ?Michelle Osborn ?[birth year] ?[id number] ? ?Ortho Devices ?Type of Ortho Device: Knee Immobilizer ?Ortho Device/Splint Location: lle ?Ortho Device/Splint Interventions: Ordered, Application, Adjustment ?  ?Post Interventions ?Patient Tolerated: Well ? ?Al Decant ?04/27/2022, 6:23 AM ? ?
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
## 2022-04-27 NOTE — ED Notes (Addendum)
Refused crutches. Pt has crutches at home. Aunt is coming to pick up pt.  ?

## 2022-04-27 NOTE — ED Notes (Signed)
Ortho at bedside and place immobilizer to patient left knee. Patient refuses crutches and reports she has some at home she will use if she feels as though she needs them ?
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
## 2022-04-30 ENCOUNTER — Ambulatory Visit: Payer: Medicaid Other | Admitting: Cardiovascular Disease

## 2022-05-16 ENCOUNTER — Emergency Department (HOSPITAL_COMMUNITY): Payer: Medicaid Other

## 2022-05-16 ENCOUNTER — Other Ambulatory Visit: Payer: Self-pay

## 2022-05-16 ENCOUNTER — Emergency Department (HOSPITAL_COMMUNITY)
Admission: EM | Admit: 2022-05-16 | Discharge: 2022-05-17 | Disposition: A | Discharge status: Home / Self Care | Payer: Medicaid Other | Attending: Emergency Medicine | Admitting: Emergency Medicine

## 2022-05-16 ENCOUNTER — Encounter (HOSPITAL_COMMUNITY): Payer: Self-pay | Admitting: Emergency Medicine

## 2022-05-16 DIAGNOSIS — I10 Essential (primary) hypertension: Secondary | ICD-10-CM | POA: Diagnosis not present

## 2022-05-16 DIAGNOSIS — R0602 Shortness of breath: Secondary | ICD-10-CM | POA: Diagnosis not present

## 2022-05-16 DIAGNOSIS — J029 Acute pharyngitis, unspecified: Secondary | ICD-10-CM | POA: Diagnosis present

## 2022-05-16 DIAGNOSIS — F17210 Nicotine dependence, cigarettes, uncomplicated: Secondary | ICD-10-CM | POA: Insufficient documentation

## 2022-05-16 DIAGNOSIS — E049 Nontoxic goiter, unspecified: Secondary | ICD-10-CM | POA: Diagnosis not present

## 2022-05-16 DIAGNOSIS — J45909 Unspecified asthma, uncomplicated: Secondary | ICD-10-CM | POA: Diagnosis not present

## 2022-05-16 DIAGNOSIS — E119 Type 2 diabetes mellitus without complications: Secondary | ICD-10-CM | POA: Diagnosis not present

## 2022-05-16 LAB — CBC
HCT: 38.7 % (ref 36.0–46.0)
Hemoglobin: 12.8 g/dL (ref 12.0–15.0)
MCH: 28 pg (ref 26.0–34.0)
MCHC: 33.1 g/dL (ref 30.0–36.0)
MCV: 84.7 fL (ref 80.0–100.0)
Platelets: 318 10*3/uL (ref 150–400)
RBC: 4.57 MIL/uL (ref 3.87–5.11)
RDW: 14 % (ref 11.5–15.5)
WBC: 7.9 10*3/uL (ref 4.0–10.5)
nRBC: 0 % (ref 0.0–0.2)

## 2022-05-16 LAB — COMPREHENSIVE METABOLIC PANEL
ALT: 15 U/L (ref 0–44)
AST: 14 U/L — ABNORMAL LOW (ref 15–41)
Albumin: 3.8 g/dL (ref 3.5–5.0)
Alkaline Phosphatase: 113 U/L (ref 38–126)
Anion gap: 3 — ABNORMAL LOW (ref 5–15)
BUN: 7 mg/dL (ref 6–20)
CO2: 24 mmol/L (ref 22–32)
Calcium: 10.1 mg/dL (ref 8.9–10.3)
Chloride: 112 mmol/L — ABNORMAL HIGH (ref 98–111)
Creatinine, Ser: 0.72 mg/dL (ref 0.44–1.00)
GFR, Estimated: >60 mL/min (ref >60)
Glucose, Bld: 114 mg/dL — ABNORMAL HIGH (ref 70–99)
Potassium: 4.1 mmol/L (ref 3.5–5.1)
Sodium: 139 mmol/L (ref 135–145)
Total Bilirubin: 0.2 mg/dL — ABNORMAL LOW (ref 0.3–1.2)
Total Protein: 7.6 g/dL (ref 6.5–8.1)

## 2022-05-16 LAB — TSH: TSH: 0.107 u[IU]/mL — ABNORMAL LOW (ref 0.350–4.500)

## 2022-05-16 LAB — T4, FREE: Free T4: 1.22 ng/dL — ABNORMAL HIGH (ref 0.61–1.12)

## 2022-05-16 MED ORDER — SODIUM CHLORIDE 0.9 % IV BOLUS
1000.0000 mL | Freq: Once | INTRAVENOUS | Status: AC
  Administered 2022-05-16: 1000 mL via INTRAVENOUS

## 2022-05-16 MED ORDER — ONDANSETRON HCL 4 MG/2ML IJ SOLN
4.0000 mg | Freq: Once | INTRAMUSCULAR | Status: AC
  Administered 2022-05-16: 4 mg via INTRAVENOUS
  Filled 2022-05-16: qty 2
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
## 2022-05-16 NOTE — ED Triage Notes (Signed)
Patient reports left sore throat with swelling onset 2 days ago , respirations unlabored/no cough , denies fever or chills .  ?

## 2022-05-17 MED ORDER — PREDNISONE 20 MG PO TABS: 40.0000 mg | ORAL_TABLET | Freq: Every day | ORAL | 0 refills | Status: AC

## 2022-05-17 MED ORDER — FENTANYL CITRATE PF 50 MCG/ML IJ SOSY
50.0000 ug | PREFILLED_SYRINGE | Freq: Once | INTRAMUSCULAR | Status: AC
  Administered 2022-05-17: 50 ug via INTRAVENOUS
  Filled 2022-05-17: qty 1

## 2022-05-17 MED ORDER — IOHEXOL 300 MG/ML  SOLN
75.0000 mL | Freq: Once | INTRAMUSCULAR | Status: AC | PRN
  Administered 2022-05-17: 75 mL via INTRAVENOUS

## 2022-05-17 MED ORDER — CLINDAMYCIN HCL 150 MG PO CAPS: 150.0000 mg | ORAL_CAPSULE | Freq: Four times a day (QID) | ORAL | 0 refills | Status: DC

## 2022-05-17 MED ORDER — METHYLPREDNISOLONE SODIUM SUCC 125 MG IJ SOLR
125.0000 mg | Freq: Once | INTRAMUSCULAR | Status: AC
  Administered 2022-05-17: 125 mg via INTRAVENOUS
  Filled 2022-05-17: qty 2

## 2022-05-17 NOTE — ED Provider Notes (Signed)
[facility]-EMERGENCY DEPT [facility] Emergency Department Provider Note MRN:  [id number]  Arrival date & time: 05/17/22     Chief Complaint   Sore Throat   History of Present Illness   Michelle Osborn is a 36 y.o. year-old female with a history of diabetes, thyroid disease presenting to the ED with chief complaint of sore throat.  Severe pain with swallowing as well as neck swelling for the past several days.  Explains that when she swallows there is a mass that protrudes out of the left side of her neck.  Feels short of breath when she lays flat.  Denies fever.  Review of Systems  A thorough review of systems was obtained and all systems are negative except as noted in the HPI and PMH.   Patient's Health History    Past Medical History:  Diagnosis Date   Anemia    Anginal pain (HCC)    on and off.    Anxiety    Asthma    "grew out' not since teenager   Bipolar 1 disorder (HCC)    Depression    Diabetes mellitus    not currently on meds, normal A1c   Discoloration of skin of foot 02/2020   pt wakes up with purple feet every am. it dissipates as day goes on.   Hydradenitis    Hypertension    Hyperthyroidism    Infection    UTI   MI (myocardial infarction) (HCC) 2017   Neuropathy    Syncope 02/2020   encouraged to hydrate and have snacks nearby. has passed out. happens several x a week.   Tachycardia    Thyroid disease     Past Surgical History:  Procedure Laterality Date   ADENOIDECTOMY     APPENDECTOMY     CARDIAC CATHETERIZATION  03/2016   [place], [place]    CESAREAN SECTION     CESAREAN SECTION N/A 05/05/2019   Procedure: REPEAT CESAREAN SECTION;  Surgeon: Linzie Collin, MD;  Location: [facility] ORS;  Service: Obstetrics;  Laterality: N/A;   CESAREAN SECTION WITH BILATERAL TUBAL LIGATION Bilateral 03/25/2020   Procedure: CESAREAN SECTION WITH BILATERAL TUBAL LIGATION;  Surgeon: Hildred Laser, MD;  Location: [facility] ORS;  Service: Obstetrics;  Laterality: Bilateral;   Repeat   CORONARY ANGIOPLASTY WITH STENT PLACEMENT  03/2016   2 stents   DILATION AND CURETTAGE OF UTERUS  2014   EXCISION OF HYDRADENITIS  2009, 2012   REMOVAL OF SWEAT GLANDS   OTHER SURGICAL HISTORY     sweat gland excision   TONSILLECTOMY      Family History  Problem Relation Age of Onset   Hypertension Mother    Asthma Mother    Diabetes Mother    Hyperlipidemia Mother    Heart disease Mother    Heart failure Mother        Defib placement   Hypertension Father    Kidney disease Father    Heart disease Father    Cancer Maternal Aunt    Cancer Maternal Grandmother     Social History   Socioeconomic History   Marital status: Significant Other    Spouse name: Iantha Fallen   Number of children: 3   Years of education: Not on file   Highest education level: Not on file  Occupational History   Occupation: homemaker  Tobacco Use   Smoking status: Every Day    Packs/day: 0.25    Years: 10.00    Pack years: 2.50    Types: Cigarettes  Smokeless tobacco: Never   Tobacco comments:    maybe 3 a day  Vaping Use   Vaping Use: Never used  Substance and Sexual Activity   Alcohol use: No   Drug use: Not Currently    Types: Marijuana    Comment: uses to enhance appetite & to handle nausea   Sexual activity: Yes    Birth control/protection: None  Other Topics Concern   Not on file  Social History Narrative   Patient lives with significant other, his 36 year old and 2 toddlers   Social Determinants of Health   Financial Resource Strain: Not on file  Food Insecurity: Not on file  Transportation Needs: Not on file  Physical Activity: Not on file  Stress: Not on file  Social Connections: Not on file  Intimate Partner Violence: Not on file     Physical Exam   Vitals:   05/17/22 0014 05/17/22 0129  BP: (!) 143/87 137/80  Pulse: 81 73  Resp: 18 18  Temp:    SpO2: 98% 99%    CONSTITUTIONAL: Well-appearing, NAD NEURO/PSYCH:  Alert and oriented x 3, no focal  deficits EYES:  eyes equal and reactive ENT/NECK:  no LAD, no JVD CARDIO: Regular rate, well-perfused, normal S1 and S2 PULM:  CTAB no wheezing or rhonchi GI/GU:  non-distended, non-tender MSK/SPINE:  No gross deformities, no edema SKIN:  no rash, atraumatic   *Additional and/or pertinent findings included in MDM below  Diagnostic and Interventional Summary    EKG Interpretation  Date/Time:    Ventricular Rate:    PR Interval:    QRS Duration:   QT Interval:    QTC Calculation:   R Axis:     Text Interpretation:         Labs Reviewed  COMPREHENSIVE METABOLIC PANEL - Abnormal; Notable for the following components:      Result Value   Chloride 112 (*)    Glucose, Bld 114 (*)    AST 14 (*)    Total Bilirubin 0.2 (*)    Anion gap 3 (*)    All other components within normal limits  TSH - Abnormal; Notable for the following components:   TSH 0.107 (*)    All other components within normal limits  T4, FREE - Abnormal; Notable for the following components:   Free T4 1.22 (*)    All other components within normal limits  CBC  T3    CT Soft Tissue Neck W Contrast  Final Result      Medications  sodium chloride 0.9 % bolus 1,000 mL (1,000 mLs Intravenous New Bag/Given 05/16/22 2355)  ondansetron (ZOFRAN) injection 4 mg (4 mg Intravenous Given 05/16/22 2354)  iohexol (OMNIPAQUE) 300 MG/ML solution 75 mL (75 mLs Intravenous Contrast Given 05/17/22 0001)  fentaNYL (SUBLIMAZE) injection 50 mcg (50 mcg Intravenous Given 05/17/22 0053)  methylPREDNISolone sodium succinate (SOLU-MEDROL) 125 mg/2 mL injection 125 mg (125 mg Intravenous Given 05/17/22 0114)     Procedures  /  Critical Care Procedures  ED Course and Medical Decision Making  Initial Impression and Ddx Well-appearing in no acute distress, normal vital signs, does have a palpable thyroid goiter, nontender.  Odd that she is experiencing a mass that expands with swallowing, will obtain CT imaging to further  evaluate.  Past medical/surgical history that increases complexity of ED encounter: Thyroid disease  Interpretation of Diagnostics I personally reviewed the laboratory assessment and my interpretation is as follows: No significant blood count or electrolyte disturbance, thyroid  levels at or near baseline    CT imaging with heterogeneous thyroid that is enlarged, also a swollen lymph node  Patient Reassessment and Ultimate Disposition/Management Patient continues to look and feel well, will provide antibiotics to treat for possible strep throat and/or adenitis, will provide steroids to help with the swelling sensation and pain.  Appropriate for discharge with endocrinology follow-up.  Patient management required discussion with the following services or consulting groups:  None  Complexity of Problems Addressed Acute illness or injury that poses threat of life of bodily function  Additional Data Reviewed and Analyzed Further history obtained from: Prior labs/imaging results  Additional Factors Impacting ED Encounter Risk Prescriptions and Use of parenteral controlled substances  Elmer Sow. Pilar Plate, MD [facility] Emergency Medicine [facility] [email or web address]  Final Clinical Impressions(s) / ED Diagnoses     ICD-10-CM   1. Sore throat  J02.9     2. Enlarged thyroid  E04.9       ED Discharge Orders          Ordered    predniSONE (DELTASONE) 20 MG tablet  Daily        05/17/22 0159    clindamycin (CLEOCIN) 150 MG capsule  Every 6 hours        05/17/22 0159             Discharge Instructions Discussed with and Provided to Patient:    Discharge Instructions      You were evaluated in the Emergency Department and after careful evaluation, we did not find any emergent condition requiring admission or further testing in the hospital.  Your exam/testing today is overall reassuring.  Recommend follow-up with endocrinology to discuss your symptoms.   Recommend taking the clindamycin antibiotic as directed as well as the prednisone medicine for pain and swelling.  Please return to the Emergency Department if you experience any worsening of your condition.   Thank you for allowing [place] to be a part of your care.      Sabas Sous, MD 05/17/22 [phone]

## 2022-05-17 NOTE — Discharge Instructions (Addendum)
You were evaluated in the Emergency Department and after careful evaluation, we did not find any emergent condition requiring admission or further testing in the hospital.  Your exam/testing today is overall reassuring.  Recommend follow-up with endocrinology to discuss your symptoms.  Recommend taking the clindamycin antibiotic as directed as well as the prednisone medicine for pain and swelling.  Please return to the Emergency Department if you experience any worsening of your condition.   Thank you for allowing [place] to be a part of your care.

## 2022-05-18 LAB — T3: T3, Total: 107 ng/dL (ref 71–180)

## 2022-07-01 ENCOUNTER — Emergency Department (HOSPITAL_COMMUNITY): Payer: Medicaid Other

## 2022-07-01 ENCOUNTER — Emergency Department (HOSPITAL_COMMUNITY)
Admission: EM | Admit: 2022-07-01 | Discharge: 2022-07-02 | Disposition: A | Discharge status: Home / Self Care | Payer: Medicaid Other | Attending: Emergency Medicine | Admitting: Emergency Medicine

## 2022-07-01 ENCOUNTER — Encounter (HOSPITAL_COMMUNITY): Payer: Self-pay

## 2022-07-01 DIAGNOSIS — Y9301 Activity, walking, marching and hiking: Secondary | ICD-10-CM | POA: Diagnosis not present

## 2022-07-01 DIAGNOSIS — X509XXA Other and unspecified overexertion or strenuous movements or postures, initial encounter: Secondary | ICD-10-CM | POA: Diagnosis not present

## 2022-07-01 DIAGNOSIS — M25562 Pain in left knee: Secondary | ICD-10-CM | POA: Insufficient documentation

## 2022-07-01 MED ORDER — IBUPROFEN 800 MG PO TABS: 800.0000 mg | ORAL_TABLET | Freq: Three times a day (TID) | ORAL | 0 refills | Status: DC
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
## 2022-07-01 NOTE — ED Triage Notes (Signed)
Pt states that she was walking up the steps today and heard a pop in her L knee.

## 2022-07-01 NOTE — Discharge Instructions (Addendum)
Your x-ray was negative for fracture.  Your story is concerning for knee sprain.  I recommend that you follow-up with orthopedics.  You should splint the leg and use crutches until cleared by the orthopedic.  Keep your foot elevated and use ice as much as possible.

## 2022-07-02 NOTE — Progress Notes (Signed)
Orthopedic Tech Progress Note Patient Details:  Michelle Osborn 12-09-1986 [id number]  Ortho Devices Type of Ortho Device: Knee Immobilizer, Crutches Ortho Device/Splint Location: lle Ortho Device/Splint Interventions: Ordered, Application, Adjustment   Post Interventions Patient Tolerated: Well Instructions Provided: Care of device, Adjustment of device  [name] Post 07/02/2022, 12:55 AM

## 2022-07-04 ENCOUNTER — Emergency Department (HOSPITAL_BASED_OUTPATIENT_CLINIC_OR_DEPARTMENT_OTHER): Payer: Medicaid Other

## 2022-07-04 ENCOUNTER — Emergency Department (HOSPITAL_BASED_OUTPATIENT_CLINIC_OR_DEPARTMENT_OTHER): Payer: Medicaid Other | Admitting: Radiology

## 2022-07-04 ENCOUNTER — Emergency Department (HOSPITAL_BASED_OUTPATIENT_CLINIC_OR_DEPARTMENT_OTHER)
Admission: EM | Admit: 2022-07-04 | Discharge: 2022-07-05 | Disposition: A | Discharge status: Home / Self Care | Payer: Medicaid Other | Attending: Emergency Medicine | Admitting: Emergency Medicine

## 2022-07-04 ENCOUNTER — Encounter (HOSPITAL_BASED_OUTPATIENT_CLINIC_OR_DEPARTMENT_OTHER): Payer: Self-pay

## 2022-07-04 ENCOUNTER — Other Ambulatory Visit: Payer: Self-pay

## 2022-07-04 DIAGNOSIS — R0602 Shortness of breath: Secondary | ICD-10-CM | POA: Insufficient documentation

## 2022-07-04 DIAGNOSIS — R0789 Other chest pain: Secondary | ICD-10-CM | POA: Insufficient documentation

## 2022-07-04 DIAGNOSIS — N9489 Other specified conditions associated with female genital organs and menstrual cycle: Secondary | ICD-10-CM | POA: Diagnosis not present

## 2022-07-04 DIAGNOSIS — E119 Type 2 diabetes mellitus without complications: Secondary | ICD-10-CM | POA: Insufficient documentation

## 2022-07-04 DIAGNOSIS — R072 Precordial pain: Secondary | ICD-10-CM

## 2022-07-04 DIAGNOSIS — J45909 Unspecified asthma, uncomplicated: Secondary | ICD-10-CM | POA: Diagnosis not present

## 2022-07-04 DIAGNOSIS — Z91010 Allergy to peanuts: Secondary | ICD-10-CM | POA: Insufficient documentation

## 2022-07-04 DIAGNOSIS — I10 Essential (primary) hypertension: Secondary | ICD-10-CM | POA: Insufficient documentation

## 2022-07-04 LAB — CBC
HCT: 36.6 % (ref 36.0–46.0)
Hemoglobin: 12.2 g/dL (ref 12.0–15.0)
MCH: 27.4 pg (ref 26.0–34.0)
MCHC: 33.3 g/dL (ref 30.0–36.0)
MCV: 82.1 fL (ref 80.0–100.0)
Platelets: 294 10*3/uL (ref 150–400)
RBC: 4.46 MIL/uL (ref 3.87–5.11)
RDW: 13.4 % (ref 11.5–15.5)
WBC: 8.5 10*3/uL (ref 4.0–10.5)
nRBC: 0 % (ref 0.0–0.2)

## 2022-07-04 LAB — BASIC METABOLIC PANEL
Anion gap: 8 (ref 5–15)
BUN: 6 mg/dL (ref 6–20)
CO2: 21 mmol/L — ABNORMAL LOW (ref 22–32)
Calcium: 9.7 mg/dL (ref 8.9–10.3)
Chloride: 108 mmol/L (ref 98–111)
Creatinine, Ser: 0.63 mg/dL (ref 0.44–1.00)
GFR, Estimated: >60 mL/min (ref >60)
Glucose, Bld: 150 mg/dL — ABNORMAL HIGH (ref 70–99)
Potassium: 3.7 mmol/L (ref 3.5–5.1)
Sodium: 137 mmol/L (ref 135–145)

## 2022-07-04 LAB — HCG, SERUM, QUALITATIVE: Preg, Serum: NEGATIVE

## 2022-07-04 LAB — TROPONIN I (HIGH SENSITIVITY)
Troponin I (High Sensitivity): 3 ng/L (ref <18)
Troponin I (High Sensitivity): 3 ng/L (ref <18)

## 2022-07-04 MED ORDER — ALUM & MAG HYDROXIDE-SIMETH 200-200-20 MG/5ML PO SUSP
15.0000 mL | Freq: Once | ORAL | Status: AC
  Administered 2022-07-05: 15 mL via ORAL
  Filled 2022-07-04: qty 30

## 2022-07-04 MED ORDER — DICYCLOMINE HCL 20 MG PO TABS: 20.0000 mg | ORAL_TABLET | Freq: Three times a day (TID) | ORAL | 0 refills | Status: DC | PRN

## 2022-07-04 MED ORDER — OXYCODONE HCL 5 MG PO TABS
5.0000 mg | ORAL_TABLET | Freq: Once | ORAL | Status: AC
  Administered 2022-07-05: 5 mg via ORAL
  Filled 2022-07-04: qty 1

## 2022-07-04 MED ORDER — MORPHINE SULFATE (PF) 4 MG/ML IV SOLN
4.0000 mg | Freq: Once | INTRAVENOUS | Status: AC
  Administered 2022-07-04: 4 mg via INTRAVENOUS
  Filled 2022-07-04: qty 1

## 2022-07-04 MED ORDER — SODIUM CHLORIDE 0.9 % IV BOLUS
1000.0000 mL | Freq: Once | INTRAVENOUS | Status: AC
  Administered 2022-07-04: 1000 mL via INTRAVENOUS

## 2022-07-04 MED ORDER — IOHEXOL 350 MG/ML SOLN
75.0000 mL | Freq: Once | INTRAVENOUS | Status: AC | PRN
  Administered 2022-07-04: 75 mL via INTRAVENOUS

## 2022-07-04 MED ORDER — ONDANSETRON HCL 4 MG/2ML IJ SOLN
4.0000 mg | Freq: Once | INTRAMUSCULAR | Status: AC
Start: 2022-07-04 — End: 2022-07-04
  Administered 2022-07-04: 4 mg via INTRAVENOUS
  Filled 2022-07-04: qty 2

## 2022-07-04 MED ORDER — PANTOPRAZOLE SODIUM 40 MG PO TBEC: 40.0000 mg | DELAYED_RELEASE_TABLET | Freq: Every day | ORAL | 0 refills | Status: DC

## 2022-07-04 NOTE — ED Triage Notes (Signed)
Patient here POV from Home.  Endorses CP, SOB, Nausea that has been present for approximately 2-3 Days. CP is mainly oriented to Mid Chest and is Non-Radiating.  No Emesis. No Diarrhea. No Known Fevers. Pain is worse when Flat.  NAD Noted during Triage. A&Ox4. Gcs 15. BIB Personal Wheelchair.

## 2022-07-04 NOTE — ED Provider Notes (Signed)
[facility] EMERGENCY DEPT Provider Note   CSN: [id number] Arrival date & time: 07/04/22  1630     History  Chief Complaint  Patient presents with   Chest Pain    Michelle Osborn is a 36 y.o. female.  The history is provided by the patient and medical records. No language interpreter was used.  Chest Pain   36 year old female significant history of hypertension, diabetes, asthma, anemia, bipolar, prior MI presenting complaining of chest pain and shortness of breath.  Patient report for the past 2 days she has had persistent pressure in her chest, feeling short of breath, feeling fatigued, dehydrated, nausea without vomiting and decrease in appetite.  She mention her chest discomfort felt similar to prior heart attack that she has had in the past.  She states she has had cardiac stenting as well as has had prior stroke.  She believes she may have had blood clot in her legs previously but she is currently not on any blood thinner medication.  She admits to tobacco use but denies alcohol abuse.  She does endorse strong family history of cardiac disease including premature cardiac death.  She denies any specific treatment tried at home.  The symptoms moderate in severity.  She does not endorse runny nose sneezing or coughing no dysuria no vaginal bleeding or vaginal discharge no diarrhea or constipation.  Home Medications Prior to Admission medications   Medication Sig Start Date End Date Taking? Authorizing Provider  clindamycin (CLEOCIN) 150 MG capsule Take 1 capsule (150 mg total) by mouth every 6 (six) hours. 05/17/22   Sabas Sous, MD  ibuprofen (ADVIL) 800 MG tablet Take 1 tablet (800 mg total) by mouth 3 (three) times daily. 07/01/22   Roxy Horseman, PA-C  ondansetron (ZOFRAN) 4 MG tablet Take 1 tablet (4 mg total) by mouth every 6 (six) hours. 04/27/22   Carroll Sage, PA-C      Allergies    Darvocet [propoxyphene n-acetaminophen], Peanut-containing drug  products, Penicillins, Cephalexin, Toradol [ketorolac tromethamine], and Tramadol    Review of Systems   Review of Systems  Cardiovascular:  Positive for chest pain.  All other systems reviewed and are negative.   Physical Exam Updated Vital Signs BP 109/70   Pulse 86   Temp 99 F (37.2 C)   Resp 17   Ht 5\' 6"  (1.676 m)   Wt 91 kg   SpO2 100%   BMI 32.38 kg/m  Physical Exam Vitals and nursing note reviewed.  Constitutional:      General: She is not in acute distress.    Appearance: She is well-developed.  HENT:     Head: Atraumatic.  Eyes:     Conjunctiva/sclera: Conjunctivae normal.  Cardiovascular:     Rate and Rhythm: Normal rate and regular rhythm.     Pulses: Normal pulses.     Heart sounds: Normal heart sounds.  Pulmonary:     Effort: Pulmonary effort is normal.  Abdominal:     Palpations: Abdomen is soft.  Musculoskeletal:     Cervical back: Neck supple.     Right lower leg: No edema.     Left lower leg: No edema.  Skin:    Findings: No rash.  Neurological:     Mental Status: She is alert.  Psychiatric:        Mood and Affect: Mood normal.     ED Results / Procedures / Treatments   Labs (all labs ordered are listed, but only abnormal results are  displayed) Labs Reviewed  BASIC METABOLIC PANEL - Abnormal; Notable for the following components:      Result Value   CO2 21 (*)    Glucose, Bld 150 (*)    All other components within normal limits  SARS CORONAVIRUS 2 BY RT PCR  CBC  HCG, SERUM, QUALITATIVE  TROPONIN I (HIGH SENSITIVITY)  TROPONIN I (HIGH SENSITIVITY)    EKG None ED ECG REPORT   Date: 07/04/2022  Rate: 107  Rhythm: sinus tachycardia  QRS Axis: normal  Intervals: normal  ST/T Wave abnormalities: nonspecific ST changes  Conduction Disutrbances:none  Narrative Interpretation:   Old EKG Reviewed: unchanged  I have personally reviewed the EKG tracing and agree with the computerized printout as noted.   Radiology DG Chest 2  View  Result Date: 07/04/2022 CLINICAL DATA:  CP EXAM: CHEST - 2 VIEW COMPARISON:  April 11, 2022 FINDINGS: The heart size and mediastinal contours are within normal limits. Both lungs are clear. The visualized skeletal structures are unremarkable. IMPRESSION: No active cardiopulmonary disease. Electronically Signed   By: Marjo Bicker M.D.   On: 07/04/2022 17:26    Procedures Procedures    Medications Ordered in ED Medications  sodium chloride 0.9 % bolus 1,000 mL (has no administration in time range)  iohexol (OMNIPAQUE) 350 MG/ML injection 75 mL (has no administration in time range)  morphine (PF) 4 MG/ML injection 4 mg (4 mg Intravenous Given 07/04/22 2140)  ondansetron (ZOFRAN) injection 4 mg (4 mg Intravenous Given 07/04/22 2137)    ED Course/ Medical Decision Making/ A&P                           Medical Decision Making Amount and/or Complexity of Data Reviewed Labs: ordered. Radiology: ordered.  Risk Prescription drug management.   BP 109/70   Pulse 86   Temp 99 F (37.2 C)   Resp 17   Ht 5\' 6"  (1.676 m)   Wt 91 kg   SpO2 100%   BMI 32.38 kg/m   8:15 PM This is a 36 year old female with history of prior MI, prior stroke, diabetes, bipolar, presenting complaining of chest pain and shortness of breath.  Symptom has been an ongoing issue for the past 2 days, described as as a pressure sensation, and felt she could not catch her breath.  She mention it feels somewhat similar to prior MI.  She also report possible history of blood clot in her legs in the past but she is not on any blood thinner medication.  She also reports a strong family history of cardiac disease along with premature cardiac death.  She does not know why she developed a heart attack or prior stroke.  On exam, patient is well-appearing appears to be in no acute discomfort.  Heart and lung sounds normal.  No significant edema to her lower extremities bilaterally.  Vital signs remarkable for elevated  temperature of 99.0 orally.  Blood pressure is 109/70, normal heart rate, no hypoxia, no tachypnea  Since patient has moderate cardiac risk factor, will consider delta troponin.  This possible history of blood clot in the past therefore I will obtain chest CT angiogram to rule out PE.  Labs, EKG, and imaging independently viewed interpreted by me and I agree with radiology interpretation.  Labs are reassuring, mildly elevated CBG of 150 with normal anion gap.  Initial troponin is normal, pregnancy test is normal, chest x-ray without any active cardiopulmonary disease, and EKG  without concerning arrhythmia or ischemic changes  9:42 PM I reviewed patient's prior note, her last cardiac note was back in 2021 when she had a virtual visit.  She has had prior NSTEMI with drug-eluting stents to her LAD that was done from an outside hospital.  At this time, patient signed out to oncoming provider who will follow-up on her chest CT angiogram and her delta troponin along with reassessment.  As mentioned earlier EKG without concerning ischemic changes, initial troponin is normal and patient did receive morphine for pain as well as antinausea medication.  This patient presents to the ED for concern of CP/SOB, this involves an extensive number of treatment options, and is a complaint that carries with it a high risk of complications and morbidity.  The differential diagnosis includes ACS, PE, PNA, gastritis, PTX, costochondritis, aortic dissection  Co morbidities that complicate the patient evaluation prior MI  Prior stroke  DM Additional history obtained:  Additional history obtained from family member External records from outside source obtained and reviewed including prior ER visits, including prior labs and imaging  Lab Tests:  I Ordered, and personally interpreted labs.  The pertinent results include:  as above  Imaging Studies ordered:  I ordered imaging studies including CXR I independently  visualized and interpreted imaging which showed no acute finding I agree with the radiologist interpretation  Cardiac Monitoring:  The patient was maintained on a cardiac monitor.  I personally viewed and interpreted the cardiac monitored which showed an underlying rhythm of: NSR  Medicines ordered and prescription drug management:  I ordered medication including morpine  for cp Reevaluation of the patient after these medicines showed that the patient improved I have reviewed the patients home medicines and have made adjustments as needed  Test Considered: as above  Critical Interventions: as above   Problem List / ED Course: chest pain  SOB  Reevaluation:  After the interventions noted above, I reevaluated the patient and found that they have :improved  Social Determinants of Health: tobacco use  Lack of transportation  Food insecurity  Dispostion:  After consideration of the diagnostic results and the patients response to treatment, I feel that the patent would benefit from recommendation from oncoming provider.         Final Clinical Impression(s) / ED Diagnoses Final diagnoses:  None    Rx / DC Orders ED Discharge Orders     None         Fayrene Helper, PA-C 07/04/22 2143    Long, Arlyss Repress, MD 07/09/22 [phone]

## 2022-07-04 NOTE — ED Notes (Signed)
Pt refused Coronavirus nasal swab to be done.

## 2022-07-04 NOTE — Discharge Instructions (Signed)

## 2022-07-12 NOTE — Progress Notes (Deleted)
Cardiology Office Note    Date:  07/12/2022   ID:  Jacoby Ritsema, DOB Aug 05, 1986, MRN [id number]  PCP:  Patient, No Pcp Per  Cardiologist:  Julien Nordmann, MD  Electrophysiologist:  None   Chief Complaint: ED follow-up  History of Present Illness:   Michelle Osborn is a 36 y.o. female with history of CAD with NSTEMI status post DES to the LAD and D1 in 03/2016, DM2, hyperthyroidism, HTN, HLD, COPD, strong family history of premature coronary disease including her mother who passed at age 81 with an MI and an uncle that died at age 40 with an MI, PCOS, and tobacco/marijuana use who presents for ED follow-up as outlined below.  She was seen in the hospital several times in 2017 with chest pain with note indicating she was given "anxiety medications."  Dobutamine stress echo in 02/2016 was negative for inducible ischemia on both echo and EKG criteria.  She was subsequently admitted to an outside hospital in 03/2016 with an NSTEMI and underwent PCI/DES to the LAD and D1 in 03/2016.  There was otherwise nonobstructive disease in the LCx and RCA.  Echo in 10/2016 demonstrated an EF of 65 to 70%, no regional wall motion abnormalities, normal RV systolic function and ventricular cavity size, and trace mitral regurgitation.  Stress echo from [facility] in 01/2017 was normal and without evidence of inducible ischemia with an EF of 60 to 65%.  She established care with Dr. Mariah Milling in 12/2018.  She was admitted to the hospital in 08/2019 with chest pain and ruled out.  Echo demonstrated an EF of 60 to 65%, normal LV diastolic function parameters, normal LV cavity size, normal RV systolic function and ventricular cavity size, normal size aorta, and no significant valvular abnormalities.  She was most recently seen virtually in 01/2020 and reported having had multiple episodes of near syncope/syncope, having been evaluated in the ED in 11/2019 for an episode.  Subsequent outpatient cardiac monitoring in 01/2020  showed a predominant rhythm of sinus with 5 episodes of SVT with the longest episode lasting 8 beats.  Patient triggered events were not associated with significant arrhythmia.  She was seen in the ED in 03/2022 with chest pain, left leg pain, with subsequent development of pain along the entire left side of her body.  High-sensitivity troponin negative x2.  D-dimer normal.  Lower extremity ultrasound was negative for DVT in the bilateral lower extremities.  She was most recently seen in the ED on 07/04/2022 with chest pressure, dyspnea, fatigue, nausea, and a decrease in appetite.  EKG without acute ischemic changes.  High-sensitivity opponent negative x2.  CTA of the chest showed no evidence of PE or other acute intrathoracic process.  There was moderate coronary artery calcification, cholelithiasis, and a markedly enlarged and heterogeneous left lobe of the thyroid, along with aortic atherosclerosis.  She was discharged to outpatient follow-up.  *** Thyroid ultrasound   Labs independently reviewed: 06/2022 - Hgb 12.2, PLT 294, potassium 3.7, BUN 6, serum creatinine 0.63 04/2022 - TSH 0.107, free T4 elevated at 1.22, albumin 3.8, AST/ALT not elevated 12/2019 - A1c 4.8 11/2017 - TC 135, TG 53, HDL 32, LDL 91  Past Medical History:  Diagnosis Date   Anemia    Anginal pain (HCC)    on and off.    Anxiety    Asthma    "grew out' not since teenager   Bipolar 1 disorder (HCC)    Depression    Diabetes mellitus  not currently on meds, normal A1c   Discoloration of skin of foot 02/2020   pt wakes up with purple feet every am. it dissipates as day goes on.   Hydradenitis    Hypertension    Hyperthyroidism    Infection    UTI   MI (myocardial infarction) (HCC) 2017   Neuropathy    Syncope 02/2020   encouraged to hydrate and have snacks nearby. has passed out. happens several x a week.   Tachycardia    Thyroid disease     Past Surgical History:  Procedure Laterality Date    ADENOIDECTOMY     APPENDECTOMY     CARDIAC CATHETERIZATION  03/2016   [place], [place]    CESAREAN SECTION     CESAREAN SECTION N/A 05/05/2019   Procedure: REPEAT CESAREAN SECTION;  Surgeon: Linzie Collin, MD;  Location: [facility] ORS;  Service: Obstetrics;  Laterality: N/A;   CESAREAN SECTION WITH BILATERAL TUBAL LIGATION Bilateral 03/25/2020   Procedure: CESAREAN SECTION WITH BILATERAL TUBAL LIGATION;  Surgeon: Hildred Laser, MD;  Location: [facility] ORS;  Service: Obstetrics;  Laterality: Bilateral;  Repeat   CORONARY ANGIOPLASTY WITH STENT PLACEMENT  03/2016   2 stents   DILATION AND CURETTAGE OF UTERUS  2014   EXCISION OF HYDRADENITIS  2009, 2012   REMOVAL OF SWEAT GLANDS   OTHER SURGICAL HISTORY     sweat gland excision   TONSILLECTOMY      Current Medications: No outpatient medications have been marked as taking for the 07/13/22 encounter (Appointment) with Sondra Barges, PA-C.    Allergies:   Darvocet [propoxyphene n-acetaminophen], Peanut-containing drug products, Penicillins, Cephalexin, Toradol [ketorolac tromethamine], and Tramadol   Social History   Socioeconomic History   Marital status: Significant Other    Spouse name: Iantha Fallen   Number of children: 3   Years of education: Not on file   Highest education level: Not on file  Occupational History   Occupation: homemaker  Tobacco Use   Smoking status: Every Day    Packs/day: 0.25    Years: 10.00    Total pack years: 2.50    Types: Cigarettes   Smokeless tobacco: Never   Tobacco comments:    maybe 3 a day  Vaping Use   Vaping Use: Never used  Substance and Sexual Activity   Alcohol use: No   Drug use: Not Currently    Types: Marijuana    Comment: uses to enhance appetite & to handle nausea   Sexual activity: Yes    Birth control/protection: None  Other Topics Concern   Not on file  Social History Narrative   Patient lives with significant other, his 36 year old and 2 toddlers   Social Determinants of Health    Financial Resource Strain: Not on file  Food Insecurity: Food Insecurity Present (01/16/2019)   Hunger Vital Sign    Worried About Running Out of Food in the Last Year: Sometimes true    Ran Out of Food in the Last Year: Sometimes true  Transportation Needs: Unmet Transportation Needs (01/16/2019)   PRAPARE - Administrator, Civil Service (Medical): Yes    Lack of Transportation (Non-Medical): Yes  Physical Activity: Not on file  Stress: Not on file  Social Connections: Not on file     Family History:  The patient's family history includes Asthma in her mother; Cancer in her maternal aunt and maternal grandmother; Diabetes in her mother; Heart disease in her father and mother; Heart failure  in her mother; Hyperlipidemia in her mother; Hypertension in her father and mother; Kidney disease in her father.  ROS:   ROS   EKGs/Labs/Other Studies Reviewed:    Studies reviewed were summarized above. The additional studies were reviewed today:  Zio patch 01/2020: Normal sinus rhythm avg HR of 92 bpm.    5 Supraventricular Tachycardia runs occurred, the run with the fastest interval lasting 4 beats with a max rate of 160 bpm, the longest lasting 8 beats with an avg rate of 122 bpm.   Isolated SVEs were rare (<1.0%), SVCouplets were rare (<1.0%), and SVE Triplets were rare (<1.0%). Isolated VEs were rare (<1.0%), VE Couplets were rare (<1.0%), and no VE Triplets were present.   Patient triggered events not associated with significant arrhythmia __________  2D echo 08/30/2019: 1. The left ventricle has normal systolic function with an ejection  fraction of 60-65%. The cavity size was normal. Left ventricular diastolic  parameters were normal.   2. The right ventricle has normal systolic function. The cavity was  normal. There is no increase in right ventricular wall thickness.   3. The mitral valve is grossly normal.   4. The tricuspid valve is grossly normal.   5. The  aortic valve is tricuspid. No stenosis of the aortic valve.   6. The aorta is normal unless otherwise noted.   SUMMARY     Normal study  __________  Stress echo 02/21/2017 [facility]): Interpretation Summary  A complete exercise stress echocardiogram was performed ( 2-D, Color Flow,  and Doppler images obtained). This was a normal stress echocardiogram. No  inducible ischemia. Post exercise ECG demonstrates no new ST segment  depression.  The aortic valve is trileaflet.  The left ventricle is normal in size.  There is mild concentric left ventricular hypertrophy.  The left ventricular ejection fraction is normal (60-65%).  The left ventricular wall motion is normal.  The left ventricular diastolic function is normal.  The left atrium is mildly dilated.  Estimation of right ventricular systolic pressure is not possible.  No inducible ischemia.  __________  2D echo 11/09/2016 [facility]): Summary   1. Overall left ventricular ejection fraction is estimated at 65 to 70%.   2. Normal right ventricular size and systolic function.   3. Mild thickening of the anterior mitral valve leaflet.  __________  Dobutamine stress echo 03/27/2016 ([facility]):Summary:   1. Negative stress echocardiogram.   2. Negative for inducible ischemia based on both echo and ECG criteria.   EKG:  EKG is ordered today.  The EKG ordered today demonstrates ***  Recent Labs: 05/16/2022: ALT 15; TSH 0.107 07/04/2022: BUN 6; Creatinine, Ser 0.63; Hemoglobin 12.2; Platelets 294; Potassium 3.7; Sodium 137  Recent Lipid Panel    Component Value Date/Time   CHOL 176 11/18/2009 2237   TRIG 62 11/18/2009 2237   HDL 37 (L) 11/18/2009 2237   CHOLHDL 4.8 Ratio 11/18/2009 2237   VLDL 12 11/18/2009 2237   LDLCALC 127 (H) 11/18/2009 2237    PHYSICAL EXAM:    VS:  There were no vitals taken for this visit.  BMI: There is no height or weight on file to calculate BMI.  Physical Exam  Wt Readings  from Last 3 Encounters:  07/04/22 200 lb 9.9 oz (91 kg)  05/16/22 200 lb 9.9 oz (91 kg)  04/27/22 194 lb 0.1 oz (88 kg)     ASSESSMENT & PLAN:   CAD involving the native coronary arteries with ***and family history  of premature coronary disease:  History of syncope:  HTN: Blood pressure  HLD: LDL 91 in 11/2017.  Enlarged thyroid lobe:   {Are you ordering a CV Procedure (e.g. stress test, cath, DCCV, TEE, etc)?   Press F2        :[id number]}     Disposition: F/u with Dr. Mariah Milling or an APP in ***.   Medication Adjustments/Labs and Tests Ordered: Current medicines are reviewed at length with the patient today.  Concerns regarding medicines are outlined above. Medication changes, Labs and Tests ordered today are summarized above and listed in the Patient Instructions accessible in Encounters.   Signed, Eula Listen, PA-C 07/12/2022 12:38 PM     [facility] -  [address] [place], [place] [address] [phone]

## 2022-07-13 ENCOUNTER — Ambulatory Visit: Payer: Medicaid Other | Admitting: Physician Assistant

## 2022-07-16 ENCOUNTER — Encounter: Payer: Self-pay | Admitting: Physician Assistant

## 2022-08-23 ENCOUNTER — Ambulatory Visit (HOSPITAL_COMMUNITY)
Admission: EM | Admit: 2022-08-23 | Discharge: 2022-08-23 | Disposition: A | Discharge status: Home / Self Care | Payer: Medicaid Other | Attending: Internal Medicine | Admitting: Internal Medicine

## 2022-08-23 ENCOUNTER — Encounter (HOSPITAL_COMMUNITY): Payer: Self-pay | Admitting: Emergency Medicine

## 2022-08-23 DIAGNOSIS — S81812A Laceration without foreign body, left lower leg, initial encounter: Secondary | ICD-10-CM | POA: Diagnosis not present

## 2022-08-23 MED ORDER — LIDOCAINE-EPINEPHRINE 1 %-1:100000 IJ SOLN
INTRAMUSCULAR | Status: AC
  Filled 2022-08-23: qty 1

## 2022-08-23 NOTE — ED Triage Notes (Signed)
Patient c/o Laceration on LFT upper leg that happened yesterday at 1800.   Patient endorses " a knife in the kitchen cut my leg".   Patient has bandage present to site. Patient endorses pain.   Patient endorses last tetanus shot was April of this year.   Patient has used any medications for the symptoms.

## 2022-08-23 NOTE — ED Provider Notes (Signed)
[facility]    CSN: [id number] Arrival date & time: 08/23/22  1314      History   Chief Complaint Chief Complaint  Patient presents with   Laceration    HPI Michelle Osborn is a 36 y.o. female.   Patient presents urgent care for evaluation of laceration to the left leg superior to the left knee that happened at approximately 6 PM last night when she was in the kitchen and a new butcher knife fell off of the event and cut her left leg.  Last tetanus shot was in April 2023.  Patient is reporting a significant amount of pain to the leg but has full sensation and range of motion at the knee joint.  She is a diabetic but does not take any medications for this and does not have a primary care provider.  She states that she is in the process of attempting to find a PCP.  Bleeding was well controlled quickly with pressure.  Denies drainage and redness surrounding the site.  She has not attempted use of any over-the-counter medications prior to arrival urgent care for symptoms or pain.     Past Medical History:  Diagnosis Date   Anemia    Anginal pain (HCC)    on and off.    Anxiety    Asthma    "grew out' not since teenager   Bipolar 1 disorder (HCC)    Depression    Diabetes mellitus    not currently on meds, normal A1c   Discoloration of skin of foot 02/2020   pt wakes up with purple feet every am. it dissipates as day goes on.   Hydradenitis    Hypertension    Hyperthyroidism    Infection    UTI   MI (myocardial infarction) (HCC) 2017   Neuropathy    Syncope 02/2020   encouraged to hydrate and have snacks nearby. has passed out. happens several x a week.   Tachycardia    Thyroid disease     Patient Active Problem List   Diagnosis Date Noted   Postpartum anemia 03/27/2020   Keloid scar of skin 03/25/2020   S/P cesarean section 03/25/2020   Chronic hypertension in pregnancy 12/17/2019   UTI (urinary tract infection) during pregnancy 09/14/2019   Back  pain affecting pregnancy in third trimester 09/14/2019   Low TSH level 09/09/2019   Chest pain 08/30/2019   History of pre-eclampsia 05/01/2019   Supervision of high risk pregnancy, antepartum 01/16/2019   Diabetes mellitus complicating pregnancy 01/16/2019   Atherosclerosis of native coronary artery with stable angina pectoris (HCC) 01/09/2019   Tachycardia 01/09/2019   Mixed hyperlipidemia 01/09/2019   Hyperthyroidism affecting pregnancy in second trimester 01/07/2019   Lead exposure 12/06/2018   Smoker 12/06/2018   Bipolar 2 disorder (HCC) 12/06/2018   History of anxiety 12/06/2018   Mild intermittent asthma without complication 12/06/2018   Type 2 diabetes mellitus without complication, without long-term current use of insulin (HCC) 12/06/2018   High-risk pregnancy in second trimester 12/06/2018   History of heart attack 12/06/2018   Graves disease 10/30/2018   History of cesarean section 10/30/2018   Nausea/vomiting in pregnancy 10/30/2018   History of marijuana use 10/30/2018   HYPERCHOLESTEROLEMIA 12/08/2009   HIDRADENITIS SUPPURATIVA 12/08/2009   VITAMIN D DEFICIENCY 11/18/2009   GERD 11/18/2009   MENORRHAGIA 11/18/2009   Diabetes mellitus type 2 with complications (HCC) 11/17/2009   Essential hypertension 11/17/2009   ASTHMA 11/17/2009    Past  Surgical History:  Procedure Laterality Date   ADENOIDECTOMY     APPENDECTOMY     CARDIAC CATHETERIZATION  03/2016   [place], [place] N/A 05/05/2019   Procedure: REPEAT CESAREAN SECTION;  Surgeon: Osborn Heys, MD;  Location: [facility];  Service: Obstetrics;  Laterality: N/A;   CESAREAN SECTION WITH BILATERAL TUBAL LIGATION Bilateral 03/25/2020   Procedure: CESAREAN SECTION WITH BILATERAL TUBAL LIGATION;  Surgeon: Rubie Maid, MD;  Location: [facility];  Service: Obstetrics;  Laterality: Bilateral;  Repeat   CORONARY ANGIOPLASTY WITH STENT PLACEMENT  03/2016   2 stents   DILATION AND  CURETTAGE OF UTERUS  2014   EXCISION OF HYDRADENITIS  2009, 2012   REMOVAL OF SWEAT GLANDS   OTHER SURGICAL HISTORY     sweat gland excision   TONSILLECTOMY      OB History     Gravida  3   Para  3   Term  3   Preterm      AB      Living  3      SAB      IAB      Ectopic      Multiple  0   Live Births  3            Home Medications    Prior to Admission medications   Medication Sig Start Date End Date Taking? Authorizing Provider  clindamycin (CLEOCIN) 150 MG capsule Take 1 capsule (150 mg total) by mouth every 6 (six) hours. 05/17/22   Maudie Flakes, MD  dicyclomine (BENTYL) 20 MG tablet Take 1 tablet (20 mg total) by mouth 3 (three) times daily as needed for spasms. 07/04/22   Long, Wonda Olds, MD  ibuprofen (ADVIL) 800 MG tablet Take 1 tablet (800 mg total) by mouth 3 (three) times daily. 07/01/22   Montine Circle, PA-C  ondansetron (ZOFRAN) 4 MG tablet Take 1 tablet (4 mg total) by mouth every 6 (six) hours. 04/27/22   Marcello Fennel, PA-C  pantoprazole (PROTONIX) 40 MG tablet Take 1 tablet (40 mg total) by mouth daily. 07/04/22 08/03/22  Long, Wonda Olds, MD    Family History Family History  Problem Relation Age of Onset   Hypertension Mother    Asthma Mother    Diabetes Mother    Hyperlipidemia Mother    Heart disease Mother    Heart failure Mother        Defib placement   Hypertension Father    Kidney disease Father    Heart disease Father    Cancer Maternal Aunt    Cancer Maternal Grandmother     Social History Social History   Tobacco Use   Smoking status: Every Day    Packs/day: 0.25    Years: 10.00    Total pack years: 2.50    Types: Cigarettes   Smokeless tobacco: Never   Tobacco comments:    maybe 3 a day  Vaping Use   Vaping Use: Never used  Substance Use Topics   Alcohol use: No   Drug use: Not Currently    Types: Marijuana    Comment: uses to enhance appetite & to handle nausea     Allergies   Darvocet [propoxyphene  n-acetaminophen], Peanut-containing drug products, Penicillins, Cephalexin, Toradol [ketorolac tromethamine], and Tramadol   Review of Systems Review of Systems Per HPI  Physical Exam Triage Vital Signs ED Triage Vitals  Enc Vitals Group  BP 08/23/22 1340 114/69     Pulse Rate 08/23/22 1340 80     Resp 08/23/22 1340 16     Temp 08/23/22 1340 98.7 F (37.1 C)     Temp Source 08/23/22 1340 Oral     SpO2 08/23/22 1340 98 %     Weight --      Height --      Head Circumference --      Peak Flow --      Pain Score 08/23/22 1346 9     Pain Loc --      Pain Edu? --      Excl. in [place]? --    No data found.  Updated Vital Signs BP 114/69 (BP Location: Left Arm)   Pulse 80   Temp 98.7 F (37.1 C) (Oral)   Resp 16   LMP 08/20/2022   SpO2 98%   Visual Acuity Right Eye Distance:   Left Eye Distance:   Bilateral Distance:    Right Eye Near:   Left Eye Near:    Bilateral Near:     Physical Exam        UC Treatments / Results  Labs (all labs ordered are listed, but only abnormal results are displayed) Labs Reviewed - No data to display  EKG   Radiology No results found.  Procedures Laceration Repair  Date/Time: 08/23/2022 3:09 PM  Performed by: Talbot Grumbling, FNP Authorized by: Talbot Grumbling, FNP   Consent:    Consent obtained:  Verbal   Consent given by:  Patient   Risks, benefits, and alternatives were discussed: yes     Risks discussed:  Infection, pain, retained foreign body, poor cosmetic result, poor wound healing, need for additional repair and nerve damage   Alternatives discussed:  No treatment Universal protocol:    Procedure explained and questions answered to patient or proxy's satisfaction: yes     Patient identity confirmed:  Verbally with patient Anesthesia:    Anesthesia method:  Local infiltration   Local anesthetic:  Lidocaine 1% WITH epi Laceration details:    Location:  Leg   Leg location:  L upper leg   Length  (cm):  3   Depth (mm):  5 Treatment:    Area cleansed with:  Povidone-iodine and soap and water   Amount of cleaning:  Standard   Visualized foreign bodies/material removed: no     Debridement:  None   Undermining:  None Skin repair:    Repair method:  Sutures   Suture size:  4-0   Suture material:  Nylon   Suture technique:  Simple interrupted   Number of sutures:  5 Approximation:    Approximation:  Close Repair type:    Repair type:  Simple Post-procedure details:    Dressing:  Non-adherent dressing   Procedure completion:  Tolerated well, no immediate complications  (including critical care time)  Medications Ordered in UC Medications - No data to display  Initial Impression / Assessment and Plan / UC Course  I have reviewed the triage vital signs and the nursing notes.  Pertinent labs & imaging results that were available during my care of the patient were reviewed by me and considered in my medical decision making (see chart for details).   1.  Laceration of left lower extremity initial encounter See laceration repair note above for further detail regarding procedure.  Wound cleansed and dressed in clinic.  Patient advised to change dressings twice daily with nonstick gauze  to the wound.  She is to avoid placing ointments, lotions, or powders to the wound until wound heals completely.  She needs to keep the site dry for the next 24 hours.  After 24 hours, she may get the wound wet in the shower but may not scrub the area as this could cause bleeding.  Suture removal in 10 days advised.  Patient to return to urgent care sooner if signs of infection or noted.  Discussed ER and urgent care return precautions.  Patient agreeable with plan.  No indication for antibiotic therapy at this time as the wound does not appear infected.   Discussed physical exam and available lab work findings in clinic with patient.  Counseled patient regarding appropriate use of medications and potential  side effects for all medications recommended or prescribed today. Discussed red flag signs and symptoms of worsening condition,when to call the PCP office, return to urgent care, and when to seek higher level of care in the emergency department. Patient verbalizes understanding and agreement with plan. All questions answered. Patient discharged in stable condition.   Final Clinical Impressions(s) / UC Diagnoses   Final diagnoses:  Laceration of left lower extremity, initial encounter     Discharge Instructions      Wound care: Please keep the area surrounding the wound/sutures clean and dry for the next 24 hours. After 24 hours, you may get the wound wet. Gently clean wound with antibacterial soap. Do not scrub wound. Cover the area with a nonstick bandage and change the bandage 2 times a day.   You should have the sutures removed in 10 days by your primary care provider or at urgent care. Return sooner than 10 days if you experience discharge from your laceration, redness around your laceration, warmth around your laceration, or fever.   We recommend you take 600mg  ibuprofen every 6 hours or tylenol 650mg  every 6 hours as needed for pain. If needed, you can alternate these medications so that you take one medication every 3 hours. For instance, at noon take ibuprofen, then at 3pm take tylenol, then at 6pm take ibuprofen.   I hope you feel better! You were a champ!      ED Prescriptions   None    PDMP not reviewed this encounter.   , 08/23/22 1512

## 2022-08-23 NOTE — Discharge Instructions (Signed)
Wound care: Please keep the area surrounding the wound/sutures clean and dry for the next 24 hours. After 24 hours, you may get the wound wet. Gently clean wound with antibacterial soap. Do not scrub wound. Cover the area with a nonstick bandage and change the bandage 2 times a day.   You should have the sutures removed in 10 days by your primary care provider or at urgent care. Return sooner than 10 days if you experience discharge from your laceration, redness around your laceration, warmth around your laceration, or fever.   We recommend you take 600mg  ibuprofen every 6 hours or tylenol 650mg  every 6 hours as needed for pain. If needed, you can alternate these medications so that you take one medication every 3 hours. For instance, at noon take ibuprofen, then at 3pm take tylenol, then at 6pm take ibuprofen.   I hope you feel better! You were a champ!

## 2022-08-29 ENCOUNTER — Telehealth: Payer: Medicaid Other | Admitting: Family Medicine

## 2022-08-29 DIAGNOSIS — E119 Type 2 diabetes mellitus without complications: Secondary | ICD-10-CM | POA: Diagnosis not present

## 2022-08-29 DIAGNOSIS — S81812D Laceration without foreign body, left lower leg, subsequent encounter: Secondary | ICD-10-CM

## 2022-08-29 DIAGNOSIS — L089 Local infection of the skin and subcutaneous tissue, unspecified: Secondary | ICD-10-CM | POA: Diagnosis not present

## 2022-08-29 MED ORDER — DOXYCYCLINE HYCLATE 100 MG PO TABS: 100.0000 mg | ORAL_TABLET | Freq: Two times a day (BID) | ORAL | 0 refills | Status: AC

## 2022-08-29 NOTE — Patient Instructions (Signed)
  Neldon Mc, thank you for joining [name], NP for today's virtual visit.  While this provider is not your primary care provider (PCP), if your PCP is located in our provider database this encounter information will be shared with them immediately following your visit.  Consent: (Patient) Neldon Mc provided verbal consent for this virtual visit at the beginning of the encounter.  Current Medications:  Current Outpatient Medications:    doxycycline (VIBRA-TABS) 100 MG tablet, Take 1 tablet (100 mg total) by mouth 2 (two) times daily for 10 days., Disp: 20 tablet, Rfl: 0   clindamycin (CLEOCIN) 150 MG capsule, Take 1 capsule (150 mg total) by mouth every 6 (six) hours., Disp: 28 capsule, Rfl: 0   dicyclomine (BENTYL) 20 MG tablet, Take 1 tablet (20 mg total) by mouth 3 (three) times daily as needed for spasms., Disp: 20 tablet, Rfl: 0   ibuprofen (ADVIL) 800 MG tablet, Take 1 tablet (800 mg total) by mouth 3 (three) times daily., Disp: 21 tablet, Rfl: 0   ondansetron (ZOFRAN) 4 MG tablet, Take 1 tablet (4 mg total) by mouth every 6 (six) hours., Disp: 12 tablet, Rfl: 0   pantoprazole (PROTONIX) 40 MG tablet, Take 1 tablet (40 mg total) by mouth daily., Disp: 30 tablet, Rfl: 0   Medications ordered in this encounter:  Meds ordered this encounter  Medications   doxycycline (VIBRA-TABS) 100 MG tablet    Sig: Take 1 tablet (100 mg total) by mouth 2 (two) times daily for 10 days.    Dispense:  20 tablet    Refill:  0    Order Specific Question:   Supervising Provider    Answer:   Hyacinth Meeker, BRIAN [[id number]]     *If you need refills on other medications prior to your next appointment, please contact your pharmacy*  Follow-Up: Call back or seek an in-person evaluation if the symptoms worsen or if the condition fails to improve as anticipated.  Other Instructions Take meds as directed  Continue to keep clean and dry  Monitor blood sugars  Follow up for suture remove as  planned  If you have been instructed to have an in-person evaluation today at a local Urgent Care facility, please use the link below. It will take you to a list of all of our available [facility], including address, phone number and hours of operation. Please do not delay care.  [facility]  If you or a family member do not have a primary care provider, use the link below to schedule a visit and establish care. When you choose a [facility] primary care physician or advanced practice provider, you gain a long-term partner in health. Find a Primary Care Provider  Learn more about [facility] in-office and virtual care options: [facility] - Get Care Now

## 2022-08-29 NOTE — Progress Notes (Signed)
Virtual Visit Consent   Michelle Osborn, you are scheduled for a virtual visit with a [facility] provider today. Just as with appointments in the office, your consent must be obtained to participate. Your consent will be active for this visit and any virtual visit you may have with one of our providers in the next 365 days. If you have a MyChart account, a copy of this consent can be sent to you electronically.  As this is a virtual visit, video technology does not allow for your provider to perform a traditional examination. This may limit your provider's ability to fully assess your condition. If your provider identifies any concerns that need to be evaluated in person or the need to arrange testing (such as labs, EKG, etc.), we will make arrangements to do so. Although advances in technology are sophisticated, we cannot ensure that it will always work on either your end or our end. If the connection with a video visit is poor, the visit may have to be switched to a telephone visit. With either a video or telephone visit, we are not always able to ensure that we have a secure connection.  By engaging in this virtual visit, you consent to the provision of healthcare and authorize for your insurance to be billed (if applicable) for the services provided during this visit. Depending on your insurance coverage, you may receive a charge related to this service.  I need to obtain your verbal consent now. Are you willing to proceed with your visit today? [name] has provided verbal consent on 08/29/2022 for a virtual visit (video or telephone). [name], NP  Date: 08/29/2022 2:49 PM  Virtual Visit via Video Note   I, [name], connected with  Michelle Osborn  ([id number], [birth year]) on 08/29/22 at  3:00 PM EDT by a video-enabled telemedicine application and verified that I am speaking with the correct person using two identifiers.  Location: Patient: Virtual Visit Location Patient:  Home Provider: Virtual Visit Location Provider: Home Office   I discussed the limitations of evaluation and management by telemedicine and the availability of in person appointments. The patient expressed understanding and agreed to proceed.    History of Present Illness: Michelle Osborn is a 36 y.o. who identifies as a female who was assigned female at birth, and is being seen today for reports for skin infection at a laceration site that she was seen for in 8/24. Sutures intact. Started to having crusting at site, redness, swelling, and warmth. No active drainage or bleeding. Is a known DM and monitors blood sugars at home. She was no placed on an Abx at time of suture as it appeared well and without puncture. And was up to date on tetanus. Denies n/v/ fevers or chills   Problems:  Patient Active Problem List   Diagnosis Date Noted   Postpartum anemia 03/27/2020   Keloid scar of skin 03/25/2020   S/P cesarean section 03/25/2020   Chronic hypertension in pregnancy 12/17/2019   UTI (urinary tract infection) during pregnancy 09/14/2019   Back pain affecting pregnancy in third trimester 09/14/2019   Low TSH level 09/09/2019   Chest pain 08/30/2019   History of pre-eclampsia 05/01/2019   Supervision of high risk pregnancy, antepartum 01/16/2019   Diabetes mellitus complicating pregnancy 01/16/2019   Atherosclerosis of native coronary artery with stable angina pectoris (HCC) 01/09/2019   Tachycardia 01/09/2019   Mixed hyperlipidemia 01/09/2019   Hyperthyroidism affecting pregnancy in second trimester 01/07/2019   Lead  exposure 12/06/2018   Smoker 12/06/2018   Bipolar 2 disorder (HCC) 12/06/2018   History of anxiety 12/06/2018   Mild intermittent asthma without complication 12/06/2018   Type 2 diabetes mellitus without complication, without long-term current use of insulin (HCC) 12/06/2018   High-risk pregnancy in second trimester 12/06/2018   History of heart attack 12/06/2018   Graves  disease 10/30/2018   History of cesarean section 10/30/2018   Nausea/vomiting in pregnancy 10/30/2018   History of marijuana use 10/30/2018   HYPERCHOLESTEROLEMIA 12/08/2009   HIDRADENITIS SUPPURATIVA 12/08/2009   VITAMIN D DEFICIENCY 11/18/2009   GERD 11/18/2009   MENORRHAGIA 11/18/2009   Diabetes mellitus type 2 with complications (HCC) 11/17/2009   Essential hypertension 11/17/2009   ASTHMA 11/17/2009    Allergies:  Allergies  Allergen Reactions   Darvocet [Propoxyphene N-Acetaminophen] Anaphylaxis    Also makes her stomach hurt   Peanut-Containing Drug Products Shortness Of Breath and Swelling   Penicillins Shortness Of Breath and Swelling    Did it involve swelling of the face/tongue/throat, SOB, or low BP? Yes Did it involve sudden or severe rash/hives, skin peeling, or any reaction on the inside of your mouth or nose? No Did you need to seek medical attention at a hospital or doctor's office? Yes When did it last happen?      2007 If all above answers are "NO", may proceed with cephalosporin use.    Cephalexin Hives and Itching   Toradol [Ketorolac Tromethamine] Rash    Red rash with bumps   Tramadol Nausea And Vomiting   Medications:  Current Outpatient Medications:    clindamycin (CLEOCIN) 150 MG capsule, Take 1 capsule (150 mg total) by mouth every 6 (six) hours., Disp: 28 capsule, Rfl: 0   dicyclomine (BENTYL) 20 MG tablet, Take 1 tablet (20 mg total) by mouth 3 (three) times daily as needed for spasms., Disp: 20 tablet, Rfl: 0   ibuprofen (ADVIL) 800 MG tablet, Take 1 tablet (800 mg total) by mouth 3 (three) times daily., Disp: 21 tablet, Rfl: 0   ondansetron (ZOFRAN) 4 MG tablet, Take 1 tablet (4 mg total) by mouth every 6 (six) hours., Disp: 12 tablet, Rfl: 0   pantoprazole (PROTONIX) 40 MG tablet, Take 1 tablet (40 mg total) by mouth daily., Disp: 30 tablet, Rfl: 0  Observations/Objective: Patient is well-developed, well-nourished in no acute distress.  Resting  comfortably  at home.  Head is normocephalic, atraumatic.  No labored breathing.  Speech is clear and coherent with logical content.  Patient is alert and oriented at baseline.  Left laceration to left lower leg- sutures are intact- there is redness, warmth, and mild swelling at site. Noted bruising to distal area. Crusting some around sutures  Assessment and Plan: 1. Skin infection  -appears to be starting to show infection signs, known DM2 as well Will treat with Doxy for MRSA coverage also -continue to keeping area clean and dry Follow up for suture remove as planned  - doxycycline (VIBRA-TABS) 100 MG tablet; Take 1 tablet (100 mg total) by mouth 2 (two) times daily for 10 days.  Dispense: 20 tablet; Refill: 0   2. Laceration of left lower extremity, subsequent encounter  Sutures intact treatment as above   3. Type 2 diabetes mellitus without complication, without long-term current use of insulin (HCC)  -advised to monitor for elevated BS while infection is present   Reviewed side effects, risks and benefits of medication.    Patient acknowledged agreement and understanding of the plan.  Past Medical, Surgical, Social History, Allergies, and Medications have been Reviewed.    Follow Up Instructions: I discussed the assessment and treatment plan with the patient. The patient was provided an opportunity to ask questions and all were answered. The patient agreed with the plan and demonstrated an understanding of the instructions.  A copy of instructions were sent to the patient via MyChart unless otherwise noted below.    The patient was advised to call back or seek an in-person evaluation if the symptoms worsen or if the condition fails to improve as anticipated.  Time:  I spent 10 minutes with the patient via telehealth technology discussing the above problems/concerns.    Perlie Mayo, NP

## 2022-09-28 ENCOUNTER — Other Ambulatory Visit: Payer: Self-pay | Admitting: Nurse Practitioner

## 2022-09-28 DIAGNOSIS — K8020 Calculus of gallbladder without cholecystitis without obstruction: Secondary | ICD-10-CM

## 2022-10-04 ENCOUNTER — Other Ambulatory Visit: Payer: Self-pay

## 2022-10-04 ENCOUNTER — Ambulatory Visit
Admission: RE | Admit: 2022-10-04 | Discharge: 2022-10-04 | Disposition: A | Discharge status: Home / Self Care | Payer: Medicaid Other | Source: Ambulatory Visit | Attending: Nurse Practitioner | Admitting: Nurse Practitioner

## 2022-10-04 ENCOUNTER — Emergency Department (HOSPITAL_COMMUNITY): Payer: Medicaid Other

## 2022-10-04 ENCOUNTER — Emergency Department (HOSPITAL_COMMUNITY)
Admission: EM | Admit: 2022-10-04 | Discharge: 2022-10-05 | Disposition: A | Discharge status: Home / Self Care | Payer: Medicaid Other | Attending: Emergency Medicine | Admitting: Emergency Medicine

## 2022-10-04 ENCOUNTER — Encounter (HOSPITAL_COMMUNITY): Payer: Self-pay

## 2022-10-04 DIAGNOSIS — Z91010 Allergy to peanuts: Secondary | ICD-10-CM | POA: Diagnosis not present

## 2022-10-04 DIAGNOSIS — J45909 Unspecified asthma, uncomplicated: Secondary | ICD-10-CM | POA: Diagnosis not present

## 2022-10-04 DIAGNOSIS — E039 Hypothyroidism, unspecified: Secondary | ICD-10-CM | POA: Insufficient documentation

## 2022-10-04 DIAGNOSIS — K8020 Calculus of gallbladder without cholecystitis without obstruction: Secondary | ICD-10-CM | POA: Insufficient documentation

## 2022-10-04 DIAGNOSIS — R112 Nausea with vomiting, unspecified: Secondary | ICD-10-CM

## 2022-10-04 DIAGNOSIS — I10 Essential (primary) hypertension: Secondary | ICD-10-CM | POA: Insufficient documentation

## 2022-10-04 LAB — URINALYSIS, ROUTINE W REFLEX MICROSCOPIC
Bilirubin Urine: NEGATIVE
Glucose, UA: NEGATIVE mg/dL
Hgb urine dipstick: NEGATIVE
Ketones, ur: NEGATIVE mg/dL
Nitrite: NEGATIVE
Protein, ur: NEGATIVE mg/dL
Specific Gravity, Urine: 1.014 (ref 1.005–1.030)
pH: 5 (ref 5.0–8.0)

## 2022-10-04 LAB — COMPREHENSIVE METABOLIC PANEL
ALT: 16 U/L (ref 0–44)
AST: 17 U/L (ref 15–41)
Albumin: 3.8 g/dL (ref 3.5–5.0)
Alkaline Phosphatase: 124 U/L (ref 38–126)
Anion gap: 8 (ref 5–15)
BUN: 10 mg/dL (ref 6–20)
CO2: 23 mmol/L (ref 22–32)
Calcium: 9.9 mg/dL (ref 8.9–10.3)
Chloride: 110 mmol/L (ref 98–111)
Creatinine, Ser: 0.75 mg/dL (ref 0.44–1.00)
GFR, Estimated: >60 mL/min (ref >60)
Glucose, Bld: 104 mg/dL — ABNORMAL HIGH (ref 70–99)
Potassium: 4.3 mmol/L (ref 3.5–5.1)
Sodium: 141 mmol/L (ref 135–145)
Total Bilirubin: 0.2 mg/dL — ABNORMAL LOW (ref 0.3–1.2)
Total Protein: 7.7 g/dL (ref 6.5–8.1)

## 2022-10-04 LAB — CBC
HCT: 39.5 % (ref 36.0–46.0)
Hemoglobin: 13.1 g/dL (ref 12.0–15.0)
MCH: 27.9 pg (ref 26.0–34.0)
MCHC: 33.2 g/dL (ref 30.0–36.0)
MCV: 84.2 fL (ref 80.0–100.0)
Platelets: 325 10*3/uL (ref 150–400)
RBC: 4.69 MIL/uL (ref 3.87–5.11)
RDW: 13.9 % (ref 11.5–15.5)
WBC: 9.2 10*3/uL (ref 4.0–10.5)
nRBC: 0 % (ref 0.0–0.2)

## 2022-10-04 LAB — I-STAT BETA HCG BLOOD, ED (MC, WL, AP ONLY): I-stat hCG, quantitative: <5 m[IU]/mL (ref <5)

## 2022-10-04 LAB — LIPASE, BLOOD: Lipase: 35 U/L (ref 11–51)

## 2022-10-04 MED ORDER — ONDANSETRON 4 MG PO TBDP
4.0000 mg | ORAL_TABLET | Freq: Once | ORAL | Status: AC | PRN
  Administered 2022-10-05: 4 mg via ORAL
  Filled 2022-10-04: qty 1

## 2022-10-04 MED ORDER — ONDANSETRON 4 MG PO TBDP
4.0000 mg | ORAL_TABLET | Freq: Once | ORAL | Status: AC | PRN
  Administered 2022-10-04: 4 mg via ORAL
  Filled 2022-10-04: qty 1

## 2022-10-04 MED ORDER — OXYCODONE-ACETAMINOPHEN 5-325 MG PO TABS
1.0000 | ORAL_TABLET | Freq: Once | ORAL | Status: AC
  Administered 2022-10-04: 1 via ORAL
  Filled 2022-10-04: qty 1

## 2022-10-04 NOTE — ED Provider Triage Note (Addendum)
Emergency Medicine Provider Triage Evaluation Note  Michelle Osborn , a 36 y.o. female  was evaluated in triage.  Pt complains of right upper quadrant pain.  Patient with known gallstones.  The patient was seen at her PCP and advised to return to the ED for further management.  Patient has known gallstones.  The patient is unsure of her last ultrasound of her gallbladder.  The patient states this has been ongoing for 4 days.  The patient states that she has been nauseous and vomiting.  Patient denies any fevers.  Review of Systems  Positive:  Negative:   Physical Exam  BP (!) 152/91 (BP Location: Left Arm)   Pulse 78   Temp 98.2 F (36.8 C) (Oral)   Resp 18   Ht 5\' 6"  (1.676 m)   Wt 90.7 kg   SpO2 100%   BMI 32.28 kg/m  Gen:   Awake, no distress   Resp:  Normal effort  MSK:   Moves extremities without difficulty  Other:  Positive Murphy sign  Medical Decision Making  Medically screening exam initiated at 3:26 PM.  Appropriate orders placed.  Idy Rawling was informed that the remainder of the evaluation will be completed by another provider, this initial triage assessment does not replace that evaluation, and the importance of remaining in the ED until their evaluation is complete.       Azucena Cecil, PA-C 10/04/22 2120

## 2022-10-04 NOTE — ED Triage Notes (Signed)
RUQ pain and vomiting. Known gallstones.  This episode has been going on for 3 days.

## 2022-10-05 MED ORDER — SUCRALFATE 1 G PO TABS
1.0000 g | ORAL_TABLET | Freq: Once | ORAL | Status: AC
  Administered 2022-10-05: 1 g via ORAL
  Filled 2022-10-05: qty 1

## 2022-10-05 MED ORDER — ACETAMINOPHEN 500 MG PO TABS: 1000.0000 mg | ORAL_TABLET | Freq: Once | ORAL | Status: DC

## 2022-10-05 MED ORDER — LIDOCAINE VISCOUS HCL 2 % MT SOLN
15.0000 mL | Freq: Once | OROMUCOSAL | Status: DC
  Filled 2022-10-05: qty 15

## 2022-10-05 MED ORDER — SUCRALFATE 1 GM/10ML PO SUSP: 1.0000 g | Freq: Three times a day (TID) | ORAL | 0 refills | Status: AC

## 2022-10-05 MED ORDER — ALUM & MAG HYDROXIDE-SIMETH 200-200-20 MG/5ML PO SUSP
30.0000 mL | Freq: Once | ORAL | Status: DC
  Filled 2022-10-05: qty 30

## 2022-10-05 MED ORDER — SUCRALFATE 1 G PO TABS: 1.0000 g | ORAL_TABLET | Freq: Three times a day (TID) | ORAL | 0 refills | Status: DC

## 2022-10-05 MED ORDER — ONDANSETRON 4 MG PO TBDP: 4.0000 mg | ORAL_TABLET | Freq: Three times a day (TID) | ORAL | 0 refills | Status: AC | PRN

## 2022-10-05 NOTE — ED Notes (Signed)
ED provider at bedside.

## 2022-10-05 NOTE — ED Provider Notes (Addendum)
[facility] EMERGENCY DEPARTMENT Provider Note  CSN: [id number] Arrival date & time: 10/04/22 1339  Chief Complaint(s) Abdominal Pain  HPI Michelle Osborn is a 36 y.o. female     Abdominal Pain Pain location:  RUQ Pain quality: aching, sharp and stabbing   Pain radiates to:  Does not radiate Pain severity:  Moderate Onset quality:  Gradual Duration: several months. Timing:  Constant Progression:  Waxing and waning Chronicity:  New Context: not alcohol use, not sick contacts and not suspicious food intake   Context comment:  Known h/o gallstones Relieved by:  Position changes Worsened by:  Eating, position changes and vomiting Ineffective treatments:  Acetaminophen and NSAIDs Associated symptoms: nausea and vomiting   Associated symptoms: no chills, no cough, no dysuria, no fatigue, no fever, no flatus and no shortness of breath     Past Medical History Past Medical History:  Diagnosis Date   Anemia    Anginal pain ([place])    on and off.    Anxiety    Asthma    "grew out' not since teenager   Bipolar 1 disorder ([place])    Depression    Diabetes mellitus    not currently on meds, normal A1c   Discoloration of skin of foot 02/2020   pt wakes up with purple feet every am. it dissipates as day goes on.   Hydradenitis    Hypertension    Hyperthyroidism    Infection    UTI   MI (myocardial infarction) ([place]) 2017   Neuropathy    Syncope 02/2020   encouraged to hydrate and have snacks nearby. has passed out. happens several x a week.   Tachycardia    Thyroid disease    Patient Active Problem List   Diagnosis Date Noted   Postpartum anemia 03/27/2020   Keloid scar of skin 03/25/2020   S/P cesarean section 03/25/2020   Chronic hypertension in pregnancy 12/17/2019   UTI (urinary tract infection) during pregnancy 09/14/2019   Back pain affecting pregnancy in third trimester 09/14/2019   Low TSH level 09/09/2019   Chest pain 08/30/2019   History of  pre-eclampsia 05/01/2019   Supervision of high risk pregnancy, antepartum 01/16/2019   Diabetes mellitus complicating pregnancy [id number]   Atherosclerosis of native coronary artery with stable angina pectoris ([place]) 01/09/2019   Tachycardia 01/09/2019   Mixed hyperlipidemia 01/09/2019   Hyperthyroidism affecting pregnancy in second trimester 01/07/2019   Lead exposure 12/06/2018   Smoker 12/06/2018   Bipolar 2 disorder ([place]) 12/06/2018   History of anxiety 12/06/2018   Mild intermittent asthma without complication [id number]   Type 2 diabetes mellitus without complication, without long-term current use of insulin ([facility]) 12/06/2018   High-risk pregnancy in second trimester 12/06/2018   History of heart attack 12/06/2018   Graves disease 10/30/2018   History of cesarean section 10/30/2018   Nausea/vomiting in pregnancy 10/30/2018   History of marijuana use 10/30/2018   HYPERCHOLESTEROLEMIA 12/08/2009   HIDRADENITIS SUPPURATIVA 12/08/2009   VITAMIN D DEFICIENCY 11/18/2009   GERD 11/18/2009   MENORRHAGIA 11/18/2009   Diabetes mellitus type 2 with complications ([place]) [id number]   Essential hypertension 11/17/2009   ASTHMA 11/17/2009   Home Medication(s) Prior to Admission medications   Medication Sig Start Date End Date Taking? Authorizing Provider  ondansetron (ZOFRAN-ODT) 4 MG disintegrating tablet Take 1 tablet (4 mg total) by mouth every 8 (eight) hours as needed for up to 3 days for nausea or vomiting. 10/05/22 10/08/22 Yes Deem Marmol, Grayce Sessions, MD  sucralfate (CARAFATE) 1 GM/10ML suspension Take 10 mLs (1 g total) by mouth 3 (three) times daily with meals. 10/05/22  Yes Nayomi Tabron, Grayce Sessions, MD  clindamycin (CLEOCIN) 150 MG capsule Take 1 capsule (150 mg total) by mouth every 6 (six) hours. 05/17/22   Maudie Flakes, MD  dicyclomine (BENTYL) 20 MG tablet Take 1 tablet (20 mg total) by mouth 3 (three) times daily as needed for spasms. 07/04/22   Long, Wonda Olds, MD  ibuprofen (ADVIL)  800 MG tablet Take 1 tablet (800 mg total) by mouth 3 (three) times daily. 07/01/22   Montine Circle, PA-C  ondansetron (ZOFRAN) 4 MG tablet Take 1 tablet (4 mg total) by mouth every 6 (six) hours. 04/27/22   Marcello Fennel, PA-C  pantoprazole (PROTONIX) 40 MG tablet Take 1 tablet (40 mg total) by mouth daily. 07/04/22 08/03/22  LongWonda Olds, MD                                                                                                                                    Allergies Darvocet [propoxyphene n-acetaminophen], Peanut-containing drug products, Penicillins, Cephalexin, Toradol [ketorolac tromethamine], and Tramadol  Review of Systems Review of Systems  Constitutional:  Negative for chills, fatigue and fever.  Respiratory:  Negative for cough and shortness of breath.   Gastrointestinal:  Positive for abdominal pain, nausea and vomiting. Negative for flatus.  Genitourinary:  Negative for dysuria.   As noted in HPI  Physical Exam Vital Signs  I have reviewed the triage vital signs BP (!) 149/96 (BP Location: Left Arm)   Pulse 71   Temp 98 F (36.7 C) (Oral)   Resp 16   Ht 5\' 6"  (1.676 m)   Wt 90.7 kg   SpO2 99%   BMI 32.28 kg/m   Physical Exam Vitals reviewed.  Constitutional:      General: She is not in acute distress.    Appearance: She is well-developed. She is obese. She is not diaphoretic.  HENT:     Head: Normocephalic and atraumatic.     Right Ear: External ear normal.     Left Ear: External ear normal.     Nose: Nose normal.  Eyes:     General: No scleral icterus.    Conjunctiva/sclera: Conjunctivae normal.  Neck:     Trachea: Phonation normal.  Cardiovascular:     Rate and Rhythm: Normal rate and regular rhythm.  Pulmonary:     Effort: Pulmonary effort is normal. No respiratory distress.     Breath sounds: No stridor.  Abdominal:     General: There is no distension.     Tenderness: There is no abdominal tenderness. There is no guarding or  rebound. Negative signs include Murphy's sign.  Musculoskeletal:        General: Normal range of motion.     Cervical back: Normal range of motion.  Neurological:     Mental  Status: She is alert and oriented to person, place, and time.  Psychiatric:        Behavior: Behavior normal.     ED Results and Treatments Labs (all labs ordered are listed, but only abnormal results are displayed) Labs Reviewed  COMPREHENSIVE METABOLIC PANEL - Abnormal; Notable for the following components:      Result Value   Glucose, Bld 104 (*)    Total Bilirubin 0.2 (*)    All other components within normal limits  URINALYSIS, ROUTINE W REFLEX MICROSCOPIC - Abnormal; Notable for the following components:   Leukocytes,Ua SMALL (*)    Bacteria, UA FEW (*)    All other components within normal limits  LIPASE, BLOOD  CBC  I-STAT BETA HCG BLOOD, ED ([facility], [facility], [facility] ONLY)                                                                                                                         EKG  EKG Interpretation  Date/Time:    Ventricular Rate:    PR Interval:    QRS Duration:   QT Interval:    QTC Calculation:   R Axis:     Text Interpretation:         Radiology US Abdomen Limited RUQ (LIVER/GB)  Result Date: 10/04/2022 CLINICAL DATA:  Abdominal pain EXAM: ULTRASOUND ABDOMEN LIMITED RIGHT UPPER QUADRANT COMPARISON:  CT chest July 04, 2022 FINDINGS: Gallbladder: Cholelithiasis. Largest stone measures 5 mm. Patient is medicated, therefore unable to assess for Murphy's sign. No gallbladder wall thickening or pericholecystic fluid. Common bile duct: Diameter: 4 mm Liver: Increased echogenicity. No focal lesion. Portal vein is patent on color Doppler imaging with normal direction of blood flow towards the liver. Other: None. IMPRESSION: 1. Cholelithiasis without secondary signs of acute cholecystitis. 2. Increased hepatic parenchymal echogenicity suggestive of steatosis. Electronically Signed   By: Lovey Newcomer M.D.   On: 10/04/2022 16:45    Medications Ordered in ED Medications  acetaminophen (TYLENOL) tablet 1,000 mg (1,000 mg Oral Not Given 10/05/22 0028)  ondansetron (ZOFRAN-ODT) disintegrating tablet 4 mg (4 mg Oral Given 10/04/22 1537)  oxyCODONE-acetaminophen (PERCOCET/ROXICET) 5-325 MG per tablet 1 tablet (1 tablet Oral Given 10/04/22 1536)  ondansetron (ZOFRAN-ODT) disintegrating tablet 4 mg (4 mg Oral Given 10/05/22 0359)  sucralfate (CARAFATE) tablet 1 g (1 g Oral Given 10/05/22 0414)  Procedures Procedures  (including critical care time)  Medical Decision Making / ED Course   Medical Decision Making Amount and/or Complexity of Data Reviewed Labs: ordered. Decision-making details documented in ED Course. Radiology: ordered and independent interpretation performed. Decision-making details documented in ED Course.  Risk OTC drugs. Prescription drug management.    Right upper quadrant abdominal pain. Abdominal exam reassuring Known history of gallstones Will assess for choledocholithiasis.  Also considering gastritis, duodenitis, peptic ulcer disease. Given nausea and vomiting, will assess for electrolyte derangements.  History of diabetes we will assess for hyperglycemia/DKA.  We will also assess for any pregnancy related process though less suspicion of this.  CBC without leukocytosis or anemia. CMP without significant electrolyte derangements or renal sufficiency. Mild hyperglycemia without evidence of DKA. No evidence of bili obstruction or pancreatitis. hCG negative.  Right upper quadrant ultrasound notable for cholelithiasis without evidence of choledocholithiasis or acute cholecystitis.  Patient provided with oral pain medicine.  Able to tolerate p.o.  Patient does not require admission for continued work-up and management.  Will refer  patient to general surgery for evaluation of outpatient surgical management for cholelithiasis.       Final Clinical Impression(s) / ED Diagnoses Final diagnoses:  Calculus of gallbladder without cholecystitis without obstruction  Nausea and vomiting in adult   The patient appears reasonably screened and/or stabilized for discharge and I doubt any other medical condition or other [facility] requiring further screening, evaluation, or treatment in the ED at this time. I have discussed the findings, Dx and Tx plan with the patient/family who expressed understanding and agree(s) with the plan. Discharge instructions discussed at length. The patient/family was given strict return precautions who verbalized understanding of the instructions. No further questions at time of discharge.  Disposition: Discharge  Condition: Good  ED Discharge Orders          Ordered    ondansetron (ZOFRAN-ODT) 4 MG disintegrating tablet  Every 8 hours PRN        10/05/22 0408    sucralfate (CARAFATE) 1 g tablet  3 times daily,   Status:  Discontinued        10/05/22 0408    sucralfate (CARAFATE) 1 GM/10ML suspension  3 times daily with meals        10/05/22 0451              Follow Up: Dwan Bolt, MD [place]. [address] [place] [address] [phone]  Call  to schedule an appointment for close follow up  Primary care provider  Call  to schedule an appointment for close follow up           This chart was dictated using voice recognition software.  Despite best efforts to proofread,  errors can occur which can change the documentation meaning.      Fatima Blank, MD 10/05/22 [phone]

## 2022-10-05 NOTE — ED Notes (Signed)
RN was made aware that pt's pain had returned, notified provider who ordered Tylenol for her.  She was brought back to triage however she was very unhappy w/ the medication and refused.  She left triage yelling and cussing at staff.

## 2022-10-11 ENCOUNTER — Telehealth: Payer: Self-pay | Admitting: Cardiovascular Disease

## 2022-10-11 NOTE — Telephone Encounter (Signed)
   Pre-operative Risk Assessment    Patient Name: Michelle Osborn  DOB: [birth year] MRN: [id number]      Request for Surgical Clearance    Procedure:   CHOLECYSTECTOMY  Date of Surgery:  Clearance TBD                               Surgeon:  DR Eddie Dibbles STECHSCHULTE Surgeon's Group or Practice Name:  [facility] Phone number:  [phone] Fax number:  [phone]  Type of Clearance Requested:   - Medical   Type of Anesthesia:  General    Additional requests/questions:    SignedEli Phillips   10/11/2022, 2:41 PM

## 2022-10-11 NOTE — Telephone Encounter (Signed)
Pt has been scheduled to see Ignacia Bayley, NP 10/16/22 @ 11:20 at the [facility] location. Pt thanked me for the help.

## 2022-10-11 NOTE — Telephone Encounter (Signed)
   Name: Michelle Osborn  DOB: 1986-05-14  MRN: [id number]  Primary Cardiologist: Ida Rogue, MD  Chart reviewed as part of pre-operative protocol coverage. Because of [name]'s past medical history and time since last visit, she will require a follow-up in-office visit in order to better assess preoperative cardiovascular risk.  Pre-op covering staff: - Please schedule appointment and call patient to inform them. If patient already had an upcoming appointment within acceptable timeframe, please add "pre-op clearance" to the appointment notes so provider is aware. - Please contact requesting surgeon's office via preferred method (i.e, phone, fax) to inform them of need for appointment prior to surgery.  No medications indicated as needing held.   Elgie Collard, PA-C  10/11/2022, 4:55 PM

## 2022-10-15 ENCOUNTER — Encounter: Payer: Self-pay | Admitting: Obstetrics and Gynecology

## 2022-10-16 ENCOUNTER — Encounter: Payer: Self-pay | Admitting: Obstetrics and Gynecology

## 2022-10-16 ENCOUNTER — Ambulatory Visit: Payer: Medicaid Other | Attending: Nurse Practitioner | Admitting: Nurse Practitioner

## 2022-10-16 ENCOUNTER — Ambulatory Visit (INDEPENDENT_AMBULATORY_CARE_PROVIDER_SITE_OTHER): Payer: Medicaid Other

## 2022-10-16 ENCOUNTER — Encounter: Payer: Self-pay | Admitting: Nurse Practitioner

## 2022-10-16 VITALS — BP 136/88 | HR 71 | Ht 67.0 in | Wt 209.4 lb

## 2022-10-16 DIAGNOSIS — R002 Palpitations: Secondary | ICD-10-CM

## 2022-10-16 DIAGNOSIS — R55 Syncope and collapse: Secondary | ICD-10-CM

## 2022-10-16 DIAGNOSIS — R0609 Other forms of dyspnea: Secondary | ICD-10-CM

## 2022-10-16 DIAGNOSIS — I2510 Atherosclerotic heart disease of native coronary artery without angina pectoris: Secondary | ICD-10-CM | POA: Diagnosis not present

## 2022-10-16 DIAGNOSIS — E785 Hyperlipidemia, unspecified: Secondary | ICD-10-CM

## 2022-10-16 DIAGNOSIS — Z720 Tobacco use: Secondary | ICD-10-CM | POA: Diagnosis not present

## 2022-10-16 DIAGNOSIS — I10 Essential (primary) hypertension: Secondary | ICD-10-CM

## 2022-10-16 MED ORDER — ASPIRIN 81 MG PO TBEC: 81.0000 mg | DELAYED_RELEASE_TABLET | Freq: Every day | ORAL | 3 refills | Status: AC

## 2022-10-16 NOTE — Patient Instructions (Signed)
Medication Instructions:  Your physician has recommended you make the following change in your medication:   START Aspirin 81 mg once daily   *If you need a refill on your cardiac medications before your next appointment, please call your pharmacy*   Lab Work: None  If you have labs (blood work) drawn today and your tests are completely normal, you will receive your results only by: MyChart Message (if you have MyChart) OR A paper copy in the mail If you have any lab test that is abnormal or we need to change your treatment, we will call you to review the results.   Testing/Procedures: Your physician has requested that you have an echocardiogram. Echocardiography is a painless test that uses sound waves to create images of your heart. It provides your doctor with information about the size and shape of your heart and how well your heart's chambers and valves are working. This procedure takes approximately one hour. There are no restrictions for this procedure. Please do NOT wear cologne, perfume, aftershave, or lotions (deodorant is allowed). Please arrive 15 minutes prior to your appointment time.    Your physician has recommended that you wear a Zio AT Live monitor for 14 days.   This monitor is a medical device that records the heart's electrical activity. Doctors most often use these monitors to diagnose arrhythmias. Arrhythmias are problems with the speed or rhythm of the heartbeat. The monitor is a small device applied to your chest. You can wear one while you do your normal daily activities. While wearing this monitor if you have any symptoms to push the button and record what you felt. Once you have worn this monitor for the period of time provider prescribed (Usually 14 days), you will return the monitor device in the postage paid box/bag. Once it is returned they will download the data collected and provide [place] with a report which the provider will then review and we will call you  with those results.   Important tips:  Avoid showering during the first 24 hours of wearing the monitor. Avoid excessive sweating to help maximize wear time. Do not submerge the device, no hot tubs, and no swimming pools. Keep any lotions or oils away from the patch. After 24 hours you may shower with the patch on. Take brief showers with your back facing the shower head.  Do not remove patch once it has been placed because that will interrupt data and decrease adhesive wear time. Push the button when you have any symptoms and write down what you were feeling. Once you have completed wearing your monitor, remove and place into box which has postage paid and place in your outgoing mailbox.  If for some reason you have misplaced your box then call our office and we can provide another box and/or mail it off for you. Keep the transmitter within 10 feet at all times.  Expect a welcome phone call within 24-48 hrs of application from Zio.  This call will include your copay information, so please answer any unknown phone calls while wearing Zio (it could also be important information about your heart) The envelope to return Zio is in the back of the transmitter. Removal instructions are on the last page of the symptom diary.  Place the patch sticky side up inside the transmitter and the symptom diary inside the envelope to return on your last wear day inside your mailbox or any [employer]PS mailbox.    Follow-Up: At [facility], you  and your health needs are our priority.  As part of our continuing mission to provide you with exceptional heart care, we have created designated Provider Care Teams.  These Care Teams include your primary Cardiologist (physician) and Advanced Practice Providers (APPs -  Physician Assistants and Nurse Practitioners) who all work together to provide you with the care you need, when you need it.   Your next appointment:   1 month(s)  The format for your next  appointment:   In Person  Provider:   Ida Rogue, MD or Murray Hodgkins, NP       Important Information About Sugar

## 2022-10-16 NOTE — Progress Notes (Signed)
Office Visit    Patient Name: Michelle Osborn Date of Encounter: 10/16/2022  Primary Care Provider:  Pcp, No Primary Cardiologist:  Julien Nordmann, MD  Chief Complaint    36 year old female with a history of CAD, hypertension, hyperlipidemia, diabetes, tobacco abuse, PCOS, obesity, Graves' disease, and anxiety, who presents for follow-up related to CAD and preoperative cardiovascular examination pending cholecystectomy.  Past Medical History    Past Medical History:  Diagnosis Date   Anemia    Anxiety    Asthma    "grew out' not since teenager   Bipolar 1 disorder (HCC)    CAD (coronary artery disease)    a. 03/2016 NSTEMI/PCI: DES to LAD and D1.   Depression    Diabetes mellitus    not currently on meds, normal A1c   Discoloration of skin of foot 02/2020   pt wakes up with purple feet every am. it dissipates as day goes on.   Graves disease    History of echocardiogram    a. 08/2019 Echo: EF 60-65%, no signif valvular dzs.   Hydradenitis    Hyperlipidemia LDL goal <70    Hypertension    Hyperthyroidism    MI (myocardial infarction) (HCC) 2017   Neuropathy    Syncope 02/2020   encouraged to hydrate and have snacks nearby. has passed out. happens several x a week.   Tachycardia    Past Surgical History:  Procedure Laterality Date   ADENOIDECTOMY     APPENDECTOMY     CARDIAC CATHETERIZATION  03/2016   [place], [place]    CESAREAN SECTION     CESAREAN SECTION N/A 05/05/2019   Procedure: REPEAT CESAREAN SECTION;  Surgeon: Linzie Collin, MD;  Location: [facility] ORS;  Service: Obstetrics;  Laterality: N/A;   CESAREAN SECTION WITH BILATERAL TUBAL LIGATION Bilateral 03/25/2020   Procedure: CESAREAN SECTION WITH BILATERAL TUBAL LIGATION;  Surgeon: Hildred Laser, MD;  Location: [facility] ORS;  Service: Obstetrics;  Laterality: Bilateral;  Repeat   CORONARY ANGIOPLASTY WITH STENT PLACEMENT  03/2016   2 stents   DILATION AND CURETTAGE OF UTERUS  2014   EXCISION OF HYDRADENITIS   2009, 2012   REMOVAL OF SWEAT GLANDS   OTHER SURGICAL HISTORY     sweat gland excision   TONSILLECTOMY      Allergies  Allergies  Allergen Reactions   Darvocet [Propoxyphene N-Acetaminophen] Anaphylaxis    Also makes her stomach hurt   Peanut-Containing Drug Products Shortness Of Breath, Swelling and Itching   Penicillins Shortness Of Breath, Swelling and Anaphylaxis    Did it involve swelling of the face/tongue/throat, SOB, or low BP? Yes Did it involve sudden or severe rash/hives, skin peeling, or any reaction on the inside of your mouth or nose? No Did you need to seek medical attention at a hospital or doctor's office? Yes When did it last happen?      2007 If all above answers are "NO", may proceed with cephalosporin use.  Did it involve swelling of the face/tongue/throat, SOB, or low BP? Yes Did it involve sudden or severe rash/hives, skin peeling, or any reaction on the inside of your mouth or nose? No Did you need to seek medical attention at a hospital or doctor's office? Yes When did it last happen?      2007 If all above answers are "NO", may proceed with cephalosporin use.  throat   Tomato Itching   Cephalexin Hives and Itching   Sulfa Antibiotics Other (See Comments)   Sumatriptan  Nausea And Vomiting   Toradol [Ketorolac Tromethamine] Rash    Red rash with bumps   Tramadol Nausea And Vomiting    History of Present Illness    36 year old female with the above past medical history including CAD, hypertension, hyperlipidemia, diabetes, tobacco abuse, PCOS, obesity, Graves' disease, and anxiety.  In April 2017, she suffered a non-STEMI and was treated at [facility] with drug-eluting stent to the LAD and first diagonal.  She has since been followed by Dr. Mariah Milling.  A 2D echocardiogram in August 2020 showed an EF of 60 to 65% without any significant valvular disease.  In late 2020, she reported episodic syncope and a ZIO monitor was ordered.  This never resulted as it  was apparently lost in the mail.  Luci Bank was reordered in January 2021 however, no results are available and patient has not followed up since then.  Ms. Lesesne has had multiple ER visits over the past year and was most recently seen on October 6 with complaints of abdominal discomfort.  Right upper quadrant ultrasound was notable for cholelithiasis without evidence of choledocholithiasis or acute cholecystitis.  She was discharged home and follow-up with general surgery on October 11 with recommendation for laparoscopic cholecystectomy.  Patient presents today for preoperative cardiovascular examination.  Since her last telephonic visit in January 2021, she has not been experiencing any significant chest pain though she occasionally has a sharp, focal, fleeting pain in her left chest.  She does have dyspnea on exertion with higher levels of activity and inclines.  She can walk up a flight of stairs as well as do routine chores around the house, yard, and can take care of grocery shopping without significant issues as long as she maintains a slow and steady pace.  She also reports intermittent palpitations described as an empty feeling in her chest and unrelated ongoing syncope that occurs 2-3 times a month, both at rest and while standing.  Syncopal episodes initially started as a sensation of warmth starting in her legs and moving up into her chest and head.  She becomes lightheaded and mildly diaphoretic.  Sometimes she has nausea.  She is often now able to recognize symptoms and will either lie or sit down to prevent a syncopal spell but notes that if symptoms come on too quickly and she is not in a position to get down, she will often walk into something to try and "snap out of it."  She has not sustained any significant trauma and does not drive.  Though she was ordered a monitor in January 2021, she notes today that she never wore it.  She is willing to try again.  She denies PND, orthopnea, edema, or early  satiety.  She continues to smoke about 5 cigarettes a day.  Home Medications    Current Outpatient Medications  Medication Sig Dispense Refill   aspirin EC 81 MG tablet Take 1 tablet (81 mg total) by mouth daily. Swallow whole. 90 tablet 3   escitalopram (LEXAPRO) 5 MG tablet Take 5 mg by mouth daily.     linaclotide (LINZESS) 72 MCG capsule Take 72 mcg by mouth daily.     pantoprazole (PROTONIX) 40 MG tablet Take 40 mg by mouth daily.     QUEtiapine (SEROQUEL) 100 MG tablet Take 300 mg by mouth at bedtime.     rosuvastatin (CRESTOR) 10 MG tablet Take 10 mg by mouth at bedtime.     sucralfate (CARAFATE) 1 GM/10ML suspension Take 10 mLs (1 g  total) by mouth 3 (three) times daily with meals. 420 mL 0   clindamycin (CLEOCIN) 150 MG capsule Take 1 capsule (150 mg total) by mouth every 6 (six) hours. 28 capsule 0   dicyclomine (BENTYL) 20 MG tablet Take 1 tablet (20 mg total) by mouth 3 (three) times daily as needed for spasms. 20 tablet 0   ibuprofen (ADVIL) 800 MG tablet Take 1 tablet (800 mg total) by mouth 3 (three) times daily. 21 tablet 0   ondansetron (ZOFRAN) 4 MG tablet Take 1 tablet (4 mg total) by mouth every 6 (six) hours. 12 tablet 0   pantoprazole (PROTONIX) 40 MG tablet Take 1 tablet (40 mg total) by mouth daily. 30 tablet 0   No current facility-administered medications for this visit.     Review of Systems    Ongoing dyspnea on exertion and syncope.  Occasional palpitations and sharp fleeting/focal left chest pain.  She denies PND, orthopnea, edema, or early satiety.  All other systems reviewed and are otherwise negative except as noted above.    Physical Exam    VS:  BP 136/88 (BP Location: Left Arm, Patient Position: Sitting, Cuff Size: Large)   Pulse 71   Ht 5\' 7"  (1.702 m)   Wt 209 lb 6.4 oz (95 kg)   SpO2 99%   BMI 32.80 kg/m  , BMI Body mass index is 32.8 kg/m.     GEN: Well nourished, well developed, in no acute distress. HEENT: normal. Neck: Supple, no JVD,  carotid bruits, or masses. Cardiac: RRR, no murmurs, rubs, or gallops. No clubbing, cyanosis, edema.  Radials/PT 2+ and equal bilaterally.  Respiratory:  Respirations regular and unlabored, clear to auscultation bilaterally. GI: Soft, nontender, nondistended, BS + x 4. MS: no deformity or atrophy. Skin: warm and dry, no rash. Neuro:  Strength and sensation are intact. Psych: Normal affect.  Accessory Clinical Findings    ECG personally reviewed by me today -regular sinus rhythm, 71- no acute changes.  Lab Results  Component Value Date   WBC 9.2 10/04/2022   HGB 13.1 10/04/2022   HCT 39.5 10/04/2022   MCV 84.2 10/04/2022   PLT 325 10/04/2022   Lab Results  Component Value Date   CREATININE 0.75 10/04/2022   BUN 10 10/04/2022   NA 141 10/04/2022   K 4.3 10/04/2022   CL 110 10/04/2022   CO2 23 10/04/2022   Lab Results  Component Value Date   ALT 16 10/04/2022   AST 17 10/04/2022   ALKPHOS 124 10/04/2022   BILITOT 0.2 (L) 10/04/2022   Lab Results  Component Value Date   CHOL 176 11/18/2009   HDL 37 (L) 11/18/2009   LDLCALC 127 (H) 11/18/2009   TRIG 62 11/18/2009   CHOLHDL 4.8 Ratio 11/18/2009    Lab Results  Component Value Date   HGBA1C 4.8 12/17/2019    Assessment & Plan    1.  Coronary artery disease/preoperative cardiovascular examination: Patient with a history of CAD status post non-STEMI in April 2017 with drug-eluting stent placement to the LAD and first diagonal.  She has not been seen in cardiology clinic in over 2 years.  She occasionally notes fleeting sharp and focal chest pain which is not consistent with prior angina but also notes a long history of dyspnea on exertion.  She can do most things around her home without limitations but any excess and activity results in significant dyspnea which causes her to stop or slow down.  She was recently diagnosed  with cholelithiasis and has been experiencing abdominal discomfort.  She is pending laparoscopic  cholecystectomy.  In light of symptoms of dyspnea and also syncope (see below), I am going to arrange for an echo and ZIO monitor.  Abnormalities in either may require further ischemic testing prior to surgery.  Though she is currently taking a statin, she is not on aspirin.  She had been on this but says she just has not been good about taking it.  She will start taking aspirin 81 mg daily.  2.  Syncope: Patient with a long history of syncope which is relatively sudden in onset occurring at either rest or while standing/with activity about 2-3 times per month.  Symptoms started as a warm and flushing sensation in her lower extremities that moves to her chest and head.  Symptoms are often associated with diaphoresis, nausea and then lightheadedness.  She can often thwart a syncopal spell by lying down.  On the surface, symptoms sound vasovagal in nature.  Though she has been ordered monitors before, we do not have any documentation/results and thus are somewhat in the dark with regards to potential for arrhythmia causing symptoms.  She was agreeable to wear a ZIO AT over the next 2 weeks and we will mail this out to her.    3.  Essential hypertension: Stable.  She is not currently on any antihypertensives.  4.  Hyperlipidemia: She says her lipids are followed by her primary care provider.  She has been prescribed rosuvastatin and reports compliance.  5.  Cholelithiasis: See #1.  Pending laparoscopic cholecystectomy following completion of cardiovascular examination.  Provided that echo and monitoring look okay, it does not appear that she would require additional ischemic testing.  Further recommendations following those test results.  6.  Tobacco abuse: She has been smoking as much as 5 cigarettes a day but that she has not smoked since this past Sunday.  She is interested in quitting.  I recommended that she consider nicotine patch and also Wellbutrin versus Chantix.  She will consider the patch and  discuss the latter options with her primary care provider.  7.  Dyspnea on exertion: See #1.  Patient with chronic dyspnea on exertion which she notes may have progressed some.  As above, she does not experience angina.  Follow-up echo.  Lab work in the ER recently was unremarkable.  8.  Disposition: Follow-up echo and ZIO monitor.  Follow-up in clinic in 1 month or sooner if necessary.   Murray Hodgkins, NP 10/16/2022, 12:30 PM

## 2022-10-19 ENCOUNTER — Other Ambulatory Visit (HOSPITAL_COMMUNITY): Payer: Medicaid Other

## 2022-10-22 ENCOUNTER — Ambulatory Visit: Payer: Medicaid Other | Admitting: Surgery

## 2022-10-22 ENCOUNTER — Telehealth: Payer: Self-pay | Admitting: Cardiovascular Disease

## 2022-10-22 DIAGNOSIS — R55 Syncope and collapse: Secondary | ICD-10-CM

## 2022-10-22 NOTE — Telephone Encounter (Signed)
Calling to make office aware that pt's Heart Monitor was delivered on 10/19 and has yet to be activated. Please advise 

## 2022-10-23 ENCOUNTER — Telehealth: Payer: Self-pay | Admitting: Cardiovascular Disease

## 2022-10-23 NOTE — Telephone Encounter (Signed)
Call to patient who states when she awoke at 5AM this morning she noted edema all over, intermittent slurred speech, dizziness, and pain behind her left eye.  Pt is a member with [employer] whom she contacted earlier today.  [place] sent a paramedic out to see the patient.  Patient states the provider from [facility] told her she was dehydrated, when she stood up her "vitals shot up."  Writer called [employer],  spoke with provider NP? PA? named Brit who stated when the paramedic was in the patient's home the pt didn't mention pain behind her eye, or slurred speech.  Speech was not slurred during writer's interaction with the patient. Patient states speech slurring is intermittent. Lawson Radar stated there was no change in orthostatic Bp from lying to standing however her pulse rate increased to 102.  Pt was encouraged to stay hydrated. Patient was instructed to notify cardiology since "we are managing her care."  Pt states she applied the monitor on 10/22/2022 as ordered on 10/16/2022 with cardiology. Per provider at [facility] pt is stable and does not need to go to the ED at this time, but feels she should be seen with cardiology within 1 to 2 days. Attempted to call patient back as promised, received voicemail, message left. Appt scheduled for 10/25/2022 1:55 PM will notify patient via My Chart. Callao RN CCM.

## 2022-10-23 NOTE — Telephone Encounter (Signed)
Brooke Cables called from [facility],  that patient called with Ortho statics BP changes.  Have generalized weakness since 5am this morning.  She feels patient needs to be seen by [place].  If we can give patient a call.

## 2022-10-24 NOTE — Telephone Encounter (Signed)
Thank you for update.  Her symptoms of slurred speech, dizziness, and headache, are not likely to be consistent with a cardiac etiology.  She should see PCP or consider urgent care/ED eval for ongoing symptoms.

## 2022-10-25 ENCOUNTER — Ambulatory Visit: Payer: Medicaid Other | Attending: Nurse Practitioner | Admitting: Nurse Practitioner

## 2022-10-25 ENCOUNTER — Encounter: Payer: Self-pay | Admitting: Nurse Practitioner

## 2022-10-25 VITALS — BP 138/88 | HR 83 | Ht 67.0 in | Wt 217.0 lb

## 2022-10-25 DIAGNOSIS — E785 Hyperlipidemia, unspecified: Secondary | ICD-10-CM | POA: Diagnosis not present

## 2022-10-25 DIAGNOSIS — I2510 Atherosclerotic heart disease of native coronary artery without angina pectoris: Secondary | ICD-10-CM

## 2022-10-25 DIAGNOSIS — I10 Essential (primary) hypertension: Secondary | ICD-10-CM

## 2022-10-25 DIAGNOSIS — R55 Syncope and collapse: Secondary | ICD-10-CM

## 2022-10-25 NOTE — Progress Notes (Signed)
Office Visit    Patient Name: Michelle Osborn Date of Encounter: 10/25/2022  Primary Care Provider:  Pcp, Osborn Primary Cardiologist:  Michelle Nordmann, MD  Chief Complaint    36 year old female with history of CAD, hypertension, hyperlipidemia, diabetes, tobacco abuse, PCOS, obesity, Graves' disease, anxiety, presents for follow-up related to swelling.  Past Medical History    Past Medical History:  Diagnosis Date   Anemia    Anxiety    Asthma    "grew out' not since teenager   Bipolar 1 disorder (HCC)    CAD (coronary artery disease)    a. 03/2016 NSTEMI/PCI: DES to LAD and D1.   Depression    Diabetes mellitus    not currently on meds, normal A1c   Discoloration of skin of foot 02/2020   pt wakes up with purple feet every am. it dissipates as day goes on.   Graves disease    History of echocardiogram    a. 08/2019 Echo: EF 60-65%, Osborn signif valvular dzs.   Hydradenitis    Hyperlipidemia LDL goal <70    Hypertension    Hyperthyroidism    MI (myocardial infarction) (HCC) 2017   Neuropathy    Syncope 02/2020   encouraged to hydrate and have snacks nearby. has passed out. happens several x a week.   Tachycardia    Past Surgical History:  Procedure Laterality Date   ADENOIDECTOMY     APPENDECTOMY     CARDIAC CATHETERIZATION  03/2016   [place], [place]    CESAREAN SECTION     CESAREAN SECTION N/A 05/05/2019   Procedure: REPEAT CESAREAN SECTION;  Surgeon: Michelle Collin, MD;  Location: [facility] ORS;  Service: Obstetrics;  Laterality: N/A;   CESAREAN SECTION WITH BILATERAL TUBAL LIGATION Bilateral 03/25/2020   Procedure: CESAREAN SECTION WITH BILATERAL TUBAL LIGATION;  Surgeon: Michelle Laser, MD;  Location: [facility] ORS;  Service: Obstetrics;  Laterality: Bilateral;  Repeat   CORONARY ANGIOPLASTY WITH STENT PLACEMENT  03/2016   2 stents   DILATION AND CURETTAGE OF UTERUS  2014   EXCISION OF HYDRADENITIS  2009, 2012   REMOVAL OF SWEAT GLANDS   OTHER SURGICAL HISTORY     sweat  gland excision   TONSILLECTOMY      Allergies  Allergies  Allergen Reactions   Darvocet [Propoxyphene N-Acetaminophen] Anaphylaxis    Also makes her stomach hurt   Peanut-Containing Drug Products Shortness Of Breath, Swelling and Itching   Penicillins Shortness Of Breath, Swelling and Anaphylaxis    Did it involve swelling of the face/tongue/throat, SOB, or low BP? Yes Did it involve sudden or severe rash/hives, skin peeling, or any reaction on the inside of your mouth or nose? Osborn Did you need to seek medical attention at a hospital or doctor's office? Yes When did it last happen?      2007 If all above answers are "Osborn", may proceed with cephalosporin use.  Did it involve swelling of the face/tongue/throat, SOB, or low BP? Yes Did it involve sudden or severe rash/hives, skin peeling, or any reaction on the inside of your mouth or nose? Osborn Did you need to seek medical attention at a hospital or doctor's office? Yes When did it last happen?      2007 If all above answers are "Osborn", may proceed with cephalosporin use.  throat   Tomato Itching   Cephalexin Hives and Itching   Sulfa Antibiotics Other (See Comments)   Sumatriptan Nausea And Vomiting   Toradol [Ketorolac Tromethamine] Rash  Red rash with bumps   Tramadol Nausea And Vomiting    History of Present Illness    36 year old female with above past medical history including CAD, hypertension, hyperlipidemia, diabetes, tobacco abuse, PCOS, obesity, Graves' disease, and anxiety.  In April 2017, she suffered a non-STEMI and was treated at [facility] with drug-eluting stent to the LAD and first diagonal.  She has since been followed by Dr. Mariah Osborn.  2D echocardiogram in August 2020, showed an EF of 60 to 65% without any significant valvular disease.  In late 2020, she reported episodic syncope and a ZIO monitor was ordered.  This never resulted as it was apparently lost in the mail.  Michelle Osborn was reordered in January 2021 however, Osborn  results are available and patient says she never wore.  [name] has had multiple ER visits over the past year and was most recently seen on October 6 with complaints of abdominal discomfort.  Right upper quadrant ultrasound was notable for cholelithiasis without evidence of choledocholithiasis or acute cholecystitis.  She subsequently followed up with general surgery with recommendation for laparoscopic cholecystectomy.  I saw her in clinic October 17 for preoperative evaluation, and she reported significant dyspnea on exertion with limited activity as well as intermittent palpitations and syncopal episodes occurring 2-3 times per month both at rest and while standing typically preceded by vasovagal type symptoms (warmth, nausea, diaphoresis).  A Zio monitor was placed and echocardiogram ordered.  On October 24, patient contacted office to report that she awoke and felt that her hands and face were swollen and also had generalized numbness and weakness.  She is a member of [employer] and contacted the nurse on call.  EMS was sent to the house and patient that she needed assistance getting out of bed.  She felt very weak and lightheaded.  Phone note indicates that she was complaining of headache and slurring of her speech Osborn she does not report that today.  She was apparently tachycardic and her blood pressure was variable per her report.  She was told she was dehydrated and needed IV fluids but she refused IV fluids and preferred to drink fluid throughout the remainder of the day.  She has had Osborn recurrent symptoms.  She was wearing her monitor at the time of this event, and Osborn significant arrhythmias were recorded based on review of available rhythms and Zio suite.  She has had some intermittent lightheadedness and brief palpitations.  She denies chest pain.  She has chronic, stable dyspnea.  She denies PND, orthopnea, syncope, edema, or early satiety.  Home Medications    Current Outpatient  Medications  Medication Sig Dispense Refill   aspirin EC 81 MG tablet Take 1 tablet (81 mg total) by mouth daily. Swallow whole. 90 tablet 3   escitalopram (LEXAPRO) 5 MG tablet Take 5 mg by mouth daily.     linaclotide (LINZESS) 72 MCG capsule Take 72 mcg by mouth daily.     pantoprazole (PROTONIX) 40 MG tablet Take 40 mg by mouth daily.     QUEtiapine (SEROQUEL) 100 MG tablet Take 300 mg by mouth at bedtime.     rosuvastatin (CRESTOR) 10 MG tablet Take 10 mg by mouth at bedtime.     sucralfate (CARAFATE) 1 GM/10ML suspension Take 10 mLs (1 g total) by mouth 3 (three) times daily with meals. 420 mL 0   Osborn current facility-administered medications for this visit.     Review of Systems    Generalized malaise,  weakness, and numbness with swelling of her hands and face on October 24-symptoms resolved.  Still with dyspnea on exertion and occasional palpitations.  She denies chest pain, PND, orthopnea, syncope, lower extremity edema, or early satiety.  All other systems reviewed and are otherwise negative except as noted above.    Physical Exam    VS:  BP 138/88 (BP Location: Left Arm, Patient Position: Sitting, Cuff Size: Normal)   Pulse 83   Ht 5\' 7"  (1.702 m)   Wt 217 lb (98.4 kg)   SpO2 98%   BMI 33.99 kg/m  , BMI Body mass index is 33.99 kg/m.    Orthostatic VS for the past 24 hrs:  BP- Lying Pulse- Lying BP- Sitting Pulse- Sitting BP- Standing at 0 minutes Pulse- Standing at 0 minutes  10/25/22 1332 (!) 140/100 86 (!) 140/100 88 139/87 95  *Patient felt dizzy while standing  GEN: Obese, in Osborn acute distress. HEENT: normal. Neck: Supple, is, difficult to gauge JVP.  Osborn bruits or masses.   Cardiac: RRR, Osborn murmurs, rubs, or gallops. Osborn clubbing, cyanosis, edema.  Radials/PT 2+ and equal bilaterally.  Respiratory:  Respirations regular and unlabored, clear to auscultation bilaterally. GI: Soft, nontender, nondistended, BS + x 4. MS: Osborn deformity or atrophy. Skin: warm and dry,  Osborn rash. Neuro:  Strength and sensation are intact. Psych: Normal affect.  Accessory Clinical Findings    ECG personally reviewed by me today -regular sinus rhythm, 83- Osborn acute changes.  Lab Results  Component Value Date   WBC 9.2 10/04/2022   HGB 13.1 10/04/2022   HCT 39.5 10/04/2022   MCV 84.2 10/04/2022   PLT 325 10/04/2022   Lab Results  Component Value Date   CREATININE 0.75 10/04/2022   BUN 10 10/04/2022   NA 141 10/04/2022   K 4.3 10/04/2022   CL 110 10/04/2022   CO2 23 10/04/2022   Lab Results  Component Value Date   ALT 16 10/04/2022   AST 17 10/04/2022   ALKPHOS 124 10/04/2022   BILITOT 0.2 (L) 10/04/2022   Lab Results  Component Value Date   CHOL 176 11/18/2009   HDL 37 (L) 11/18/2009   LDLCALC 127 (H) 11/18/2009   TRIG 62 11/18/2009   CHOLHDL 4.8 Ratio 11/18/2009    Lab Results  Component Value Date   HGBA1C 4.8 12/17/2019    Assessment & Plan    1.  Presyncope/syncope: As previously noted, patient with a long history of syncope which is relatively sudden onset occurring at either rest or while standing/with activity about 2-3 times per month.  At previous visit, she was set up for a ZIO monitor, which she is currently wearing.  On October 24, she awoke feeling lightheaded and feeling as though her hands and face were swollen.  She felt very weak and even reported diffuse numbness.  She was seen by paramedic and was noted to be weak.  She was told that she might be dehydrated and offered IV fluids but declined and states stay at home and increased her hydration.  Osborn recurrent symptoms.  I was able to review monitoring from that day via [facility] suite and Osborn significant events occurred.  Reassurance offered.  Awaiting completion of monitor to rule out other arrhythmias as they might relate to prior history of presyncope and syncope.  I did advise that if she has any recurrence of numbness or weakness, that she is to seek ED evaluation as bigger concern would be  stroke symptoms.  2.  Diffuse weakness and numbness: See 1.  Recommend neuro eval for/ED eval for any recurrent symptoms.  3.  Coronary artery disease: Status post non-STEMI in April 2017 with drug-eluting stent placement to the LAD and first diagonal.  She has not been have any chest pain but notes chronic dyspnea on exertion and is pending echocardiogram, which is currently scheduled for early November.  She is now back on aspirin and rosuvastatin therapy.  4.  Essential hypertension: Blood pressure elevated today however, with recent history of weakness and lightheadedness, along with lightheadedness when standing for orthostatic vital signs today, will hold off on adding an antihypertensive at this time.  Patient encouraged to follow blood pressure at home.  5.  Hyperlipidemia: She reports compliance with rosuvastatin.  Says that lipids are followed by primary care.  LDL goal is less than 70.  6.  Cholelithiasis/preoperative cardiovascular examination: Pending laparoscopic cholecystectomy which is currently scheduled for November 7.  As above, she is currently undergoing monitoring in the setting of history of syncope and is also pending echocardiogram in the setting of history of dyspnea.  Defer evaluation of fitness for OR pending studies.  7.  Tobacco abuse: Complete cessation advised.  8.  Disposition: Follow-up Zio monitor and echocardiogram.  Follow-up in clinic in approximately 1 month or sooner as necessary. Murray Hodgkins, NP 10/25/2022, 1:58 PM

## 2022-10-25 NOTE — Patient Instructions (Signed)
Medication Instructions:   Your physician recommends that you continue on your current medications as directed. Please refer to the Current Medication list given to you today.  *If you need a refill on your cardiac medications before your next appointment, please call your pharmacy*   Lab Work:  NONE  If you have labs (blood work) drawn today and your tests are completely normal, you will receive your results only by: [place] (if you have MyChart) OR A paper copy in the mail If you have any lab test that is abnormal or we need to change your treatment, we will call you to review the results.   Testing/Procedures:  NONE   Follow-Up: At [facility], you and your health needs are our priority.  As part of our continuing mission to provide you with exceptional heart care, we have created designated Provider Care Teams.  These Care Teams include your primary Cardiologist (physician) and Advanced Practice Providers (APPs -  Physician Assistants and Nurse Practitioners) who all work together to provide you with the care you need, when you need it.  We recommend signing up for the patient portal called "MyChart".  Sign up information is provided on this After Visit Summary.  MyChart is used to connect with patients for Virtual Visits (Telemedicine).  Patients are able to view lab/test results, encounter notes, upcoming appointments, etc.  Non-urgent messages can be sent to your provider as well.   To learn more about what you can do with MyChart, go to [email or web address].    Your next appointment:    Keep your current appointment:  11/14/2022 @ 10:55 am with Ignacia Bayley, NP   Provider:   You may see Ida Rogue, MD or one of the following Advanced Practice Providers on your designated Care Team:   Murray Hodgkins, NP Christell Faith, PA-C Cadence Kathlen Mody, PA-C Gerrie Nordmann, NP    Important Information About Sugar

## 2022-10-25 NOTE — Telephone Encounter (Signed)
Patient came in for appointment and assessment. Closing encounter.

## 2022-10-29 ENCOUNTER — Ambulatory Visit: Payer: Self-pay | Admitting: Surgery

## 2022-10-29 ENCOUNTER — Other Ambulatory Visit: Payer: Medicaid Other

## 2022-10-29 NOTE — Progress Notes (Signed)
Surgery orders requested via Epic inbox. °

## 2022-11-02 ENCOUNTER — Ambulatory Visit (HOSPITAL_COMMUNITY): Payer: Medicaid Other | Attending: Nurse Practitioner

## 2022-11-02 DIAGNOSIS — R0609 Other forms of dyspnea: Secondary | ICD-10-CM | POA: Diagnosis present

## 2022-11-02 LAB — ECHOCARDIOGRAM COMPLETE
Area-P 1/2: 4.83 cm2
S' Lateral: 2.2 cm

## 2022-11-02 NOTE — Patient Instructions (Signed)
DUE TO COVID-19 ONLY TWO VISITORS  (aged 36 and older)  ARE ALLOWED TO COME WITH YOU AND STAY IN THE WAITING ROOM ONLY DURING PRE OP AND PROCEDURE.   **NO VISITORS ARE ALLOWED IN THE SHORT STAY AREA OR RECOVERY ROOM!!**  IF YOU WILL BE ADMITTED INTO THE HOSPITAL YOU ARE ALLOWED ONLY FOUR SUPPORT PEOPLE DURING VISITATION HOURS ONLY (7 AM -8PM)   The support person(s) must pass our screening, gel in and out, and wear a mask at all times, including in the patient's room. Patients must also wear a mask when staff or their support person are in the room. Visitors GUEST BADGE MUST BE WORN VISIBLY  One adult visitor may remain with you overnight and MUST be in the room by 8 P.M.     Your procedure is scheduled on: 11/06/22   Report to [facility] Main Entrance    Report to admitting at 1:15 PM   Call this number if you have problems the morning of surgery [phone]   Do not eat food :After Midnight.   After Midnight you may have the following liquids until _12:30___PM DAY OF SURGERY  Water Black Coffee (sugar ok, NO MILK/CREAM OR CREAMERS)  Tea (sugar ok, NO MILK/CREAM OR CREAMERS) regular and decaf                             Plain Jell-O (NO RED)                                           Fruit ices (not with fruit pulp, NO RED)                                     Popsicles (NO RED)                                                                  Juice: apple, WHITE grape, WHITE cranberry Sports drinks like Gatorade (NO RED)                        If you have questions, please contact your surgeon's office.   FOLLOW BOWEL PREP AND ANY ADDITIONAL PRE OP INSTRUCTIONS YOU RECEIVED FROM YOUR SURGEON'S OFFICE!!!     Oral Hygiene is also important to reduce your risk of infection.                                    Remember - BRUSH YOUR TEETH THE MORNING OF SURGERY WITH YOUR REGULAR TOOTHPASTE   Do NOT smoke after Midnight   Take these medicines the morning of surgery with  A SIP OF WATER: Escitalopram-Lexapro  Rosuvastatin-Crestor   Bring CPAP mask and tubing day of surgery.                              You may not have any metal on your body including hair pins, jewelry, and body piercing             Do not wear make-up, lotions, powders, perfumes, or deodorant  Do not wear nail polish including gel and S&S, artificial/acrylic nails, or any other type of covering on natural nails including finger and toenails. If you have artificial nails, gel coating, etc. that needs to be removed by a nail salon please have this removed prior to surgery or surgery may need to be canceled/ delayed if the surgeon/ anesthesia feels like they are unable to be safely monitored.   Do not shave  48 hours prior to surgery.     Do not bring valuables to the hospital. [place].   Contacts, dentures or bridgework may not be worn into surgery.       Patients discharged on the day of surgery will not be allowed to drive home.  Someone NEEDS to stay with you for the first 24 hours after anesthesia.   Special Instructions: Bring a copy of your healthcare power of attorney and living will documents  the day of surgery if you haven't scanned them before.              Please read over the following fact sheets you were given: IF YOU HAVE QUESTIONS ABOUT YOUR PRE-OP INSTRUCTIONS PLEASE CALL [phone]    [facility] - Preparing for Surgery Before surgery, you can play an important role.  Because skin is not sterile, your skin needs to be as free of germs as possible.  You can reduce the number of germs on your skin by washing with CHG (chlorahexidine gluconate) soap before surgery.  CHG is an antiseptic cleaner which kills germs and bonds with the skin to continue killing germs even after washing. Please DO NOT use  if you have an allergy to CHG or antibacterial soaps.  If your skin becomes reddened/irritated stop using the CHG and inform your nurse when you arrive at Short Stay. Do not shave (including legs and underarms) for at least 48 hours prior to the first CHG shower.   Please follow these instructions carefully:  1.  Shower with CHG Soap the night before surgery and the  morning of Surgery.  2.  If you choose to wash your hair, wash your hair first as usual with your  normal  shampoo.  3.  After you shampoo, rinse your hair and body thoroughly to remove the  shampoo.                            4.  Use CHG as you would any other liquid soap.  You can apply chg directly  to the skin and wash                       Gently with a scrungie or clean washcloth.  5.  Apply the CHG Soap to your body ONLY FROM THE NECK DOWN.   Do not use on face/ open  Wound or open sores. Avoid contact with eyes, ears mouth and genitals (private parts).                       Wash face,  Genitals (private parts) with your normal soap.             6.  Wash thoroughly, paying special attention to the area where your surgery  will be performed.  7.  Thoroughly rinse your body with warm water from the neck down.  8.  DO NOT shower/wash with your normal soap after using and rinsing off  the CHG Soap.                9.  Pat yourself dry with a clean towel.            10.  Wear clean pajamas.            11.  Place clean sheets on your bed the night of your first shower and do not  sleep with pets. Day of Surgery : Do not apply any lotions/deodorants the morning of surgery.  Please wear clean clothes to the hospital/surgery center.  FAILURE TO FOLLOW THESE INSTRUCTIONS MAY RESULT IN THE CANCELLATION OF YOUR SURGERY    ________________________________________________________________________

## 2022-11-05 ENCOUNTER — Encounter (HOSPITAL_COMMUNITY): Payer: Self-pay

## 2022-11-05 ENCOUNTER — Encounter (HOSPITAL_COMMUNITY)
Admission: RE | Admit: 2022-11-05 | Discharge: 2022-11-05 | Disposition: A | Discharge status: Home / Self Care | Payer: Medicaid Other | Source: Ambulatory Visit | Attending: Surgery | Admitting: Surgery

## 2022-11-05 ENCOUNTER — Telehealth: Payer: Self-pay | Admitting: Nurse Practitioner

## 2022-11-05 ENCOUNTER — Other Ambulatory Visit: Payer: Self-pay

## 2022-11-05 DIAGNOSIS — E0500 Thyrotoxicosis with diffuse goiter without thyrotoxic crisis or storm: Secondary | ICD-10-CM | POA: Diagnosis not present

## 2022-11-05 DIAGNOSIS — Z01812 Encounter for preprocedural laboratory examination: Secondary | ICD-10-CM | POA: Insufficient documentation

## 2022-11-05 DIAGNOSIS — I10 Essential (primary) hypertension: Secondary | ICD-10-CM | POA: Insufficient documentation

## 2022-11-05 DIAGNOSIS — I2510 Atherosclerotic heart disease of native coronary artery without angina pectoris: Secondary | ICD-10-CM | POA: Diagnosis not present

## 2022-11-05 DIAGNOSIS — E119 Type 2 diabetes mellitus without complications: Secondary | ICD-10-CM | POA: Insufficient documentation

## 2022-11-05 DIAGNOSIS — K8020 Calculus of gallbladder without cholecystitis without obstruction: Secondary | ICD-10-CM | POA: Insufficient documentation

## 2022-11-05 DIAGNOSIS — F17200 Nicotine dependence, unspecified, uncomplicated: Secondary | ICD-10-CM | POA: Diagnosis not present

## 2022-11-05 LAB — BASIC METABOLIC PANEL
Anion gap: 4 — ABNORMAL LOW (ref 5–15)
BUN: 8 mg/dL (ref 6–20)
CO2: 25 mmol/L (ref 22–32)
Calcium: 8.9 mg/dL (ref 8.9–10.3)
Chloride: 109 mmol/L (ref 98–111)
Creatinine, Ser: 0.7 mg/dL (ref 0.44–1.00)
GFR, Estimated: >60 mL/min (ref >60)
Glucose, Bld: 129 mg/dL — ABNORMAL HIGH (ref 70–99)
Potassium: 4.2 mmol/L (ref 3.5–5.1)
Sodium: 138 mmol/L (ref 135–145)

## 2022-11-05 LAB — CBC
HCT: 38 % (ref 36.0–46.0)
Hemoglobin: 12.2 g/dL (ref 12.0–15.0)
MCH: 27.4 pg (ref 26.0–34.0)
MCHC: 32.1 g/dL (ref 30.0–36.0)
MCV: 85.2 fL (ref 80.0–100.0)
Platelets: 285 10*3/uL (ref 150–400)
RBC: 4.46 MIL/uL (ref 3.87–5.11)
RDW: 13.7 % (ref 11.5–15.5)
WBC: 7.9 10*3/uL (ref 4.0–10.5)
nRBC: 0 % (ref 0.0–0.2)

## 2022-11-05 LAB — GLUCOSE, CAPILLARY: Glucose-Capillary: 147 mg/dL — ABNORMAL HIGH (ref 70–99)

## 2022-11-05 NOTE — Progress Notes (Signed)
Anesthesia note:  Bowel prep reminder:  NA  PCP - Cherly Beach NP Cardiologist -Dr. Johnny Bridge 10/25/22 Other-   Chest x-ray - 07/04/22-epic EKG - 10/25/22-epic Stress Test - no ECHO - 2020 Cardiac Cath - with stent 2017 CABG-no Pacemaker/ICD device last checked:NA  Sleep Study - no CPAP - no   CBG at PAT visit-147 Fasting Blood Sugar at home-79-180 Checks Blood Sugar _QID____  Blood Thinner:ASA 81 mg Blood Thinner Instructions: Aspirin Instructions: Last Dose:11/01/22  Anesthesia review: Yes reason:Cardiac  Patient denies shortness of breath, fever, cough and chest pain at PAT appointment. Pt was upset this morning and BP was elevated because of neighborhood police activity. No SOB   Patient verbalized understanding of instructions that were given to them at the PAT appointment. Patient was also instructed that they will need to review over the PAT instructions again at home before surgery.yes

## 2022-11-05 NOTE — Progress Notes (Signed)
Anesthesia Chart Review   Case: [id number] Date/Time: 11/06/22 1515   Procedure: LAPAROSCOPIC CHOLECYSTECTOMY   Anesthesia type: General   Pre-op diagnosis: CHOLELITHIASIS   Location: [facility] ROOM 02 / [facility]   Surgeons: Quentin Ore, MD       DISCUSSION:36 y.o. smoker with h/o HTN, CAD (DES), DM II, Graves disease, cholelithiasis scheduled for above procedure 11/06/2022 with Dr. Ivar Drape.   Pt seen by cardiology 10/25/2022. Per OV note, "Cholelithiasis/preoperative cardiovascular examination: Pending laparoscopic cholecystectomy which is currently scheduled for November 7.  As above, she is currently undergoing monitoring in the setting of history of syncope and is also pending echocardiogram in the setting of history of dyspnea.  Defer evaluation of fitness for OR pending studies."  Echo 11/02/2022 with EF 60-65%, no valvular problems.  VS: Pulse 78   Temp 36.7 C (Oral)   Resp 18   Ht 5\' 7"  (1.702 m)   Wt 95.3 kg   LMP 10/31/2022   SpO2 99%   BMI 32.89 kg/m   PROVIDERS: Pcp, No  Cardiologist -Dr. 13/12/2021  LABS: Labs reviewed: Acceptable for surgery. (all labs ordered are listed, but only abnormal results are displayed)  Labs Reviewed  BASIC METABOLIC PANEL - Abnormal; Notable for the following components:      Result Value   Glucose, Bld 129 (*)    Anion gap 4 (*)    All other components within normal limits  GLUCOSE, CAPILLARY - Abnormal; Notable for the following components:   Glucose-Capillary 147 (*)    All other components within normal limits  CBC     IMAGES:   EKG:   CV: Echo 11/02/2022  1. Left ventricular ejection fraction, by estimation, is 60 to 65%. The  left ventricle has normal function. The left ventricle has no regional  wall motion abnormalities. Left ventricular diastolic parameters were  normal.   2. Right ventricular systolic function is normal. The right ventricular  size is normal. Tricuspid regurgitation signal is  inadequate for assessing  PA pressure.   3. The mitral valve is normal in structure. No evidence of mitral valve  regurgitation.   4. The aortic valve is tricuspid. Aortic valve regurgitation is not  visualized.   5. The inferior vena cava is normal in size with greater than 50%  respiratory variability, suggesting right atrial pressure of 3 mmHg.   Comparison(s): No significant change from prior study.  Past Medical History:  Diagnosis Date   Anemia    Anxiety    Asthma    "grew out' not since teenager   Bipolar 1 disorder (HCC)    CAD (coronary artery disease)    a. 03/2016 NSTEMI/PCI: DES to LAD and D1.   Depression    Diabetes mellitus    not currently on meds, normal A1c   Discoloration of skin of foot 02/2020   pt wakes up with purple feet every am. it dissipates as day goes on.   Graves disease    History of echocardiogram    a. 08/2019 Echo: EF 60-65%, no signif valvular dzs.   Hydradenitis    Hyperlipidemia LDL goal <70    Hypertension    Hyperthyroidism    MI (myocardial infarction) (HCC) 2017   Neuropathy    Syncope 02/2020   encouraged to hydrate and have snacks nearby. has passed out. happens several x a week.   Tachycardia     Past Surgical History:  Procedure Laterality Date   ADENOIDECTOMY  2003  age 60   [place]  2018   2020. 2021   CESAREAN SECTION N/A 05/05/2019   Procedure: REPEAT CESAREAN SECTION;  Surgeon: Harlin Heys, MD;  Location: [facility];  Service: Obstetrics;  Laterality: N/A;   CESAREAN SECTION WITH BILATERAL TUBAL LIGATION Bilateral 03/25/2020   Procedure: CESAREAN SECTION WITH BILATERAL TUBAL LIGATION;  Surgeon: Rubie Maid, MD;  Location: [facility] ORS;  Service: Obstetrics;  Laterality: Bilateral;  Repeat   CORONARY ANGIOPLASTY WITH STENT PLACEMENT  03/2016   2 stents   DILATION AND CURETTAGE OF UTERUS  2014   EXCISION OF HYDRADENITIS  2009, 2012   REMOVAL OF SWEAT GLANDS   TONSILLECTOMY     2003     MEDICATIONS:  aspirin EC 81 MG tablet   escitalopram (LEXAPRO) 5 MG tablet   hydrocortisone (ANUSOL-HC) 2.5 % rectal cream   ibuprofen (ADVIL) 200 MG tablet   linaclotide (LINZESS) 72 MCG capsule   pantoprazole (PROTONIX) 40 MG tablet   QUEtiapine (SEROQUEL) 100 MG tablet   rosuvastatin (CRESTOR) 10 MG tablet   sucralfate (CARAFATE) 1 GM/10ML suspension   No current facility-administered medications for this encounter.     Michelle Felix Ward, PA-C [facility] [phone]

## 2022-11-05 NOTE — Telephone Encounter (Signed)
   Patient Name: Michelle Osborn  DOB: 1986-04-13 MRN: [id number]  Primary Cardiologist: Ida Rogue, MD  Chart reviewed as part of pre-operative protocol coverage. Pt recently seen r/t complaints of DOE, syncope, and preop eval.  Recent echo w/ nl LV fxn and w/o significant valvular dzs.  Zio monitoring incomplete, but review of available strips to date notable for 2 brief runs of PSVT (10/26 & 10/28), both resulting in symptoms, but no prolonged arrhythmias or pauses.  Given recent findings, based on ACC/AHA guidelines, Michelle Osborn is at acceptable risk for the planned procedure without further cardiovascular testing.   Please call with questions.  Murray Hodgkins, NP 11/05/2022, 6:59 PM

## 2022-11-06 ENCOUNTER — Other Ambulatory Visit: Payer: Self-pay

## 2022-11-06 ENCOUNTER — Ambulatory Visit (HOSPITAL_COMMUNITY)
Admission: RE | Admit: 2022-11-06 | Discharge: 2022-11-06 | Disposition: A | Discharge status: Home / Self Care | Payer: Medicaid Other | Attending: Surgery | Admitting: Surgery

## 2022-11-06 ENCOUNTER — Ambulatory Visit (HOSPITAL_COMMUNITY): Payer: Medicaid Other | Admitting: Physician Assistant

## 2022-11-06 ENCOUNTER — Encounter (HOSPITAL_COMMUNITY)
Admission: RE | Disposition: A | Discharge status: Home / Self Care | Payer: Self-pay | Source: Home / Self Care | Attending: Surgery

## 2022-11-06 ENCOUNTER — Ambulatory Visit (HOSPITAL_BASED_OUTPATIENT_CLINIC_OR_DEPARTMENT_OTHER): Payer: Medicaid Other | Admitting: Anesthesiology

## 2022-11-06 ENCOUNTER — Encounter (HOSPITAL_COMMUNITY): Payer: Self-pay | Admitting: Surgery

## 2022-11-06 ENCOUNTER — Ambulatory Visit (HOSPITAL_COMMUNITY): Payer: Medicaid Other

## 2022-11-06 DIAGNOSIS — I2510 Atherosclerotic heart disease of native coronary artery without angina pectoris: Secondary | ICD-10-CM | POA: Insufficient documentation

## 2022-11-06 DIAGNOSIS — K8010 Calculus of gallbladder with chronic cholecystitis without obstruction: Secondary | ICD-10-CM | POA: Insufficient documentation

## 2022-11-06 DIAGNOSIS — F319 Bipolar disorder, unspecified: Secondary | ICD-10-CM | POA: Insufficient documentation

## 2022-11-06 DIAGNOSIS — K8020 Calculus of gallbladder without cholecystitis without obstruction: Secondary | ICD-10-CM

## 2022-11-06 DIAGNOSIS — I252 Old myocardial infarction: Secondary | ICD-10-CM | POA: Insufficient documentation

## 2022-11-06 DIAGNOSIS — E119 Type 2 diabetes mellitus without complications: Secondary | ICD-10-CM | POA: Diagnosis not present

## 2022-11-06 DIAGNOSIS — I471 Supraventricular tachycardia, unspecified: Secondary | ICD-10-CM | POA: Diagnosis not present

## 2022-11-06 DIAGNOSIS — J45909 Unspecified asthma, uncomplicated: Secondary | ICD-10-CM

## 2022-11-06 DIAGNOSIS — I10 Essential (primary) hypertension: Secondary | ICD-10-CM

## 2022-11-06 DIAGNOSIS — K219 Gastro-esophageal reflux disease without esophagitis: Secondary | ICD-10-CM | POA: Diagnosis not present

## 2022-11-06 DIAGNOSIS — F17210 Nicotine dependence, cigarettes, uncomplicated: Secondary | ICD-10-CM

## 2022-11-06 HISTORY — PX: CHOLECYSTECTOMY: SHX55

## 2022-11-06 LAB — GLUCOSE, CAPILLARY: Glucose-Capillary: 152 mg/dL — ABNORMAL HIGH (ref 70–99)

## 2022-11-06 SURGERY — LAPAROSCOPIC CHOLECYSTECTOMY
Anesthesia: General

## 2022-11-06 MED ORDER — PROPOFOL 10 MG/ML IV BOLUS
INTRAVENOUS | Status: AC
  Filled 2022-11-06: qty 20

## 2022-11-06 MED ORDER — FENTANYL CITRATE PF 50 MCG/ML IJ SOSY
25.0000 ug | PREFILLED_SYRINGE | INTRAMUSCULAR | Status: DC | PRN
  Administered 2022-11-06: 50 ug via INTRAVENOUS

## 2022-11-06 MED ORDER — MIDAZOLAM HCL 2 MG/2ML IJ SOLN
INTRAMUSCULAR | Status: AC
  Filled 2022-11-06: qty 2

## 2022-11-06 MED ORDER — OXYCODONE HCL 5 MG PO TABS: 5.0000 mg | ORAL_TABLET | Freq: Once | ORAL | Status: DC | PRN

## 2022-11-06 MED ORDER — CHLORHEXIDINE GLUCONATE CLOTH 2 % EX PADS: 6.0000 | MEDICATED_PAD | Freq: Once | CUTANEOUS | Status: DC

## 2022-11-06 MED ORDER — ACETAMINOPHEN 10 MG/ML IV SOLN: 1000.0000 mg | Freq: Once | INTRAVENOUS | Status: DC | PRN

## 2022-11-06 MED ORDER — OXYCODONE HCL 5 MG PO TABS: 5.0000 mg | ORAL_TABLET | Freq: Three times a day (TID) | ORAL | 0 refills | Status: AC | PRN

## 2022-11-06 MED ORDER — ROCURONIUM BROMIDE 10 MG/ML (PF) SYRINGE
PREFILLED_SYRINGE | INTRAVENOUS | Status: DC | PRN
  Administered 2022-11-06: 60 mg via INTRAVENOUS

## 2022-11-06 MED ORDER — FENTANYL CITRATE PF 50 MCG/ML IJ SOSY
PREFILLED_SYRINGE | INTRAMUSCULAR | Status: AC
  Administered 2022-11-06: 50 ug via INTRAVENOUS
  Filled 2022-11-06: qty 2

## 2022-11-06 MED ORDER — PROPOFOL 10 MG/ML IV BOLUS
INTRAVENOUS | Status: DC | PRN
  Administered 2022-11-06: 160 mg via INTRAVENOUS

## 2022-11-06 MED ORDER — BUPIVACAINE-EPINEPHRINE (PF) 0.25% -1:200000 IJ SOLN
INTRAMUSCULAR | Status: AC
  Filled 2022-11-06: qty 30

## 2022-11-06 MED ORDER — VANCOMYCIN HCL IN DEXTROSE 1-5 GM/200ML-% IV SOLN
1000.0000 mg | INTRAVENOUS | Status: AC
  Administered 2022-11-06: 1000 mg via INTRAVENOUS
  Filled 2022-11-06: qty 200

## 2022-11-06 MED ORDER — FENTANYL CITRATE (PF) 250 MCG/5ML IJ SOLN
INTRAMUSCULAR | Status: AC
  Filled 2022-11-06: qty 5

## 2022-11-06 MED ORDER — ONDANSETRON HCL 4 MG/2ML IJ SOLN
INTRAMUSCULAR | Status: AC
  Filled 2022-11-06: qty 2

## 2022-11-06 MED ORDER — SUGAMMADEX SODIUM 200 MG/2ML IV SOLN
INTRAVENOUS | Status: DC | PRN
  Administered 2022-11-06: 400 mg via INTRAVENOUS

## 2022-11-06 MED ORDER — LABETALOL HCL 5 MG/ML IV SOLN
INTRAVENOUS | Status: DC | PRN
  Administered 2022-11-06: 5 mg via INTRAVENOUS

## 2022-11-06 MED ORDER — LIDOCAINE 2% (20 MG/ML) 5 ML SYRINGE
INTRAMUSCULAR | Status: DC | PRN
  Administered 2022-11-06: 100 mg via INTRAVENOUS

## 2022-11-06 MED ORDER — FENTANYL CITRATE (PF) 100 MCG/2ML IJ SOLN
INTRAMUSCULAR | Status: DC | PRN
  Administered 2022-11-06 (×2): 50 ug via INTRAVENOUS
  Administered 2022-11-06: 100 ug via INTRAVENOUS
  Administered 2022-11-06: 50 ug via INTRAVENOUS

## 2022-11-06 MED ORDER — BUPIVACAINE-EPINEPHRINE 0.25% -1:200000 IJ SOLN
INTRAMUSCULAR | Status: DC | PRN
  Administered 2022-11-06: 20 mL

## 2022-11-06 MED ORDER — ORAL CARE MOUTH RINSE: 15.0000 mL | Freq: Once | OROMUCOSAL | Status: AC

## 2022-11-06 MED ORDER — GABAPENTIN 300 MG PO CAPS
300.0000 mg | ORAL_CAPSULE | ORAL | Status: AC
  Administered 2022-11-06: 300 mg via ORAL
  Filled 2022-11-06: qty 1

## 2022-11-06 MED ORDER — LACTATED RINGERS IR SOLN
Status: DC | PRN
  Administered 2022-11-06: 1000 mL

## 2022-11-06 MED ORDER — ONDANSETRON HCL 4 MG/2ML IJ SOLN
INTRAMUSCULAR | Status: DC | PRN
  Administered 2022-11-06: 4 mg via INTRAVENOUS

## 2022-11-06 MED ORDER — MIDAZOLAM HCL 5 MG/5ML IJ SOLN
INTRAMUSCULAR | Status: DC | PRN
  Administered 2022-11-06: 2 mg via INTRAVENOUS

## 2022-11-06 MED ORDER — ONDANSETRON HCL 4 MG/2ML IJ SOLN: 4.0000 mg | Freq: Once | INTRAMUSCULAR | Status: DC | PRN

## 2022-11-06 MED ORDER — LACTATED RINGERS IV SOLN
INTRAVENOUS | Status: DC
  Administered 2022-11-06: 1000 mL via INTRAVENOUS

## 2022-11-06 MED ORDER — SUGAMMADEX SODIUM 500 MG/5ML IV SOLN
INTRAVENOUS | Status: AC
  Filled 2022-11-06: qty 5

## 2022-11-06 MED ORDER — BUPIVACAINE LIPOSOME 1.3 % IJ SUSP: 20.0000 mL | Freq: Once | INTRAMUSCULAR | Status: DC

## 2022-11-06 MED ORDER — OXYCODONE HCL 5 MG/5ML PO SOLN: 5.0000 mg | Freq: Once | ORAL | Status: DC | PRN

## 2022-11-06 MED ORDER — CHLORHEXIDINE GLUCONATE 0.12 % MT SOLN
15.0000 mL | Freq: Once | OROMUCOSAL | Status: AC
  Administered 2022-11-06: 15 mL via OROMUCOSAL

## 2022-11-06 MED ORDER — DEXAMETHASONE SODIUM PHOSPHATE 10 MG/ML IJ SOLN
INTRAMUSCULAR | Status: DC | PRN
  Administered 2022-11-06: 10 mg via INTRAVENOUS

## 2022-11-06 MED ORDER — 0.9 % SODIUM CHLORIDE (POUR BTL) OPTIME
TOPICAL | Status: DC | PRN
  Administered 2022-11-06: 1000 mL

## 2022-11-06 SURGICAL SUPPLY — 45 items
ADH SKN CLS APL DERMABOND .7 (GAUZE/BANDAGES/DRESSINGS) ×1
APL PRP STRL LF DISP 70% ISPRP (MISCELLANEOUS) ×1
APPLIER CLIP ROT 10 11.4 M/L (STAPLE) ×1
APR CLP MED LRG 11.4X10 (STAPLE) ×1
BAG COUNTER SPONGE SURGICOUNT (BAG) IMPLANT
BAG SPEC RTRVL 10 TROC 200 (ENDOMECHANICALS) ×1
BAG SPNG CNTER NS LX DISP (BAG)
CABLE HIGH FREQUENCY MONO STRZ (ELECTRODE) ×2 IMPLANT
CATH URETL OPEN 5X70 (CATHETERS) IMPLANT
CHLORAPREP W/TINT 26 (MISCELLANEOUS) ×2 IMPLANT
CLIP APPLIE ROT 10 11.4 M/L (STAPLE) ×2 IMPLANT
COVER MAYO STAND XLG (MISCELLANEOUS) ×2 IMPLANT
COVER SURGICAL LIGHT HANDLE (MISCELLANEOUS) ×2 IMPLANT
DERMABOND ADVANCED .7 DNX12 (GAUZE/BANDAGES/DRESSINGS) ×2 IMPLANT
DRAPE C-ARM 42X120 X-RAY (DRAPES) IMPLANT
ELECT REM PT RETURN 15FT ADLT (MISCELLANEOUS) ×2 IMPLANT
ENDOLOOP SUT PDS II  0 18 (SUTURE)
ENDOLOOP SUT PDS II 0 18 (SUTURE) ×2 IMPLANT
GLOVE BIO SURGEON STRL SZ7.5 (GLOVE) ×2 IMPLANT
GLOVE BIOGEL PI IND STRL 8 (GLOVE) ×2 IMPLANT
GOWN STRL REUS W/ TWL XL LVL3 (GOWN DISPOSABLE) ×4 IMPLANT
GOWN STRL REUS W/TWL XL LVL3 (GOWN DISPOSABLE) ×2
GRASPER SUT TROCAR 14GX15 (MISCELLANEOUS) IMPLANT
HEMOSTAT SNOW SURGICEL 2X4 (HEMOSTASIS) IMPLANT
IRRIG SUCT STRYKERFLOW 2 WTIP (MISCELLANEOUS) ×1
IRRIGATION SUCT STRKRFLW 2 WTP (MISCELLANEOUS) ×2 IMPLANT
IV CATH 14GX2 1/4 (CATHETERS) ×2 IMPLANT
KIT BASIN OR (CUSTOM PROCEDURE TRAY) ×2 IMPLANT
KIT TURNOVER KIT A (KITS) IMPLANT
NDL INSUFFLATION 14GA 120MM (NEEDLE) ×2 IMPLANT
NEEDLE INSUFFLATION 14GA 120MM (NEEDLE) ×1 IMPLANT
PENCIL SMOKE EVACUATOR (MISCELLANEOUS) IMPLANT
POUCH RETRIEVAL ECOSAC 10 (ENDOMECHANICALS) ×2 IMPLANT
POUCH RETRIEVAL ECOSAC 10MM (ENDOMECHANICALS) ×1
SCISSORS LAP 5X35 DISP (ENDOMECHANICALS) ×2 IMPLANT
SET TUBE SMOKE EVAC HIGH FLOW (TUBING) ×2 IMPLANT
SLEEVE Z-THREAD 5X100MM (TROCAR) ×4 IMPLANT
SPIKE FLUID TRANSFER (MISCELLANEOUS) ×2 IMPLANT
STOPCOCK 4 WAY LG BORE MALE ST (IV SETS) IMPLANT
SUT MNCRL AB 4-0 PS2 18 (SUTURE) ×2 IMPLANT
TOWEL OR 17X26 10 PK STRL BLUE (TOWEL DISPOSABLE) ×2 IMPLANT
TOWEL OR NON WOVEN STRL DISP B (DISPOSABLE) IMPLANT
TRAY LAPAROSCOPIC (CUSTOM PROCEDURE TRAY) ×2 IMPLANT
TROCAR ADV FIXATION 12X100MM (TROCAR) ×2 IMPLANT
TROCAR Z-THREAD OPTICAL 5X100M (TROCAR) ×2 IMPLANT

## 2022-11-06 NOTE — Anesthesia Procedure Notes (Addendum)
Procedure Name: Intubation Date/Time: 11/06/2022 3:11 PM  Performed by: Gean Maidens, CRNAPre-anesthesia Checklist: Patient identified, Emergency Drugs available, Patient being monitored, Suction available and Timeout performed Patient Re-evaluated:Patient Re-evaluated prior to induction Oxygen Delivery Method: Circle system utilized Preoxygenation: Pre-oxygenation with 100% oxygen Induction Type: IV induction Ventilation: Mask ventilation without difficulty Laryngoscope Size: Mac and 4 Grade View: Grade I Tube type: Oral Tube size: 7.0 mm Number of attempts: 1 Airway Equipment and Method: Stylet Placement Confirmation: ETT inserted through vocal cords under direct vision, positive ETCO2 and breath sounds checked- equal and bilateral Secured at: 21 cm Tube secured with: Tape Dental Injury: Teeth and Oropharynx as per pre-operative assessment

## 2022-11-06 NOTE — Anesthesia Preprocedure Evaluation (Addendum)
Anesthesia Evaluation  Patient identified by MRN, date of birth, ID band Patient awake    Reviewed: Allergy & Precautions, H&P , NPO status , Patient's Chart, lab work & pertinent test results  History of Anesthesia Complications Negative for: history of anesthetic complications  Airway Mallampati: III  TM Distance: <3 FB Neck ROM: Full    Dental  (+) Poor Dentition   Pulmonary asthma , Current Smoker and Patient abstained from smoking.   Pulmonary exam normal breath sounds clear to auscultation       Cardiovascular hypertension, + CAD and + Past MI  + dysrhythmias Supra Ventricular Tachycardia  Rhythm:Regular Rate:Normal  Echo 11/02/2022  1. Left ventricular ejection fraction, by estimation, is 60 to 65%. The  left ventricle has normal function. The left ventricle has no regional  wall motion abnormalities. Left ventricular diastolic parameters were  normal.   2. Right ventricular systolic function is normal. The right ventricular  size is normal. Tricuspid regurgitation signal is inadequate for assessing  PA pressure.   3. The mitral valve is normal in structure. No evidence of mitral valve  regurgitation.   4. The aortic valve is tricuspid. Aortic valve regurgitation is not  visualized.   5. The inferior vena cava is normal in size with greater than 50%  respiratory variability, suggesting right atrial pressure of 3 mmHg.      Neuro/Psych     Bipolar Disorder   negative neurological ROS     GI/Hepatic Neg liver ROS,GERD  ,,  Endo/Other  diabetes, Type 2 Hyperthyroidism   Renal/GU negative Renal ROS  negative genitourinary   Musculoskeletal negative musculoskeletal ROS (+)    Abdominal   Peds negative pediatric ROS (+)  Hematology negative hematology ROS (+)   Anesthesia Other Findings   Reproductive/Obstetrics negative OB ROS                             Anesthesia  Physical Anesthesia Plan  ASA: 3  Anesthesia Plan: General   Post-op Pain Management: Toradol IV (intra-op)* and Ofirmev IV (intra-op)*   Induction: Intravenous  PONV Risk Score and Plan: 3 and Ondansetron, Dexamethasone, Treatment may vary due to age or medical condition, Midazolam and Scopolamine patch - Pre-op  Airway Management Planned: Oral ETT  Additional Equipment: None  Intra-op Plan:   Post-operative Plan: Extubation in OR  Informed Consent: I have reviewed the patients History and Physical, chart, labs and discussed the procedure including the risks, benefits and alternatives for the proposed anesthesia with the patient or authorized representative who has indicated his/her understanding and acceptance.     Dental advisory given  Plan Discussed with: CRNA  Anesthesia Plan Comments: (See PAT note 11/05/2022)       Anesthesia Quick Evaluation

## 2022-11-06 NOTE — Discharge Instructions (Signed)
 CHOLECYSTECTOMY POST OPERATIVE INSTRUCTIONS  Thinking Clearly  The anesthesia may cause you to feel different for 1 or 2 days. Do not drive, drink alcohol, or make any big decisions for at least 2 days.  Nutrition When you wake up, you will be able to drink small amounts of liquid. If you do not feel sick, you can slowly advance your diet to regular foods. Continue to drink lots of fluids, usually about 8 to 10 glasses per day. Eat a high-fiber diet so you don't strain during bowel movements. High-Fiber Foods Foods high in fiber include beans, bran cereals and whole-grain breads, peas, dried fruit (figs, apricots, and dates), raspberries, blackberries, strawberries, sweet corn, broccoli, baked potatoes with skin, plums, pears, apples, greens, and nuts. Activity Slowly increase your activity. Be sure to get up and walk every hour or so to prevent blood clots. No heavy lifting or strenuous activity for 4 weeks following surgery to prevent hernias at your incision sites It is normal to feel tired. You may need more sleep than usual.  Get your rest but make sure to get up and move around frequently to prevent blood clots and pneumonia.  Work and Return to School You can go back to work when you feel well enough. Discuss the timing with your surgeon. You can usually go back to school or work 1 week after an operation. If your work requires heavy lifting or strenuous activity you need to be placed on light duty for 4 weeks following surgery. You can return to gym class, sports or other physical activities 4 weeks after surgery.  Wound Care Always wash your hands before and after touching near your incision site. Do not soak in a bathtub until cleared at your follow up appointment. You may take a shower 24 hours after surgery. A small amount of drainage from the incision is normal. If the drainage is thick and yellow or the site is red, you may have an infection, so call your surgeon. If you  have a drain in one of your incisions, it will be taken out in office when the drainage stops. Steri-Strips will fall off in 7 to 10 days or they will be removed during your first office visit. If you have dermabond glue covering over the incision, allow the glue to flake off on its own. Avoid wearing tight or rough clothing. It may rub your incisions and make it harder for them to heal. Protect the new skin, especially from the sun. The sun can burn and cause darker scarring. Your scar will heal in about 4 to 6 weeks and will become softer and continue to fade over the next year.  The cosmetic appearance of the incisions will improve over the course of the first year after surgery. Sensation around your incision will return in a few weeks or months.  Bowel Movements After intestinal surgery, you may have loose watery stools for several days. If watery diarrhea lasts longer than 3 days, contact your surgeon. Pain medication (narcotics) can cause constipation. Increase the fiber in your diet with high-fiber foods if you are constipated. You can take an over the counter stool softener like Colace to avoid constipation.  Additional over the counter medications can also be used if Colace isn't sufficient (for example, Milk of Magnesia or Miralax).  Pain The amount of pain is different for each person. Some people need only 1 to 3 doses of pain control medication, while others need more. Take alternating doses of tylenol   and ibuprofen around the clock for the first five days following surgery.  This will provide a baseline of pain control and help with inflammation.  Take the narcotic pain medication in addition if needed for severe pain.  Contact Your Surgeon at [phone], if you have: Pain in your right upper abdomen like a gallbladder attack. Pain that will not go away Pain that gets worse A fever of more than 101F (38.3C) Repeated vomiting Swelling, redness, bleeding, or bad-smelling  drainage from your wound site Strong abdominal pain No bowel movement or unable to pass gas for 3 days Watery diarrhea lasting longer than 3 days  Pain Control The goal of pain control is to minimize pain, keep you moving and help you heal. Your surgical team will work with you on your pain plan. Most often a combination of therapies and medications are used to control your pain. You may also be given medication (local anesthetic) at the surgical site. This may help control your pain for several days. Extreme pain puts extra stress on your body at a time when your body needs to focus on healing. Do not wait until your pain has reached a level "10" or is unbearable before telling your doctor or nurse. It is much easier to control pain before it becomes severe. Following a laparoscopic procedure, pain is sometimes felt in the shoulder. This is due to the gas inserted into your abdomen during the procedure. Moving and walking helps to decrease the gas and the right shoulder pain.  Use the guide below for ways to manage your post-operative pain. Learn more by going to facs.org/safepaincontrol.  How Intense Is My Pain Common Therapies to Feel Better       I hardly notice my pain, and it does not interfere with my activities.  I notice my pain and it distracts me, but I can still do activities (sitting up, walking, standing).  Non-Medication Therapies  Ice (in a bag, applied over clothing at the surgical site), elevation, rest, meditation, massage, distraction (music, TV, play) walking and mild exercise Splinting the abdomen with pillows +  Non-Opioid Medications Acetaminophen (Tylenol) Non-steroidal anti-inflammatory drugs (NSAIDS) Aspirin, Ibuprofen (Motrin, Advil) Naproxen (Aleve) Take these as needed, when you feel pain. Both acetaminophen and NSAIDs help to decrease pain and swelling (inflammation).      My pain is hard to ignore and is more noticeable even when I rest.  My  pain interferes with my usual activities.  Non-Medication Therapies  +  Non-Opioid medications  Take on a regular schedule (around-the-clock) instead of as needed. (For example, Tylenol every 6 hours at 9:00 am, 3:00 pm, 9:00 pm, 3:00 am and Motrin every 6 hours at 12:00 am, 6:00 am, 12:00 pm, 6:00 pm)         I am focused on my pain, and I am not doing my daily activities.  I am groaning in pain, and I cannot sleep. I am unable to do anything.  My pain is as bad as it could be, and nothing else matters.  Non-Medication Therapies  +  Around-the-Clock Non-Opioid Medications  +  Short-acting opioids  Opioids should be used with other medications to manage severe pain. Opioids block pain and give a feeling of euphoria (feel high). Addiction, a serious side effect of opioids, is rare with short-term (a few days) use.  Examples of short-acting opioids include: Tramadol (Ultram), Hydrocodone (Norco, Vicodin), Hydromorphone (Dilaudid), Oxycodone (Oxycontin)     The above directions have been adapted from   the American College of [employer] Surgical Patient Education Program.  Please refer to the ACS website if needed: https://www.facs.org/-/media/files/education/patient-ed/cholesys.ashx.   Amyrie Illingworth, MD [facility] [address], [address], [place], [place]  [address] ?  [address] [address], [place], [place]   [address] [phone] ? [phone] ? FAX [phone] Web site: [email or web address]  

## 2022-11-06 NOTE — H&P (Signed)
Admitting Physician: Hyman Hopes Frederic Tones  Service: General Surgery  CC: Gallstones  Subjective   HPI: Michelle Osborn is an 36 y.o. female who is here for cholecystectomy  Past Medical History:  Diagnosis Date   Anemia    Anxiety    Asthma    "grew out' not since teenager   Bipolar 1 disorder (HCC)    CAD (coronary artery disease)    a. 03/2016 NSTEMI/PCI: DES to LAD and D1.   Depression    Diabetes mellitus    not currently on meds, normal A1c   Discoloration of skin of foot 02/2020   pt wakes up with purple feet every am. it dissipates as day goes on.   Graves disease    History of echocardiogram    a. 08/2019 Echo: EF 60-65%, no signif valvular dzs.   Hydradenitis    Hyperlipidemia LDL goal <70    Hypertension    Hyperthyroidism    MI (myocardial infarction) (HCC) 2017   Neuropathy    Syncope 02/2020   encouraged to hydrate and have snacks nearby. has passed out. happens several x a week.   Tachycardia     Past Surgical History:  Procedure Laterality Date   ADENOIDECTOMY  2003   age 75   APPENDECTOMY  67   CESAREAN SECTION  2018   2020. 2021   CESAREAN SECTION N/A 05/05/2019   Procedure: REPEAT CESAREAN SECTION;  Surgeon: Linzie Collin, MD;  Location: [facility] ORS;  Service: Obstetrics;  Laterality: N/A;   CESAREAN SECTION WITH BILATERAL TUBAL LIGATION Bilateral 03/25/2020   Procedure: CESAREAN SECTION WITH BILATERAL TUBAL LIGATION;  Surgeon: Hildred Laser, MD;  Location: [facility] ORS;  Service: Obstetrics;  Laterality: Bilateral;  Repeat   CORONARY ANGIOPLASTY WITH STENT PLACEMENT  03/2016   2 stents   DILATION AND CURETTAGE OF UTERUS  2014   EXCISION OF HYDRADENITIS  2009, 2012   REMOVAL OF SWEAT GLANDS   TONSILLECTOMY     2003    Family History  Problem Relation Age of Onset   Hypertension Mother    Asthma Mother    Diabetes Mother    Hyperlipidemia Mother    Heart disease Mother    Heart failure Mother        Defib placement   Hypertension  Father    Kidney disease Father    Heart disease Father    Cancer Maternal Aunt    Cancer Maternal Grandmother     Social:  reports that she has been smoking cigarettes. She has a 2.50 pack-year smoking history. She has never used smokeless tobacco. She reports that she does not currently use drugs after having used the following drugs: Marijuana. She reports that she does not drink alcohol.  Allergies:  Allergies  Allergen Reactions   Darvocet [Propoxyphene N-Acetaminophen] Anaphylaxis    Also makes her stomach hurt   Peanut-Containing Drug Products Shortness Of Breath, Swelling and Itching   Penicillins Shortness Of Breath, Swelling and Anaphylaxis    Did it involve swelling of the face/tongue/throat, SOB, or low BP? Yes Did it involve sudden or severe rash/hives, skin peeling, or any reaction on the inside of your mouth or nose? No Did you need to seek medical attention at a hospital or doctor's office? Yes When did it last happen?      2007 If all above answers are "NO", may proceed with cephalosporin use.  Did it involve swelling of the face/tongue/throat, SOB, or low BP? Yes Did it involve sudden  or severe rash/hives, skin peeling, or any reaction on the inside of your mouth or nose? No Did you need to seek medical attention at a hospital or doctor's office? Yes When did it last happen?      2007 If all above answers are "NO", may proceed with cephalosporin use.  throat   Tomato Itching   Keflex [Cephalexin] Hives and Itching   Sulfa Antibiotics Other (See Comments)   Imitrex [Sumatriptan] Nausea And Vomiting   Toradol [Ketorolac Tromethamine] Rash    Red rash with bumps   Tramadol Nausea And Vomiting    Medications: Current Outpatient Medications  Medication Instructions   aspirin EC 81 mg, Oral, Daily, Swallow whole.   escitalopram (LEXAPRO) 5 mg, Oral, Daily at bedtime   hydrocortisone (ANUSOL-HC) 2.5 % rectal cream 1 Application, Rectal, 2 times daily PRN   ibuprofen  (ADVIL) 200-400 mg, Oral, Every 8 hours PRN   linaclotide (LINZESS) 72 mcg, Oral, Daily at bedtime   pantoprazole (PROTONIX) 40 mg, Oral, Daily at bedtime   QUEtiapine (SEROQUEL) 300 mg, Oral, Nightly   rosuvastatin (CRESTOR) 10 mg, Oral, Daily at bedtime   sucralfate (CARAFATE) 1 g, Oral, 3 times daily with meals    ROS - all of the below systems have been reviewed with the patient and positives are indicated with bold text General: chills, fever or night sweats Eyes: blurry vision or double vision ENT: epistaxis or sore throat Allergy/Immunology: itchy/watery eyes or nasal congestion Hematologic/Lymphatic: bleeding problems, blood clots or swollen lymph nodes Endocrine: temperature intolerance or unexpected weight changes Breast: new or changing breast lumps or nipple discharge Resp: cough, shortness of breath, or wheezing CV: chest pain or dyspnea on exertion GI: as per HPI GU: dysuria, trouble voiding, or hematuria MSK: joint pain or joint stiffness Neuro: TIA or stroke symptoms Derm: pruritus and skin lesion changes Psych: anxiety and depression  Objective   PE Last menstrual period 10/31/2022. Constitutional: NAD; conversant; no deformities Eyes: Moist conjunctiva; no lid lag; anicteric; PERRL Neck: Trachea midline; no thyromegaly Lungs: Normal respiratory effort; no tactile fremitus CV: RRR; no palpable thrills; no pitting edema GI: Abd soft, nontender; no palpable hepatosplenomegaly MSK: Normal range of motion of extremities; no clubbing/cyanosis Psychiatric: Appropriate affect; alert and oriented x3 Lymphatic: No palpable cervical or axillary lymphadenopathy  No results found for this or any previous visit (from the past 24 hour(s)).  Imaging Orders  No imaging studies ordered today   RUQ [place] 10/04/22 1. Cholelithiasis without secondary signs of acute cholecystitis. 2. Increased hepatic parenchymal echogenicity suggestive of steatosis. CBD Diameter  55mm    Assessment and Plan  Michelle Osborn has cholelithiasis with biliary colic.  I recommended laparoscopic cholecystectomy for the patient. The procedure, its risks, benefits and alternatives were discussed and the patient granted consent to proceed.      ICD-10-CM   1. Type 2 diabetes mellitus without complication, without long-term current use of insulin (HCC)  E11.9 CANCELED: Basic metabolic panel per protocol    CANCELED: CBC per protocol       Felicie Morn, MD  [facility] Use [email or web address] to contact on call provider

## 2022-11-06 NOTE — Transfer of Care (Signed)
Immediate Anesthesia Transfer of Care Note  Patient: Michelle Osborn  Procedure(s) Performed: LAPAROSCOPIC CHOLECYSTECTOMY  Patient Location: PACU  Anesthesia Type:General  Level of Consciousness: awake, alert , and oriented  Airway & Oxygen Therapy: Patient Spontanous Breathing and Patient connected to face mask oxygen  Post-op Assessment: Report given to RN and Post -op Vital signs reviewed and stable  Post vital signs: Reviewed and stable  Last Vitals:  Vitals Value Taken Time  BP 140/80 11/06/22 1556  Temp    Pulse 84 11/06/22 1600  Resp 16 11/06/22 1600  SpO2 100 % 11/06/22 1600  Vitals shown include unvalidated device data.  Last Pain:  Vitals:   11/06/22 1326  TempSrc:   PainSc: 0-No pain         Complications: No notable events documented.

## 2022-11-06 NOTE — Op Note (Signed)
Patient: Michelle Osborn ([birth year], [id number])  Date of Surgery: 11/06/2022   Preoperative Diagnosis: CHOLELITHIASIS   Postoperative Diagnosis: CHOLELITHIASIS   Surgical Procedure: LAPAROSCOPIC CHOLECYSTECTOMY:    Operative Team Members:  Surgeon(s) and Role:    * Myonna Chisom, Nickola Major, MD - Primary   Anesthesiologist: Audry Pili, MD CRNA: Gean Maidens, CRNA   Anesthesia: General   Fluids:  Total I/O In: -  Out: 25 [[id number]]  Complications: None  Drains:  none   Specimen:  ID Type Source Tests Collected by Time Destination  1 : gallbladder Tissue PATH Other SURGICAL PATHOLOGY Dal Blew, Nickola Major, MD 11/06/2022 1512      Disposition:  PACU - hemodynamically stable.  Plan of Care: Discharge to home after PACU    Indications for Procedure: Lavoris Canizales is a 36 y.o. female who presented with abdominal pain and gallstones.  History, physical and imaging was concerning for cholecystitis.  Laparoscopic cholecystectomy was recommended for the patient.  The procedure itself, as well as the risks, benefits and alternatives were discussed with the patient.  Risks discussed included but were not limited to the risk of infection, bleeding, damage to nearby structures, need to convert to open procedure, incisional hernia, bile leak, common bile duct injury and the need for additional procedures or surgeries.  With this discussion complete and all questions answered the patient granted consent to proceed.  Findings: Gallbladder  Infection status: Patient: Private Patient Elective Case Case: Elective Infection Present At Time Of Surgery (PATOS): None   Description of Procedure:   On the date stated above, the patient was taken to the operating room suite and placed in supine positioning.  Sequential compression devices were placed on the lower extremities to prevent blood clots.  General endotracheal anesthesia was induced. Preoperative antibiotics were given.  The  patient's abdomen was prepped and draped in the usual sterile fashion.  A time-out was completed verifying the correct patient, procedure, positioning and equipment needed for the case.  We began by anesthetizing the skin with local anesthetic and then making a 5 mm incision just below the umbilicus.  We dissected through the subcutaneous tissues to the fascia.  The fascia was grasped and elevated using a Kocher clamp.  A Veress needle was inserted into the abdomen and the abdomen was insufflated to 15 mmHg.  A 5 mm trocar was inserted in this position under optical guidance and then the abdomen was inspected.  There was no trauma to the underlying viscera with initial trocar placement.  Any abnormal findings, other than inflammation in the right upper quadrant, are listed above in the findings section.  Three additional trocars were placed, one 12 mm trocar in the subxiphoid position, one 5 mm trocar in the midline epigastric area and one 61mm trocar in the right upper quadrant subcostally.  These were placed under direct vision without any trauma to the underlying viscera.    The patient was then placed in head up, left side down positioning.  The gallbladder was identified and dissected free from its attachments to the omentum allowing the duodenum to fall away.  The infundibulum of the gallbladder was dissected free working laterally to medially.  The cystic duct and cystic artery were dissected free from surrounding connective tissue.  The infundibulum of the gallbladder was dissected off the cystic plate.  A critical view of safety was obtained with the cystic duct and cystic artery being cleared of connective tissues and clearly the only two structures entering into the  gallbladder with the liver clearly visible behind.  Clips were then applied to the cystic duct and cystic artery and then these structures were divided.  The gallbladder was dissected off the cystic plate, placed in an endocatch bag and  removed from the 12 mm subxiphoid port site.  The clips were inspected and appeared effective.  The cystic plate was inspected and hemostasis was obtained using electrocautery.  The operative field was dry on final inspection.  Attention was turned to closure.  The 12 mm subxiphoid port site was closed using a 0-vicryl suture on a fascial suture passer.  The abdomen was desufflated.  The skin was closed using 4-0 monocryl and dermabond.  All sponge and needle counts were correct at the conclusion of the case.    Ivar Drape, MD General, Bariatric, & Minimally Invasive Surgery [facility], [place]

## 2022-11-07 ENCOUNTER — Encounter (HOSPITAL_COMMUNITY): Payer: Self-pay | Admitting: Surgery

## 2022-11-07 NOTE — Anesthesia Postprocedure Evaluation (Signed)
Anesthesia Post Note  Patient: Michelle Osborn  Procedure(s) Performed: LAPAROSCOPIC CHOLECYSTECTOMY     Patient location during evaluation: PACU Anesthesia Type: General Level of consciousness: awake and alert Pain management: pain level controlled Vital Signs Assessment: post-procedure vital signs reviewed and stable Respiratory status: spontaneous breathing, nonlabored ventilation, respiratory function stable and patient connected to nasal cannula oxygen Cardiovascular status: blood pressure returned to baseline and stable Postop Assessment: no apparent nausea or vomiting Anesthetic complications: no   No notable events documented.  Last Vitals:  Vitals:   11/06/22 1800 11/06/22 1830  BP: 131/72 (!) 152/97  Pulse: 89 88  Resp: 20 13  Temp:    SpO2: 93% 92%    Last Pain:  Vitals:   11/06/22 1830  TempSrc:   PainSc: 0-No pain                 Beryle Lathe

## 2022-11-08 LAB — SURGICAL PATHOLOGY

## 2022-11-14 ENCOUNTER — Encounter: Payer: Self-pay | Admitting: Nurse Practitioner

## 2022-11-14 ENCOUNTER — Ambulatory Visit: Payer: Medicaid Other | Attending: Nurse Practitioner | Admitting: Nurse Practitioner

## 2022-11-14 NOTE — Progress Notes (Deleted)
Office Visit    Patient Name: Michelle Osborn Date of Encounter: 11/14/2022  Primary Care Provider:  Pcp, No Primary Cardiologist:  Michelle Nordmann, MD  Chief Complaint    36 y/o ? w/ a h/o CAD, HTN, HL, DM, syncope, tob abuse, PCOS, obesity, Graves' disease, and anxiety, who presents for syncope f/u after recent monitoring.  Past Medical History    Past Medical History:  Diagnosis Date   Anemia    Anxiety    Asthma    "grew out' not since teenager   Bipolar 1 disorder (HCC)    CAD (coronary artery disease)    a. 03/2016 NSTEMI/PCI: DES to LAD and D1.   Depression    Diabetes mellitus    not currently on meds, normal A1c   Discoloration of skin of foot 02/2020   pt wakes up with purple feet every am. it dissipates as day goes on.   Graves disease    History of echocardiogram    a. 08/2019 Echo: EF 60-65%, no signif valvular dzs.   Hydradenitis    Hyperlipidemia LDL goal <70    Hypertension    Hyperthyroidism    MI (myocardial infarction) (HCC) 2017   Neuropathy    Syncope 02/2020   encouraged to hydrate and have snacks nearby. has passed out. happens several x a week.   Tachycardia    Past Surgical History:  Procedure Laterality Date   ADENOIDECTOMY  2003   age 51   APPENDECTOMY  57   CESAREAN SECTION  2018   2020. 2021   CESAREAN SECTION N/A 05/05/2019   Procedure: REPEAT CESAREAN SECTION;  Surgeon: Linzie Collin, MD;  Location: [facility] ORS;  Service: Obstetrics;  Laterality: N/A;   CESAREAN SECTION WITH BILATERAL TUBAL LIGATION Bilateral 03/25/2020   Procedure: CESAREAN SECTION WITH BILATERAL TUBAL LIGATION;  Surgeon: Michelle Laser, MD;  Location: [facility] ORS;  Service: Obstetrics;  Laterality: Bilateral;  Repeat   CHOLECYSTECTOMY N/A 11/06/2022   Procedure: LAPAROSCOPIC CHOLECYSTECTOMY;  Surgeon: Michelle Ore, MD;  Location: [facility] ORS;  Service: General;  Laterality: N/A;   CORONARY ANGIOPLASTY WITH STENT PLACEMENT  03/2016   2 stents   DILATION AND  CURETTAGE OF UTERUS  2014   EXCISION OF HYDRADENITIS  2009, 2012   REMOVAL OF SWEAT GLANDS   TONSILLECTOMY     2003    Allergies  Allergies  Allergen Reactions   Darvocet [Propoxyphene N-Acetaminophen] Anaphylaxis    Also makes her stomach hurt   Peanut-Containing Drug Products Shortness Of Breath, Swelling and Itching   Penicillins Shortness Of Breath, Swelling and Anaphylaxis    Did it involve swelling of the face/tongue/throat, SOB, or low BP? Yes Did it involve sudden or severe rash/hives, skin peeling, or any reaction on the inside of your mouth or nose? No Did you need to seek medical attention at a hospital or doctor's office? Yes When did it last happen?      2007 If all above answers are "NO", may proceed with cephalosporin use.  Did it involve swelling of the face/tongue/throat, SOB, or low BP? Yes Did it involve sudden or severe rash/hives, skin peeling, or any reaction on the inside of your mouth or nose? No Did you need to seek medical attention at a hospital or doctor's office? Yes When did it last happen?      2007 If all above answers are "NO", may proceed with cephalosporin use.  throat   Tomato Itching   Keflex [Cephalexin] Hives and  Itching   Sulfa Antibiotics Other (See Comments)   Imitrex [Sumatriptan] Nausea And Vomiting   Toradol [Ketorolac Tromethamine] Rash    Red rash with bumps   Tramadol Nausea And Vomiting    History of Present Illness    36 year old female with the above past medical history including CAD, hypertension, hyperlipidemia, diabetes, tobacco abuse, PCOS, obesity, Graves' disease, anxiety.   In April 2017, she suffered a non-STEMI and was treated at [facility] with drug-eluting stent to the LAD and first diagonal.  She has since been followed by Michelle Osborn.  2D echocardiogram in August 2020, showed an EF of 60 to 65% without any significant valvular disease.  In late 2020, she reported episodic syncope and a ZIO monitor was ordered.   This never resulted as it was apparently lost in the mail.  Luci Bank was reordered in January 2021 however, no results are available and patient says she never wore.  [name] has had multiple ER visits over the past year and was most recently seen on October 6 with complaints of abdominal discomfort.  Right upper quadrant ultrasound was notable for cholelithiasis without evidence of choledocholithiasis or acute cholecystitis.  I saw her in clinic on October 17 for preoperative evaluation, she reported significant dyspnea exertion with limited activity as well as intermittent palpitations, and syncopal episodes occurring 2-3 times per month, both at rest and while standing, typically preceded by vasovagal type symptoms (warmth, nausea, diaphoresis).  A Zio monitor was placed and echocardiogram ordered.  She was seen somewhat acutely on October 26 secondary to a complaint of swelling of her hands and face as well as generalized numbness and weakness, that occurred 2 days prior.  At the time of the event, she felt very lightheaded and was seen by EMS and told she was dehydrated (apparently blood pressure in the 80s).  Up to that point, no significant events occurred on her monitor and reassurance was offered.  [name] subsequently underwent echocardiography on November 3, which showed an EF of 60 to 65% without wall motion abnormalities or significant valvular disease.  Though the report is not finalized, event monitoring showed 2 very brief runs of PSVT on October 26 and again on October 28, both resulted in triggered events.  She had multiple other triggered events associated with sinus rhythm/sinus tachycardia.  No sustained arrhythmias or pauses noted.  ***? Syncope***she subsequently underwent laparoscopic cholecystectomy on November 7.  Home Medications    Current Outpatient Medications  Medication Sig Dispense Refill   aspirin EC 81 MG tablet Take 1 tablet (81 mg total) by mouth daily. Swallow whole. 90  tablet 3   escitalopram (LEXAPRO) 5 MG tablet Take 5 mg by mouth at bedtime.     hydrocortisone (ANUSOL-HC) 2.5 % rectal cream Place 1 Application rectally 2 (two) times daily as needed for hemorrhoids or anal itching.     ibuprofen (ADVIL) 200 MG tablet Take 200-400 mg by mouth every 8 (eight) hours as needed (pain.).     linaclotide (LINZESS) 72 MCG capsule Take 72 mcg by mouth at bedtime.     oxyCODONE (ROXICODONE) 5 MG immediate release tablet Take 1 tablet (5 mg total) by mouth every 8 (eight) hours as needed. 20 tablet 0   pantoprazole (PROTONIX) 40 MG tablet Take 40 mg by mouth at bedtime.     QUEtiapine (SEROQUEL) 100 MG tablet Take 300 mg by mouth at bedtime.     rosuvastatin (CRESTOR) 10 MG tablet Take 10 mg by mouth  at bedtime.     sucralfate (CARAFATE) 1 GM/10ML suspension Take 10 mLs (1 g total) by mouth 3 (three) times daily with meals. (Patient taking differently: Take 1 g by mouth 3 (three) times daily as needed (swelling).) 420 mL 0   No current facility-administered medications for this visit.     Review of Systems    ***.  All other systems reviewed and are otherwise negative except as noted above.    Physical Exam    VS:  LMP 10/31/2022 Comment: 10-31-22 Ua preg , BMI There is no height or weight on file to calculate BMI.     GEN: Well nourished, well developed, in no acute distress. HEENT: normal. Neck: Supple, no JVD, carotid bruits, or masses. Cardiac: RRR, no murmurs, rubs, or gallops. No clubbing, cyanosis, edema.  Radials/DP/PT 2+ and equal bilaterally.  Respiratory:  Respirations regular and unlabored, clear to auscultation bilaterally. GI: Soft, nontender, nondistended, BS + x 4. MS: no deformity or atrophy. Skin: warm and dry, no rash. Neuro:  Strength and sensation are intact. Psych: Normal affect.  Accessory Clinical Findings    ECG personally reviewed by me today - *** - no acute changes.  Lab Results  Component Value Date   WBC 7.9 11/05/2022    HGB 12.2 11/05/2022   HCT 38.0 11/05/2022   MCV 85.2 11/05/2022   PLT 285 11/05/2022   Lab Results  Component Value Date   CREATININE 0.70 11/05/2022   BUN 8 11/05/2022   NA 138 11/05/2022   K 4.2 11/05/2022   CL 109 11/05/2022   CO2 25 11/05/2022   Lab Results  Component Value Date   ALT 16 10/04/2022   AST 17 10/04/2022   ALKPHOS 124 10/04/2022   BILITOT 0.2 (L) 10/04/2022   Lab Results  Component Value Date   CHOL 176 11/18/2009   HDL 37 (L) 11/18/2009   LDLCALC 127 (H) 11/18/2009   TRIG 62 11/18/2009   CHOLHDL 4.8 Ratio 11/18/2009    Lab Results  Component Value Date   HGBA1C 4.8 12/17/2019    Assessment & Plan    1.  ***   Nicolasa Ducking, NP 11/14/2022, 8:32 AM

## 2022-11-15 ENCOUNTER — Ambulatory Visit: Payer: Medicaid Other | Admitting: Nurse Practitioner

## 2022-11-15 NOTE — Patient Instructions (Incomplete)
Thyroid-Stimulating Hormone Test Why am I having this test? The thyroid is a gland in the lower front of the neck. It makes hormones that affect many body parts and systems, including the system that affects how quickly the body burns fuel for energy (metabolism). The pituitary gland is located just below the brain, behind the eyes and nasal passages. It helps maintain thyroid hormone levels and thyroid gland function. You may have a thyroid-stimulating hormone (TSH) test if you have possible symptoms of abnormal thyroid hormone levels. This test can help your health care provider: Diagnose a disorder of the thyroid gland or pituitary gland. Manage your condition and treatment if you have an underactive thyroid (hypothyroidism) or an overactive thyroid (hyperthyroidism). Newborn babies may have this test done to screen for hypothyroidism that is present at birth (congenital). What is being tested? This test measures the amount of TSH in your blood. TSH may also be called thyrotropin. When the thyroid does not make enough hormones, the pituitary gland releases TSH into the bloodstream to stimulate the thyroid gland to make more hormones. What kind of sample is taken?     A blood sample is required for this test. It is usually collected by inserting a needle into a blood vessel. For newborns, a small amount of blood may be collected from the umbilical cord, or by using a small needle to prick the baby's heel (heel stick). Tell a health care provider about: All medicines you are taking, including vitamins, herbs, eye drops, creams, and over-the-counter medicines. Any bleeding problems you have. Any surgeries you have had. Any medical conditions you have. Whether you are pregnant or may be pregnant. How are the results reported? Your test results will be reported as a value that indicates how much TSH is in your blood. Your health care provider will compare your results to normal ranges that were  established after testing a large group of people (reference ranges). Reference ranges may vary among labs and hospitals. For this test, common reference ranges are: Adult: 2-10 microunits/mL or 2-10 milliunits/L. Newborn: Heel stick: 3-18 microunits/mL or 3-18 milliunits/L. Umbilical cord: 3-12 microunits/mL or 3-12 milliunits/L. What do the results mean? Results that are within the reference range are considered normal. This means that you have a normal amount of TSH in your blood. Results that are higher than the reference range mean that your TSH levels are too high. This may mean: Your thyroid gland is not making enough thyroid hormones. Your thyroid medicine dosage is too low. You have a tumor on your pituitary gland. This is rare. Results that are lower than the reference range mean that your TSH levels are too low. This may be caused by hyperthyroidism or by a problem with the pituitary gland function. Talk with your health care provider about what your results mean. Questions to ask your health care provider Ask your health care provider, or the department that is doing the test: When will my results be ready? How will I get my results? What are my treatment options? What other tests do I need? What are my next steps? Summary You may have a thyroid-stimulating hormone (TSH) test if you have possible symptoms of abnormal thyroid hormone levels. The thyroid is a gland in the lower front of the neck. It makes hormones that affect many body parts and systems. The pituitary gland is located just below the brain, behind the eyes and nasal passages. It helps maintain thyroid hormone levels and thyroid gland function. This test   measures the amount of TSH in your blood. TSH is made by the pituitary gland. It may also be called thyrotropin. This information is not intended to replace advice given to you by your health care provider. Make sure you discuss any questions you have with your  health care provider. Document Revised: 12/19/2021 Document Reviewed: 12/19/2021 Elsevier Patient Education  2023 [employer]  

## 2022-12-06 ENCOUNTER — Telehealth: Payer: Medicaid Other | Admitting: Nurse Practitioner

## 2022-12-06 DIAGNOSIS — L732 Hidradenitis suppurativa: Secondary | ICD-10-CM | POA: Diagnosis not present

## 2022-12-06 DIAGNOSIS — T7840XD Allergy, unspecified, subsequent encounter: Secondary | ICD-10-CM | POA: Diagnosis not present

## 2022-12-06 MED ORDER — PREDNISONE 20 MG PO TABS: 20.0000 mg | ORAL_TABLET | Freq: Every day | ORAL | 0 refills | Status: AC

## 2022-12-06 MED ORDER — CLINDAMYCIN PHOSPHATE 1 % EX SOLN: Freq: Two times a day (BID) | CUTANEOUS | 0 refills | Status: AC

## 2022-12-06 NOTE — Progress Notes (Signed)
Virtual Visit Consent   Michelle Osborn, you are scheduled for a virtual visit with a [facility] provider today. Just as with appointments in the office, your consent must be obtained to participate. Your consent will be active for this visit and any virtual visit you may have with one of our providers in the next 365 days. If you have a MyChart account, a copy of this consent can be sent to you electronically.  As this is a virtual visit, video technology does not allow for your provider to perform a traditional examination. This may limit your provider's ability to fully assess your condition. If your provider identifies any concerns that need to be evaluated in person or the need to arrange testing (such as labs, EKG, etc.), we will make arrangements to do so. Although advances in technology are sophisticated, we cannot ensure that it will always work on either your end or our end. If the connection with a video visit is poor, the visit may have to be switched to a telephone visit. With either a video or telephone visit, we are not always able to ensure that we have a secure connection.  By engaging in this virtual visit, you consent to the provision of healthcare and authorize for your insurance to be billed (if applicable) for the services provided during this visit. Depending on your insurance coverage, you may receive a charge related to this service.  I need to obtain your verbal consent now. Are you willing to proceed with your visit today? Michelle Osborn has provided verbal consent on 12/06/2022 for a virtual visit (video or telephone). Michelle Rigg, NP  Date: 12/06/2022 5:26 PM  Virtual Visit via Video Note   I, Michelle Osborn, connected with  Michelle Osborn  ([id number], [birth year]) on 12/06/22 at  5:30 PM EST by a video-enabled telemedicine application and verified that I am speaking with the correct person using two identifiers.  Location: Patient: Virtual Visit Location Patient:  Home Provider: Virtual Visit Location Provider: Home Office   I discussed the limitations of evaluation and management by telemedicine and the availability of in person appointments. The patient expressed understanding and agreed to proceed.    History of Present Illness: Michelle Osborn is a 36 y.o. who identifies as a female who was assigned female at birth, and is being seen today for Allergic reaction.  Ms. Greggs was started on Doxycycline and Fluconazole 2 days ago. She had been experiencing a breakout of hidradenitis suppurativa. Yesterday she began to notice a rash spreading on her arms and in her groin folds after starting both medications. She reports taking fluconazole in the past with no complications but unsure if she has taken doxy before. Will stop doxy at this time and start topical clinda. Continue fluconazole and will give prednisone for widespread rash   Problems:  Patient Active Problem List   Diagnosis Date Noted   Postpartum anemia 03/27/2020   Keloid scar of skin 03/25/2020   S/P cesarean section 03/25/2020   Chronic hypertension in pregnancy 12/17/2019   UTI (urinary tract infection) during pregnancy 09/14/2019   Back pain affecting pregnancy in third trimester 09/14/2019   Low TSH level 09/09/2019   Chest pain 08/30/2019   History of pre-eclampsia 05/01/2019   Supervision of high risk pregnancy, antepartum 01/16/2019   Diabetes mellitus complicating pregnancy 01/16/2019   Atherosclerosis of native coronary artery with stable angina pectoris (HCC) 01/09/2019   Tachycardia 01/09/2019   Mixed hyperlipidemia 01/09/2019   Hyperthyroidism affecting  pregnancy in second trimester 01/07/2019   Lead exposure 12/06/2018   Smoker 12/06/2018   Bipolar 2 disorder ([place]) 12/06/2018   History of anxiety 12/06/2018   Mild intermittent asthma without complication [id number]   Type 2 diabetes mellitus without complication, without long-term current use of insulin ([place]) 12/06/2018    High-risk pregnancy in second trimester 12/06/2018   History of heart attack 12/06/2018   Graves disease 10/30/2018   History of cesarean section 10/30/2018   Nausea/vomiting in pregnancy 10/30/2018   History of marijuana use 10/30/2018   HYPERCHOLESTEROLEMIA 12/08/2009   HIDRADENITIS SUPPURATIVA 12/08/2009   VITAMIN D DEFICIENCY 11/18/2009   GERD 11/18/2009   MENORRHAGIA 11/18/2009   Diabetes mellitus type 2 with complications ([place]) [id number]   Essential hypertension 11/17/2009   ASTHMA 11/17/2009    Allergies:  Allergies  Allergen Reactions   Darvocet [Propoxyphene N-Acetaminophen] Anaphylaxis    Also makes her stomach hurt   Peanut-Containing Drug Products Shortness Of Breath, Swelling and Itching   Penicillins Shortness Of Breath, Swelling and Anaphylaxis    Did it involve swelling of the face/tongue/throat, SOB, or low BP? Yes Did it involve sudden or severe rash/hives, skin peeling, or any reaction on the inside of your mouth or nose? No Did you need to seek medical attention at a hospital or doctor's office? Yes When did it last happen?      2007 If all above answers are "NO", may proceed with cephalosporin use.  Did it involve swelling of the face/tongue/throat, SOB, or low BP? Yes Did it involve sudden or severe rash/hives, skin peeling, or any reaction on the inside of your mouth or nose? No Did you need to seek medical attention at a hospital or doctor's office? Yes When did it last happen?      2007 If all above answers are "NO", may proceed with cephalosporin use.  throat   Tomato Itching   Keflex [Cephalexin] Hives and Itching   Sulfa Antibiotics Other (See Comments)   Imitrex [Sumatriptan] Nausea And Vomiting   Toradol [Ketorolac Tromethamine] Rash    Red rash with bumps   Tramadol Nausea And Vomiting   Medications:  Current Outpatient Medications:    clindamycin (CLEOCIN T) 1 % external solution, Apply topically 2 (two) times daily. STOP DOXYCYCLINE, Disp:  60 mL, Rfl: 0   predniSONE (DELTASONE) 20 MG tablet, Take 1 tablet (20 mg total) by mouth daily with breakfast for 5 days., Disp: 5 tablet, Rfl: 0   aspirin EC 81 MG tablet, Take 1 tablet (81 mg total) by mouth daily. Swallow whole., Disp: 90 tablet, Rfl: 3   escitalopram (LEXAPRO) 5 MG tablet, Take 5 mg by mouth at bedtime., Disp: , Rfl:    hydrocortisone (ANUSOL-HC) 2.5 % rectal cream, Place 1 Application rectally 2 (two) times daily as needed for hemorrhoids or anal itching., Disp: , Rfl:    ibuprofen (ADVIL) 200 MG tablet, Take 200-400 mg by mouth every 8 (eight) hours as needed (pain.)., Disp: , Rfl:    linaclotide (LINZESS) 72 MCG capsule, Take 72 mcg by mouth at bedtime., Disp: , Rfl:    oxyCODONE (ROXICODONE) 5 MG immediate release tablet, Take 1 tablet (5 mg total) by mouth every 8 (eight) hours as needed., Disp: 20 tablet, Rfl: 0   pantoprazole (PROTONIX) 40 MG tablet, Take 40 mg by mouth at bedtime., Disp: , Rfl:    QUEtiapine (SEROQUEL) 100 MG tablet, Take 300 mg by mouth at bedtime., Disp: , Rfl:    rosuvastatin (CRESTOR) 10  MG tablet, Take 10 mg by mouth at bedtime., Disp: , Rfl:    sucralfate (CARAFATE) 1 GM/10ML suspension, Take 10 mLs (1 g total) by mouth 3 (three) times daily with meals. (Patient taking differently: Take 1 g by mouth 3 (three) times daily as needed (swelling).), Disp: 420 mL, Rfl: 0  Observations/Objective: Patient is well-developed, well-nourished in no acute distress.  Resting comfortably  at home.  Head is normocephalic, atraumatic.  No labored breathing.  Speech is clear and coherent with logical content.  Patient is alert and oriented at baseline.    Assessment and Plan: 1. Hidradenitis suppurativa - clindamycin (CLEOCIN T) 1 % external solution; Apply topically 2 (two) times daily. STOP DOXYCYCLINE  Dispense: 60 mL; Refill: 0  2. Allergic reaction, subsequent encounter - predniSONE (DELTASONE) 20 MG tablet; Take 1 tablet (20 mg total) by mouth daily  with breakfast for 5 days.  Dispense: 5 tablet; Refill: 0   Follow Up Instructions: I discussed the assessment and treatment plan with the patient. The patient was provided an opportunity to ask questions and all were answered. The patient agreed with the plan and demonstrated an understanding of the instructions.  A copy of instructions were sent to the patient via MyChart unless otherwise noted below.     The patient was advised to call back or seek an in-person evaluation if the symptoms worsen or if the condition fails to improve as anticipated.  Time:  I spent 12 minutes with the patient via telehealth technology discussing the above problems/concerns.    Gildardo Pounds, NP

## 2022-12-06 NOTE — Patient Instructions (Signed)
Michelle Osborn, thank you for joining [name], NP for today's virtual visit.  While this provider is not your primary care provider (PCP), if your PCP is located in our provider database this encounter information will be shared with them immediately following your visit.   A [facility] MyChart account gives you access to today's visit and all your visits, tests, and labs performed at [facility] " click here if you don't have a [facility] MyChart account or go to [email or web address]  Consent: (Patient) Michelle Osborn provided verbal consent for this virtual visit at the beginning of the encounter.  Current Medications:  Current Outpatient Medications:    clindamycin (CLEOCIN T) 1 % external solution, Apply topically 2 (two) times daily. STOP DOXYCYCLINE, Disp: 60 mL, Rfl: 0   predniSONE (DELTASONE) 20 MG tablet, Take 1 tablet (20 mg total) by mouth daily with breakfast for 5 days., Disp: 5 tablet, Rfl: 0   aspirin EC 81 MG tablet, Take 1 tablet (81 mg total) by mouth daily. Swallow whole., Disp: 90 tablet, Rfl: 3   escitalopram (LEXAPRO) 5 MG tablet, Take 5 mg by mouth at bedtime., Disp: , Rfl:    hydrocortisone (ANUSOL-HC) 2.5 % rectal cream, Place 1 Application rectally 2 (two) times daily as needed for hemorrhoids or anal itching., Disp: , Rfl:    ibuprofen (ADVIL) 200 MG tablet, Take 200-400 mg by mouth every 8 (eight) hours as needed (pain.)., Disp: , Rfl:    linaclotide (LINZESS) 72 MCG capsule, Take 72 mcg by mouth at bedtime., Disp: , Rfl:    oxyCODONE (ROXICODONE) 5 MG immediate release tablet, Take 1 tablet (5 mg total) by mouth every 8 (eight) hours as needed., Disp: 20 tablet, Rfl: 0   pantoprazole (PROTONIX) 40 MG tablet, Take 40 mg by mouth at bedtime., Disp: , Rfl:    QUEtiapine (SEROQUEL) 100 MG tablet, Take 300 mg by mouth at bedtime., Disp: , Rfl:    rosuvastatin (CRESTOR) 10 MG tablet, Take 10 mg by mouth at bedtime., Disp: , Rfl:    sucralfate  (CARAFATE) 1 GM/10ML suspension, Take 10 mLs (1 g total) by mouth 3 (three) times daily with meals. (Patient taking differently: Take 1 g by mouth 3 (three) times daily as needed (swelling).), Disp: 420 mL, Rfl: 0   Medications ordered in this encounter:  Meds ordered this encounter  Medications   clindamycin (CLEOCIN T) 1 % external solution    Sig: Apply topically 2 (two) times daily. STOP DOXYCYCLINE    Dispense:  60 mL    Refill:  0    Order Specific Question:   Supervising Provider    Answer:   Merrilee Jansky [[id number]]   predniSONE (DELTASONE) 20 MG tablet    Sig: Take 1 tablet (20 mg total) by mouth daily with breakfast for 5 days.    Dispense:  5 tablet    Refill:  0    Order Specific Question:   Supervising Provider    Answer:   Merrilee Jansky [id number]     *If you need refills on other medications prior to your next appointment, please contact your pharmacy*  Follow-Up: Call back or seek an in-person evaluation if the symptoms worsen or if the condition fails to improve as anticipated.  [facility] [phone]   If you have been instructed to have an in-person evaluation today at a local Urgent Care facility, please use the link below. It will take you to a list of  all of our available [facility], including address, phone number and hours of operation. Please do not delay care.  [facility]  If you or a family member do not have a primary care provider, use the link below to schedule a visit and establish care. When you choose a [facility] primary care physician or advanced practice provider, you gain a long-term partner in health. Find a Primary Care Provider  Learn more about [facility]'s in-office and virtual care options: [facility]

## 2023-01-18 ENCOUNTER — Telehealth: Payer: Self-pay

## 2023-01-18 NOTE — Telephone Encounter (Signed)
Dianna Rossetti, NP at [facility] would like a call regarding patient.  Would like for patient to be seen for follow up for her graves disease.  [phone]

## 2023-01-21 NOTE — Telephone Encounter (Signed)
Left message on cell phone voice mail to call office to schedule appointment.

## 2023-01-28 ENCOUNTER — Ambulatory Visit: Payer: Medicaid Other | Admitting: Internal Medicine

## 2023-01-28 NOTE — Progress Notes (Deleted)
Name: Michelle Osborn  MRN/ DOB: [id number], 04/02/1986    Age/ Sex: 37 y.o., female    PCP: Pcp, No   Reason for Endocrinology Evaluation: hyperthyroidism     Date of Initial Endocrinology Evaluation: 01/28/2023     HPI: [name] is a 37 y.o. female with a past medical history of hyperthyroidism since 2017. The patient presented for initial endocrinology clinic visit on 01/28/2023 for consultative assistance with her Low TSH     Pt was diagnosed with hyperthyroidism Secondary to graves' disease (per pt) in 2017 when she was pregnant with her first child, (was living in [place]) she was put on PTU for approximately 6 months during that pregnancy and the pt was lost to follow up after her delivery in 10/2017.  She became pregnant again in 2019 with her second child, went on PTU again for most of that pregnancy , pt again was lost to follow up after her delivery in 05/2019.   Pt is currently pregnant at 10.6 weeks, LMP 06/25/2019.   She presented to the ED 08/2019 with chest pains , during evaluation she was noted to have a suppressed  TSH <0.01 uIU/mL and elevated FT4 2.47 ng/dL    Currently living at a [facility] shelter     Today is c/o weight loss, has occasional diarrhea,and  tremors.    Denies diarrhea , anxiety or jittery sensation  She has chronic enlarged neck with occasional dysphagia   Mother, aunt with thyroid disease  No biotin intake.      HISTORY:  Past Medical History:  Past Medical History:  Diagnosis Date   Anemia    Anxiety    Asthma    "grew out' not since teenager   Bipolar 1 disorder ([place])    CAD (coronary artery disease)    a. 03/2016 NSTEMI/PCI: DES to LAD and D1.   Depression    Diabetes mellitus    not currently on meds, normal A1c   Discoloration of skin of foot 02/2020   pt wakes up with purple feet every am. it dissipates as day goes on.   Graves disease    History of echocardiogram    a. 08/2019 Echo: EF 60-65%, no signif valvular dzs;  b. 10/2022 Echo: EF 60-65%, no rwma, nl RV fxn, no significant valvular disease.   Hydradenitis    Hyperlipidemia LDL goal <70    Hypertension    Hyperthyroidism    MI (myocardial infarction) ([place]) 2017   Neuropathy    Syncope 02/2020   encouraged to hydrate and have snacks nearby. has passed out. happens several x a week.   Tachycardia    Past Surgical History:  Past Surgical History:  Procedure Laterality Date   ADENOIDECTOMY  2003   age 75   [place]  2018   2020. 2021   CESAREAN SECTION N/A 05/05/2019   Procedure: REPEAT CESAREAN SECTION;  Surgeon: Harlin Heys, MD;  Location: [facility] ORS;  Service: Obstetrics;  Laterality: N/A;   CESAREAN SECTION WITH BILATERAL TUBAL LIGATION Bilateral 03/25/2020   Procedure: CESAREAN SECTION WITH BILATERAL TUBAL LIGATION;  Surgeon: Rubie Maid, MD;  Location: [facility] ORS;  Service: Obstetrics;  Laterality: Bilateral;  Repeat   CHOLECYSTECTOMY N/A 11/06/2022   Procedure: LAPAROSCOPIC CHOLECYSTECTOMY;  Surgeon: Felicie Morn, MD;  Location: [facility];  Service: General;  Laterality: N/A;   CORONARY ANGIOPLASTY WITH STENT PLACEMENT  03/2016   2 stents   DILATION AND CURETTAGE OF  UTERUS  2014   EXCISION OF HYDRADENITIS  2009, 2012   REMOVAL OF SWEAT GLANDS   TONSILLECTOMY     2003    Social History:  reports that she has been smoking cigarettes. She has a 2.50 pack-year smoking history. She has never used smokeless tobacco. She reports that she does not currently use drugs after having used the following drugs: Marijuana. She reports that she does not drink alcohol. Family History: family history includes Asthma in her mother; Cancer in her maternal aunt and maternal grandmother; Diabetes in her mother; Heart disease in her father and mother; Heart failure in her mother; Hyperlipidemia in her mother; Hypertension in her father and mother; Kidney disease in her father.   HOME MEDICATIONS: Allergies as of  01/28/2023       Reactions   Darvocet [propoxyphene N-acetaminophen] Anaphylaxis   Also makes her stomach hurt   Peanut-containing Drug Products Shortness Of Breath, Swelling, Itching   Penicillins Shortness Of Breath, Swelling, Anaphylaxis   Did it involve swelling of the face/tongue/throat, SOB, or low BP? Yes Did it involve sudden or severe rash/hives, skin peeling, or any reaction on the inside of your mouth or nose? No Did you need to seek medical attention at a hospital or doctor's office? Yes When did it last happen?      2007 If all above answers are "NO", may proceed with cephalosporin use. Did it involve swelling of the face/tongue/throat, SOB, or low BP? Yes Did it involve sudden or severe rash/hives, skin peeling, or any reaction on the inside of your mouth or nose? No Did you need to seek medical attention at a hospital or doctor's office? Yes When did it last happen?      2007 If all above answers are "NO", may proceed with cephalosporin use. throat   Tomato Itching   Keflex [cephalexin] Hives, Itching   Sulfa Antibiotics Other (See Comments)   Imitrex [sumatriptan] Nausea And Vomiting   Toradol [ketorolac Tromethamine] Rash   Red rash with bumps   Tramadol Nausea And Vomiting        Medication List        Accurate as of January 28, 2023  7:35 AM. If you have any questions, ask your nurse or doctor.          aspirin EC 81 MG tablet Take 1 tablet (81 mg total) by mouth daily. Swallow whole.   clindamycin 1 % external solution Commonly known as: CLEOCIN T Apply topically 2 (two) times daily. STOP DOXYCYCLINE   escitalopram 5 MG tablet Commonly known as: LEXAPRO Take 5 mg by mouth at bedtime.   hydrocortisone 2.5 % rectal cream Commonly known as: ANUSOL-HC Place 1 Application rectally 2 (two) times daily as needed for hemorrhoids or anal itching.   ibuprofen 200 MG tablet Commonly known as: ADVIL Take 200-400 mg by mouth every 8 (eight) hours as needed  (pain.).   Linzess 72 MCG capsule Generic drug: linaclotide Take 72 mcg by mouth at bedtime.   oxyCODONE 5 MG immediate release tablet Commonly known as: Roxicodone Take 1 tablet (5 mg total) by mouth every 8 (eight) hours as needed.   pantoprazole 40 MG tablet Commonly known as: PROTONIX Take 40 mg by mouth at bedtime.   QUEtiapine 100 MG tablet Commonly known as: SEROQUEL Take 300 mg by mouth at bedtime.   rosuvastatin 10 MG tablet Commonly known as: CRESTOR Take 10 mg by mouth at bedtime.   sucralfate 1 GM/10ML suspension Commonly known  as: Carafate Take 10 mLs (1 g total) by mouth 3 (three) times daily with meals. What changed:  when to take this reasons to take this          REVIEW OF SYSTEMS: A comprehensive ROS was conducted with the patient and is negative except as per HPI and below:  Review of Systems  Cardiovascular:  Negative for chest pain.  Gastrointestinal:  Positive for nausea and vomiting.  Neurological:  Negative for tingling and tremors.  Psychiatric/Behavioral:  Negative for depression. The patient is not nervous/anxious.        OBJECTIVE:  VS: There were no vitals taken for this visit.   Wt Readings from Last 3 Encounters:  11/06/22 210 lb 1.6 oz (95.3 kg)  11/05/22 210 lb (95.3 kg)  10/25/22 217 lb (98.4 kg)     EXAM: General: Pt appears well and is in NAD  Hydration: Well-hydrated with moist mucous membranes and good skin turgor  Eyes: External eye exam normal without stare, lid lag or exophthalmos.  EOM intact.    Ears, Nose, Throat: Hearing: Grossly intact bilaterally Dental: Good dentition  Throat: Clear without mass, erythema or exudate  Neck: General: Supple without adenopathy. Thyroid: Thyroid size enlarged .  No nodules appreciated. No thyroid bruit.  Lungs: Clear with good BS bilat with no rales, rhonchi, or wheezes  Heart: Auscultation: RRR.  Abdomen: Normoactive bowel sounds, soft, nontender, without masses or  organomegaly palpable  Extremities:  BL LE: No pretibial edema normal ROM and strength.  Skin: Hair: Texture and amount normal with gender appropriate distribution Skin Inspection: No rashes Skin Palpation: Skin temperature, texture, and thickness normal to palpation  Neuro: Cranial nerves: II - XII grossly intact  Motor: Normal strength throughout DTRs: 2+ and symmetric in UE without delay in relaxation phase  Mental Status: Judgment, insight: Intact Orientation: Oriented to time, place, and person Mood and affect: No depression, anxiety, or agitation     DATA REVIEWED: Results for XANA, NOWLEN (MRN [id number]) as of 09/10/2019 13:15  Ref. Range 08/30/2019 10:41 09/09/2019 14:12  TSH Latest Ref Range: 0.35 - 4.50 uIU/mL <0.010 (L) <0.01 (L)  T4,Free(Direct) Latest Ref Range: 0.60 - 1.60 ng/dL 2.47 (H) 2.00 (H)  Thyroxine (T4) Latest Ref Range: 5.1 - 11.9 mcg/dL  15.4 (H)    Results for SUNSHINE, ZIPF (MRN [id number]) as of 09/09/2019 13:30  Ref. Range 08/29/2019 19:29  WBC Latest Ref Range: 4.0 - 10.5 K/uL 9.4  RBC Latest Ref Range: 3.87 - 5.11 MIL/uL 4.39  Hemoglobin Latest Ref Range: 12.0 - 15.0 g/dL 11.5 (L)  HCT Latest Ref Range: 36.0 - 46.0 % 34.6 (L)  MCV Latest Ref Range: 80.0 - 100.0 fL 78.8 (L)  MCH Latest Ref Range: 26.0 - 34.0 pg 26.2  MCHC Latest Ref Range: 30.0 - 36.0 g/dL 33.2  RDW Latest Ref Range: 11.5 - 15.5 % 14.0  Platelets Latest Ref Range: 150 - 400 K/uL 250  nRBC Latest Ref Range: 0.0 - 0.2 % 0.0  Neutrophils Latest Units: % 59  Lymphocytes Latest Units: % 35  Monocytes Relative Latest Units: % 6  Eosinophil Latest Units: % 0  Basophil Latest Units: % 0  Immature Granulocytes Latest Units: % 0  NEUT# Latest Ref Range: 1.7 - 7.7 K/uL 5.5  Lymphocyte # Latest Ref Range: 0.7 - 4.0 K/uL 3.3  Monocyte # Latest Ref Range: 0.1 - 1.0 K/uL 0.6  Eosinophils Absolute Latest Ref Range: 0.0 - 0.5 K/uL 0.0  Basophils Absolute Latest Ref  Range: 0.0 - 0.1 K/uL 0.0  Abs  Immature Granulocytes Latest Ref Range: 0.00 - 0.07 K/uL 0.02   Results for AARIAH, ESPITIA (MRN [id number]) as of 09/09/2019 13:30  Ref. Range 08/30/2019 04:57  Sodium Latest Ref Range: 135 - 145 mmol/L 136  Potassium Latest Ref Range: 3.5 - 5.1 mmol/L 3.8  Chloride Latest Ref Range: 98 - 111 mmol/L 110  CO2 Latest Ref Range: 22 - 32 mmol/L 20 (L)  Glucose Latest Ref Range: 70 - 99 mg/dL 96  Mean Plasma Glucose Latest Units: mg/dL 91.06  BUN Latest Ref Range: 6 - 20 mg/dL 5 (L)  Creatinine Latest Ref Range: 0.44 - 1.00 mg/dL 0.42 (L)  Calcium Latest Ref Range: 8.9 - 10.3 mg/dL 8.7 (L)  Anion gap Latest Ref Range: 5 - 15  6  Alkaline Phosphatase Latest Ref Range: 38 - 126 U/L 84  Albumin Latest Ref Range: 3.5 - 5.0 g/dL 3.1 (L)  AST Latest Ref Range: 15 - 41 U/L 19  ALT Latest Ref Range: 0 - 44 U/L 23  Total Protein Latest Ref Range: 6.5 - 8.1 g/dL 5.8 (L)  Total Bilirubin Latest Ref Range: 0.3 - 1.2 mg/dL 0.6  GFR, Est Non African American Latest Ref Range: >60 mL/min >60  GFR, Est African American Latest Ref Range: >60 mL/min >60   ASSESSMENT/PLAN/RECOMMENDATIONS:   Hyperthyroidism:   - Per pt she has been diagnosed with graves' disease since 2017 during her first pregnancy, pt only seeks medical care during pregnancies.  - We did discussed the dangers of uncontrolled hyperthyroidism in causing cardiac arrhythmia in addition to fatigue.  - We also discussed permissive hyperthyroidism during pregnancy, we discussed PTU as the preferred thionamide therapy if needed during the first trimester, and methimazole for the 2nd and 3rd pregnancy.  - We also discussed the higher risk of hepatic failure with PTU.  - Discussed the importance of regular checkups and blood work especially during pregnancy.  - Given her lab results today, with decrease in FT4 and Total T4 not at goal for treatment yet, will hold off on initiating thionamide therapy at this time     Labs in 4 weeks  F/U in 2  months   Signed electronically by: Mack Guise, MD  [facility] Endocrinology  [facility] [address] [place], [place] [address] Phone: [phone] FAX: [phone]   CC: Pcp, No No address on file Phone: None Fax: None   Return to Endocrinology clinic as below: Future Appointments  Date Time Provider [place]  01/28/2023 12:10 PM Brandom Kerwin, Melanie Crazier, MD [facility] None

## 2023-01-31 ENCOUNTER — Ambulatory Visit: Payer: Medicaid Other | Admitting: Internal Medicine

## 2023-01-31 NOTE — Progress Notes (Deleted)
Name: Michelle Osborn  MRN/ DOB: [id number], 03/18/1986    Age/ Sex: 37 y.o., female    PCP: Pcp, No   Reason for Endocrinology Evaluation: hyperthyroidism     Date of Initial Endocrinology Evaluation: 01/31/2023     HPI: Michelle Osborn is a 37 y.o. female with a past medical history of hyperthyroidism since 2017. The patient presented for initial endocrinology clinic visit on 01/31/2023 for consultative assistance with her Low TSH     Pt was diagnosed with hyperthyroidism Secondary to graves' disease (per pt) in 2017 when she was pregnant with her first child, (was living in [place]) she was put on PTU for approximately 6 months during that pregnancy and the pt was lost to follow up after her delivery in 10/2017.  She became pregnant again in 2019 with her second child, went on PTU again for most of that pregnancy , pt again was lost to follow up after her delivery in 05/2019.    I saw her once on 09/09/2019 when she was pregnant at the time, she was lost to follow-up until her return to/12/2022   Mother, aunt with thyroid disease  No biotin intake.    SUBJECTIVE:    Today (01/31/23):  Ms. [name] is here for a follow up on hyperthyroidism.  The patient has not been to our clinic in 4 years.    Her last TSH was low at 0.107 u IU/mL May 2023 with an elevated free T41.22 NG/DL    HISTORY:  Past Medical History:  Past Medical History:  Diagnosis Date   Anemia    Anxiety    Asthma    "grew out' not since teenager   Bipolar 1 disorder ([place])    CAD (coronary artery disease)    a. 03/2016 NSTEMI/PCI: DES to LAD and D1.   Depression    Diabetes mellitus    not currently on meds, normal A1c   Discoloration of skin of foot 02/2020   pt wakes up with purple feet every am. it dissipates as day goes on.   Graves disease    History of echocardiogram    a. 08/2019 Echo: EF 60-65%, no signif valvular dzs; b. 10/2022 Echo: EF 60-65%, no rwma, nl RV fxn, no significant valvular disease.    Hydradenitis    Hyperlipidemia LDL goal <70    Hypertension    Hyperthyroidism    MI (myocardial infarction) ([place]) 2017   Neuropathy    Syncope 02/2020   encouraged to hydrate and have snacks nearby. has passed out. happens several x a week.   Tachycardia    Past Surgical History:  Past Surgical History:  Procedure Laterality Date   ADENOIDECTOMY  2003   age 35   [place]  2018   2020. 2021   CESAREAN SECTION N/A 05/05/2019   Procedure: REPEAT CESAREAN SECTION;  Surgeon: Harlin Heys, MD;  Location: [facility] ORS;  Service: Obstetrics;  Laterality: N/A;   CESAREAN SECTION WITH BILATERAL TUBAL LIGATION Bilateral 03/25/2020   Procedure: CESAREAN SECTION WITH BILATERAL TUBAL LIGATION;  Surgeon: Rubie Maid, MD;  Location: [facility] ORS;  Service: Obstetrics;  Laterality: Bilateral;  Repeat   CHOLECYSTECTOMY N/A 11/06/2022   Procedure: LAPAROSCOPIC CHOLECYSTECTOMY;  Surgeon: Felicie Morn, MD;  Location: [facility];  Service: General;  Laterality: N/A;   CORONARY ANGIOPLASTY WITH STENT PLACEMENT  03/2016   2 stents   DILATION AND CURETTAGE OF UTERUS  2014   EXCISION OF HYDRADENITIS  2009, 2012   REMOVAL OF SWEAT GLANDS   TONSILLECTOMY     2003    Social History:  reports that she has been smoking cigarettes. She has a 2.50 pack-year smoking history. She has never used smokeless tobacco. She reports that she does not currently use drugs after having used the following drugs: Marijuana. She reports that she does not drink alcohol. Family History: family history includes Asthma in her mother; Cancer in her maternal aunt and maternal grandmother; Diabetes in her mother; Heart disease in her father and mother; Heart failure in her mother; Hyperlipidemia in her mother; Hypertension in her father and mother; Kidney disease in her father.   HOME MEDICATIONS: Allergies as of 01/31/2023       Reactions   Darvocet [propoxyphene N-acetaminophen] Anaphylaxis   Also  makes her stomach hurt   Peanut-containing Drug Products Shortness Of Breath, Swelling, Itching   Penicillins Shortness Of Breath, Swelling, Anaphylaxis   Did it involve swelling of the face/tongue/throat, SOB, or low BP? Yes Did it involve sudden or severe rash/hives, skin peeling, or any reaction on the inside of your mouth or nose? No Did you need to seek medical attention at a hospital or doctor's office? Yes When did it last happen?      2007 If all above answers are "NO", may proceed with cephalosporin use. Did it involve swelling of the face/tongue/throat, SOB, or low BP? Yes Did it involve sudden or severe rash/hives, skin peeling, or any reaction on the inside of your mouth or nose? No Did you need to seek medical attention at a hospital or doctor's office? Yes When did it last happen?      2007 If all above answers are "NO", may proceed with cephalosporin use. throat   Tomato Itching   Keflex [cephalexin] Hives, Itching   Sulfa Antibiotics Other (See Comments)   Imitrex [sumatriptan] Nausea And Vomiting   Toradol [ketorolac Tromethamine] Rash   Red rash with bumps   Tramadol Nausea And Vomiting        Medication List        Accurate as of January 31, 2023  7:15 AM. If you have any questions, ask your nurse or doctor.          aspirin EC 81 MG tablet Take 1 tablet (81 mg total) by mouth daily. Swallow whole.   clindamycin 1 % external solution Commonly known as: CLEOCIN T Apply topically 2 (two) times daily. STOP DOXYCYCLINE   escitalopram 5 MG tablet Commonly known as: LEXAPRO Take 5 mg by mouth at bedtime.   hydrocortisone 2.5 % rectal cream Commonly known as: ANUSOL-HC Place 1 Application rectally 2 (two) times daily as needed for hemorrhoids or anal itching.   ibuprofen 200 MG tablet Commonly known as: ADVIL Take 200-400 mg by mouth every 8 (eight) hours as needed (pain.).   Linzess 72 MCG capsule Generic drug: linaclotide Take 72 mcg by mouth at  bedtime.   oxyCODONE 5 MG immediate release tablet Commonly known as: Roxicodone Take 1 tablet (5 mg total) by mouth every 8 (eight) hours as needed.   pantoprazole 40 MG tablet Commonly known as: PROTONIX Take 40 mg by mouth at bedtime.   QUEtiapine 100 MG tablet Commonly known as: SEROQUEL Take 300 mg by mouth at bedtime.   rosuvastatin 10 MG tablet Commonly known as: CRESTOR Take 10 mg by mouth at bedtime.   sucralfate 1 GM/10ML suspension Commonly known as: Carafate Take 10 mLs (1 g total) by  mouth 3 (three) times daily with meals. What changed:  when to take this reasons to take this          REVIEW OF SYSTEMS: A comprehensive ROS was conducted with the patient and is negative except as per HPI    OBJECTIVE:  VS: There were no vitals taken for this visit.   Wt Readings from Last 3 Encounters:  11/06/22 210 lb 1.6 oz (95.3 kg)  11/05/22 210 lb (95.3 kg)  10/25/22 217 lb (98.4 kg)     EXAM: General: Pt appears well and is in NAD  Eyes: External eye exam normal without stare, lid lag or exophthalmos.  EOM intact.    Neck: General: Supple without adenopathy. Thyroid: Thyroid size enlarged .  No nodules appreciated. No thyroid bruit.  Lungs: Clear with good BS bilat with no rales, rhonchi, or wheezes  Heart: Auscultation: RRR.  Abdomen: Normoactive bowel sounds, soft, nontender, without masses or organomegaly palpable  Extremities:  BL LE: No pretibial edema normal ROM and strength.  Mental Status: Judgment, insight: Intact Orientation: Oriented to time, place, and person Mood and affect: No depression, anxiety, or agitation     DATA REVIEWED:   ASSESSMENT/PLAN/RECOMMENDATIONS:   Hyperthyroidism:   - Per pt she has been diagnosed with graves' disease since 2017 during her first pregnancy, pt only seeks medical care during pregnancies.  -    Labs in 4 weeks  F/U in 2 months   Signed electronically by: Mack Guise, MD  [facility]  Endocrinology  [facility] [address] [place], [place] [address] Phone: [phone] FAX: [phone]   CC: Pcp, No No address on file Phone: None Fax: None   Return to Endocrinology clinic as below: Future Appointments  Date Time Provider [place]  01/31/2023  9:10 AM Apoorva Bugay, Melanie Crazier, MD [facility] None

## 2023-08-21 IMAGING — CT CT NECK W/ CM
4 of 5 series · 14 of 33 positions shown, 16 images · IV contrast (APPLIED)
Comparison: None Available.

CLINICAL DATA: Thyroid nodule with left submandibular mass

EXAM:
CT NECK WITH CONTRAST
TECHNIQUE: Multidetector CT imaging of the neck was performed using the
standard protocol following the bolus administration of intravenous
contrast.

[Series 3: axial neck · axial · 0.51mm/px · z∈[+1047,+1155]mm · 3 of 109 slices shown]
[im 28/109  bone]
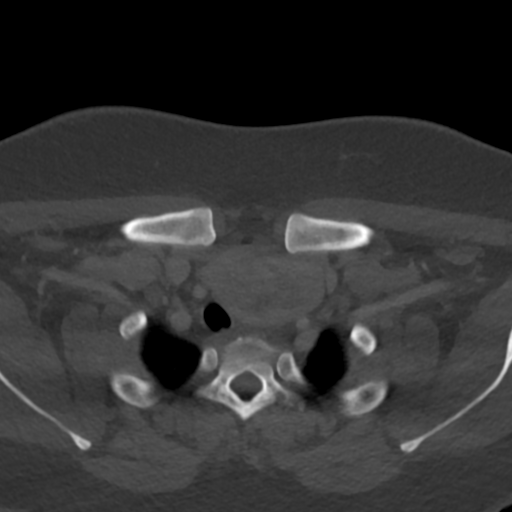
[im 55/109  bone]
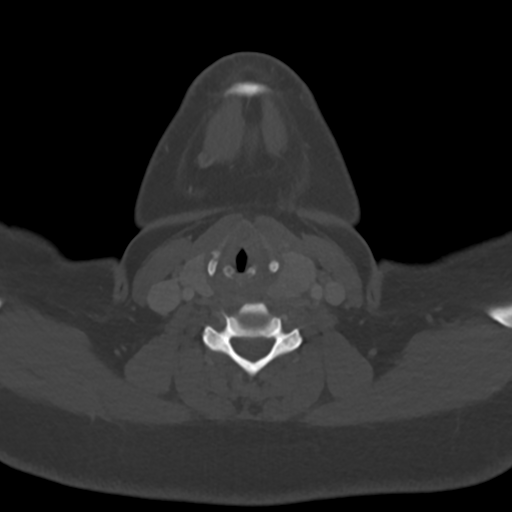
[im 82/109  bone]
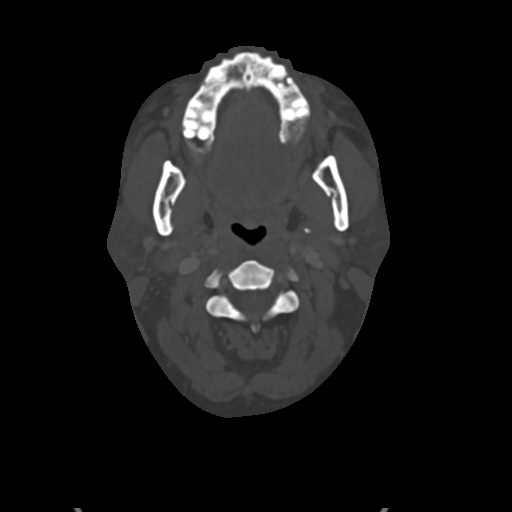

[Series 5: sag neck · sagittal · 0.50mm/px · 5 of 104 slices shown, 6 images]
[im 35/104  bone]
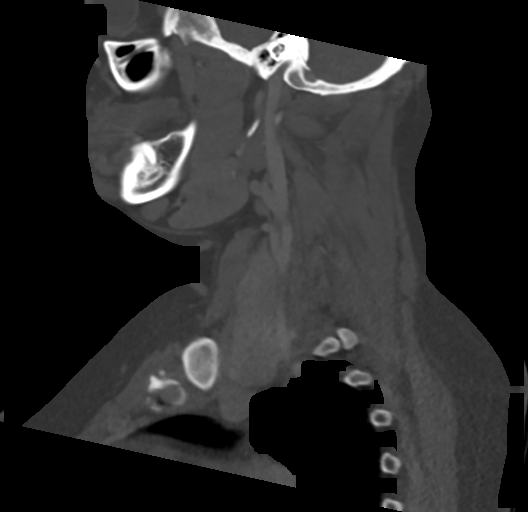
[im 43/104  bone]
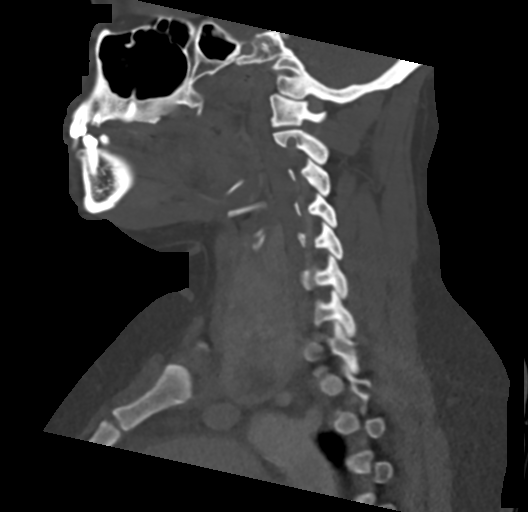
[im 52/104  soft-tissue]
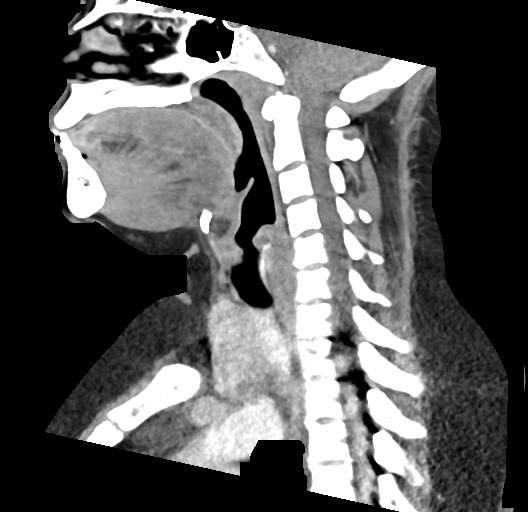
[im 52/104  bone]
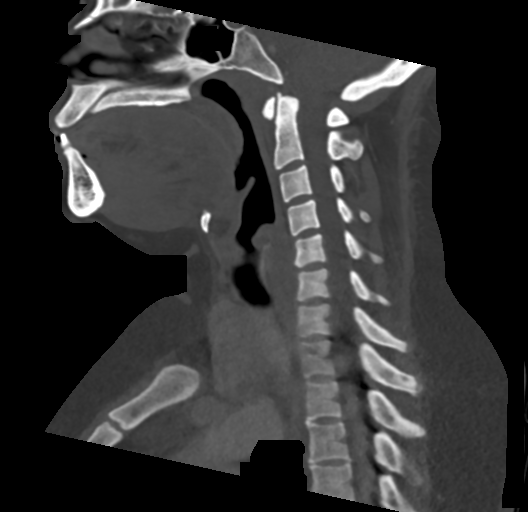
[im 61/104  bone]
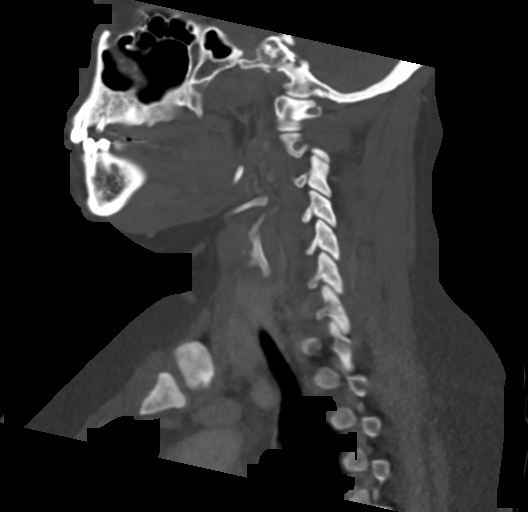
[im 69/104  bone]
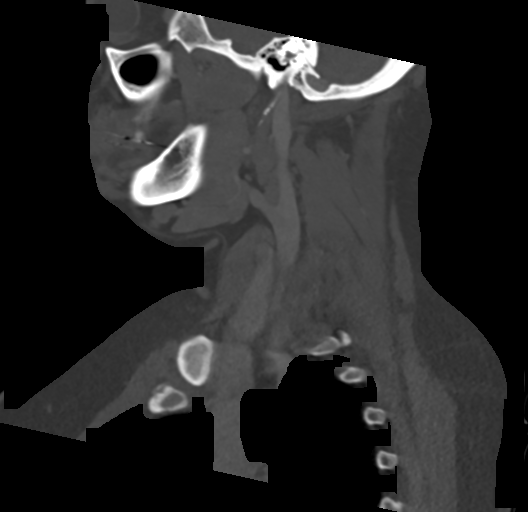

[Series 6: cor neck · coronal · 0.40mm/px · 3 of 132 slices shown]
[im 32/132  bone]
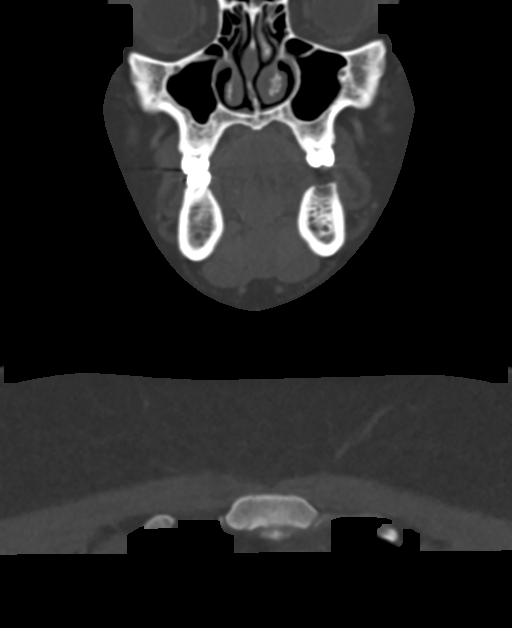
[im 55/132  bone]
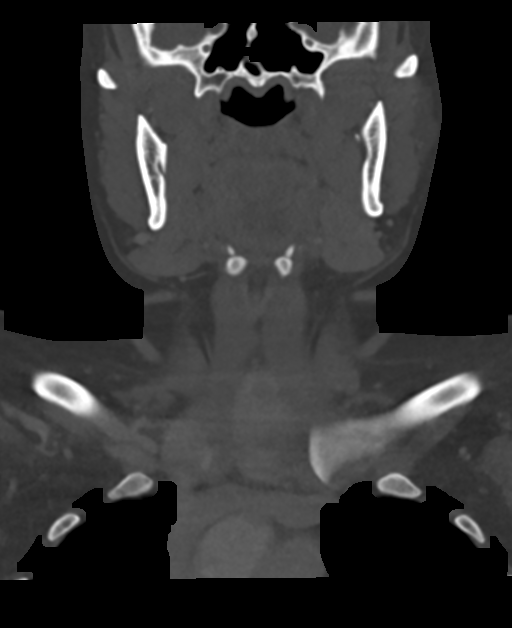
[im 77/132  bone]
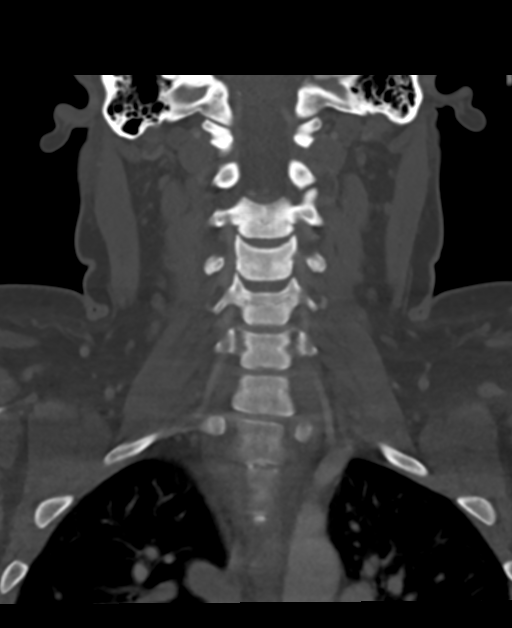

[Series 7: ax oropharynx · axial · 0.40mm/px · z∈[+1011,+1134]mm · 3 of 127 slices shown, 4 images]
[im 32/127  soft-tissue]
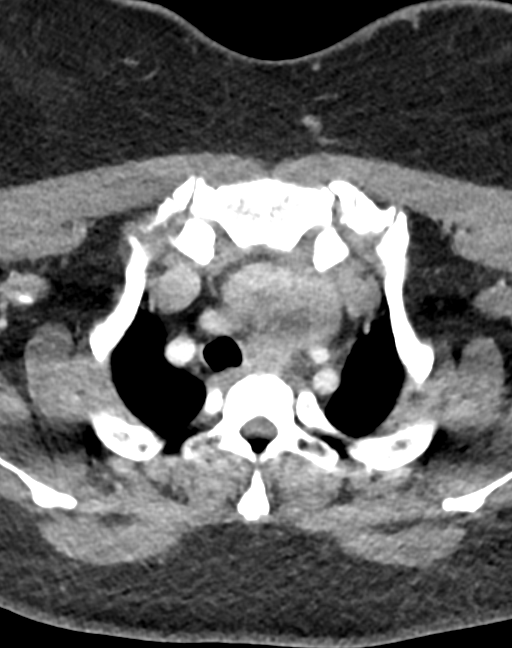
[im 32/127  bone]
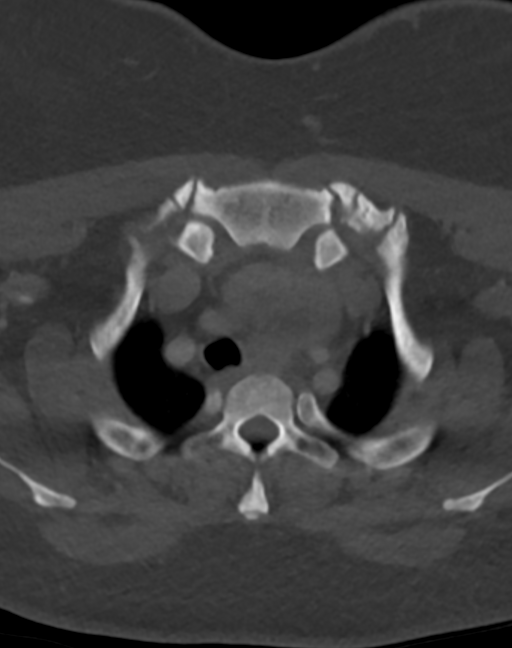
[im 64/127  bone]
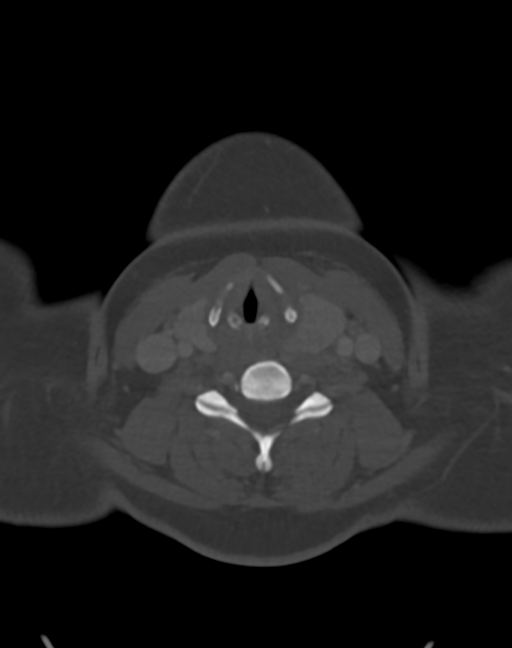
[im 95/127  bone]
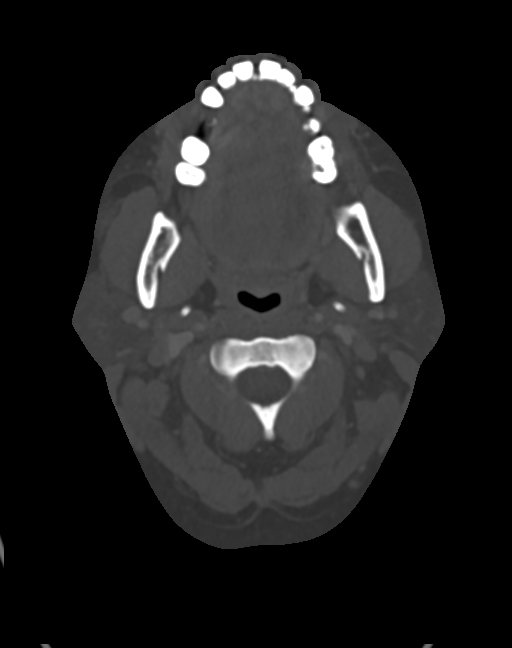

[14 of 33 positions shown; findings below may reference images not displayed]

RADIATION DOSE REDUCTION: This exam was performed according to the
departmental dose-optimization program which includes automated
exposure control, adjustment of the mA and/or kV according to
patient size and/or use of iterative reconstruction technique.

CONTRAST:  75mL OMNIPAQUE IOHEXOL 300 MG/ML  SOLN
FINDINGS: PHARYNX AND LARYNX: The nasopharynx, oropharynx and larynx are
normal. Visible portions of the oral cavity, tongue base and floor
of mouth are normal. Normal epiglottis, vallecula and pyriform
sinuses. The larynx is normal. No retropharyngeal abscess, effusion
or lymphadenopathy.

SALIVARY GLANDS: Normal parotid, submandibular and sublingual
glands.

THYROID: Enlarged and heterogeneous left thyroid lobe.

LYMPH NODES: Left level 2A cervical lymph node measures 1.2 cm. No
other enlarged or abnormal density lymph nodes. There are multiple
subcentimeter submandibular lymph nodes bilaterally.

VASCULAR: Major cervical vessels are patent.

LIMITED INTRACRANIAL: Normal.

VISUALIZED ORBITS: Normal.

MASTOIDS AND VISUALIZED PARANASAL SINUSES: No fluid levels or
advanced mucosal thickening. No mastoid effusion.

SKELETON: No bony spinal canal stenosis. No lytic or blastic
lesions.

UPPER CHEST: Clear.

OTHER: None.
IMPRESSION: 1. Heterogeneous and enlarged thyroid. Recommend thyroid US.
Reference: [HOSPITAL]. [DATE]): 143-50
2. Mildly enlarged left level 2A cervical lymph node, measuring
cm. No other enlarged or abnormal density lymph nodes.

## 2023-10-17 ENCOUNTER — Ambulatory Visit: Payer: Medicaid Other | Admitting: Podiatry

## 2023-10-17 DIAGNOSIS — Z91199 Patient's noncompliance with other medical treatment and regimen due to unspecified reason: Secondary | ICD-10-CM

## 2023-10-17 NOTE — Progress Notes (Signed)
 Patient absent for apointment

## 2023-11-21 ENCOUNTER — Ambulatory Visit: Payer: Medicaid Other | Admitting: Podiatry

## 2024-01-14 ENCOUNTER — Other Ambulatory Visit: Payer: Self-pay | Admitting: Registered Nurse

## 2024-01-14 DIAGNOSIS — N644 Mastodynia: Secondary | ICD-10-CM

## 2024-01-15 ENCOUNTER — Other Ambulatory Visit: Payer: Self-pay

## 2024-01-15 DIAGNOSIS — E0500 Thyrotoxicosis with diffuse goiter without thyrotoxic crisis or storm: Secondary | ICD-10-CM

## 2024-01-17 ENCOUNTER — Ambulatory Visit: Payer: No Typology Code available for payment source | Admitting: Nurse Practitioner

## 2024-01-22 ENCOUNTER — Encounter: Payer: Self-pay | Admitting: Nurse Practitioner

## 2024-01-22 ENCOUNTER — Ambulatory Visit: Payer: No Typology Code available for payment source | Attending: Nurse Practitioner | Admitting: Nurse Practitioner

## 2024-01-22 NOTE — Progress Notes (Deleted)
Office Visit    Patient Name: Michelle Osborn Date of Encounter: 01/22/2024  Primary Care Provider:  Levonne Lapping, NP Primary Cardiologist:  Julien Nordmann, MD  Chief Complaint    38 y.o. female with history of CAD, hypertension, hyperlipidemia, diabetes, tobacco abuse, PCOS, obesity, Graves' disease, anxiety, who presents for follow-up related to ***  Past Medical History  Subjective   Past Medical History:  Diagnosis Date   Anemia    Anxiety    Asthma    "grew out' not since teenager   Bipolar 1 disorder (HCC)    CAD (coronary artery disease)    a. 03/2016 NSTEMI/PCI: DES to LAD and D1.   Depression    Diabetes mellitus    not currently on meds, normal A1c   Discoloration of skin of foot 02/2020   pt wakes up with purple feet every am. it dissipates as day goes on.   Graves disease    History of echocardiogram    a. 08/2019 Echo: EF 60-65%, no signif valvular dzs; b. 10/2022 Echo: EF 60-65%, no rwma, nl RV fxn, no significant valvular disease.   Hydradenitis    Hyperlipidemia LDL goal <70    Hypertension    Hyperthyroidism    MI (myocardial infarction) (HCC) 2017   Neuropathy    Syncope    a. 11/2020 Zio: Predominantly sinus rhythm, average heart rate 92, 5 brief SVT runs.  Triggered events not associated with significant arrhythmias; b. 09/2022 Zio: Predominantly sinus rhythm, average 87 (50-211).  8 SVT runs (fastest 211, longest 16 beats).  Rare PACs/PVCs.  Triggered events (23) associate with sinus rhythm, 1 episode associated short run SVT, PACs.   Tachycardia    Past Surgical History:  Procedure Laterality Date   ADENOIDECTOMY  2003   age 38   APPENDECTOMY  59   CESAREAN SECTION  2018   2020. 2021   CESAREAN SECTION N/A 05/05/2019   Procedure: REPEAT CESAREAN SECTION;  Surgeon: Linzie Collin, MD;  Location: [facility];  Service: Obstetrics;  Laterality: N/A;   CESAREAN SECTION WITH BILATERAL TUBAL LIGATION Bilateral 03/25/2020   Procedure: CESAREAN  SECTION WITH BILATERAL TUBAL LIGATION;  Surgeon: Hildred Laser, MD;  Location: [facility];  Service: Obstetrics;  Laterality: Bilateral;  Repeat   CHOLECYSTECTOMY N/A 11/06/2022   Procedure: LAPAROSCOPIC CHOLECYSTECTOMY;  Surgeon: Quentin Ore, MD;  Location: [facility];  Service: General;  Laterality: N/A;   CORONARY ANGIOPLASTY WITH STENT PLACEMENT  03/2016   2 stents   DILATION AND CURETTAGE OF UTERUS  2014   EXCISION OF HYDRADENITIS  2009, 2012   REMOVAL OF SWEAT GLANDS   TONSILLECTOMY     2003    Allergies  Allergies  Allergen Reactions   Darvocet [Propoxyphene N-Acetaminophen] Anaphylaxis    Also makes her stomach hurt   Peanut-Containing Drug Products Shortness Of Breath, Swelling and Itching   Penicillins Shortness Of Breath, Swelling and Anaphylaxis    Did it involve swelling of the face/tongue/throat, SOB, or low BP? Yes Did it involve sudden or severe rash/hives, skin peeling, or any reaction on the inside of your mouth or nose? No Did you need to seek medical attention at a hospital or doctor's office? Yes When did it last happen?      2007 If all above answers are "NO", may proceed with cephalosporin use.  Did it involve swelling of the face/tongue/throat, SOB, or low BP? Yes Did it involve sudden or severe rash/hives, skin peeling, or any reaction on  the inside of your mouth or nose? No Did you need to seek medical attention at a hospital or doctor's office? Yes When did it last happen?      2007 If all above answers are "NO", may proceed with cephalosporin use.  throat   Tomato Itching   Keflex [Cephalexin] Hives and Itching   Sulfa Antibiotics Other (See Comments)   Imitrex [Sumatriptan] Nausea And Vomiting   Toradol [Ketorolac Tromethamine] Rash    Red rash with bumps   Tramadol Nausea And Vomiting      History of Present Illness      38 y.o. y/o female with above past medical history including CAD, hypertension, hyperlipidemia, diabetes, tobacco abuse, PCOS,  obesity, Graves' disease, and anxiety. In April 2017, she suffered a non-STEMI and was treated at [facility] with drug-eluting stent to the LAD and first diagonal. She has since been followed by Dr. Mariah Milling. 2D echocardiogram in August 2020, showed an EF of 60 to 65% without any significant valvular disease.  She has a history of presyncope and syncope dating back to at least 2020.  ZIO monitor in December 2021 showed predominantly sinus rhythm with 5 brief runs of SVT.  In October 2023, she was evaluated for preoperative clearance, pending laparoscopic cholecystectomy in the setting of cholelithiasis.  She reported intermittent palpitations and syncopal episodes occurring 2-3 times per month.  Repeat echo showed an EF of 60 to 65% with normal RV function and no significant valvular disease.  Repeat event monitoring showed predominantly sinus rhythm with an average rate of 87 and 8 brief SVT runs, the fastest up to 211 x 16 beats.  22 triggered events were associated with sinus rhythm while 1 was associate with a short run of SVT and PACs.  She was conservatively managed.     Objective  Home Medications    Current Outpatient Medications  Medication Sig Dispense Refill   aspirin EC 81 MG tablet Take 1 tablet (81 mg total) by mouth daily. Swallow whole. 90 tablet 3   clindamycin (CLEOCIN T) 1 % external solution Apply topically 2 (two) times daily. STOP DOXYCYCLINE 60 mL 0   escitalopram (LEXAPRO) 5 MG tablet Take 5 mg by mouth at bedtime.     hydrocortisone (ANUSOL-HC) 2.5 % rectal cream Place 1 Application rectally 2 (two) times daily as needed for hemorrhoids or anal itching.     ibuprofen (ADVIL) 200 MG tablet Take 200-400 mg by mouth every 8 (eight) hours as needed (pain.).     linaclotide (LINZESS) 72 MCG capsule Take 72 mcg by mouth at bedtime.     pantoprazole (PROTONIX) 40 MG tablet Take 40 mg by mouth at bedtime.     QUEtiapine (SEROQUEL) 100 MG tablet Take 300 mg by mouth at bedtime.      rosuvastatin (CRESTOR) 10 MG tablet Take 10 mg by mouth at bedtime.     sucralfate (CARAFATE) 1 GM/10ML suspension Take 10 mLs (1 g total) by mouth 3 (three) times daily with meals. (Patient taking differently: Take 1 g by mouth 3 (three) times daily as needed (swelling).) 420 mL 0   No current facility-administered medications for this visit.     Physical Exam    VS:  There were no vitals taken for this visit. , BMI There is no height or weight on file to calculate BMI.       GEN: Well nourished, well developed, in no acute distress. HEENT: normal. Neck: Supple, no JVD, carotid bruits, or masses.  Cardiac: RRR, no murmurs, rubs, or gallops. No clubbing, cyanosis, edema.  Radials 2+/PT 2+ and equal bilaterally.  Respiratory:  Respirations regular and unlabored, clear to auscultation bilaterally. GI: Soft, nontender, nondistended, BS + x 4. MS: no deformity or atrophy. Skin: warm and dry, no rash. Neuro:  Strength and sensation are intact. Psych: Normal affect.  Accessory Clinical Findings    ECG personally reviewed by me today -    *** - no acute changes.  Lab Results  Component Value Date   WBC 7.9 11/05/2022   HGB 12.2 11/05/2022   HCT 38.0 11/05/2022   MCV 85.2 11/05/2022   PLT 285 11/05/2022   Lab Results  Component Value Date   CREATININE 0.70 11/05/2022   BUN 8 11/05/2022   NA 138 11/05/2022   K 4.2 11/05/2022   CL 109 11/05/2022   CO2 25 11/05/2022   Lab Results  Component Value Date   ALT 16 10/04/2022   AST 17 10/04/2022   ALKPHOS 124 10/04/2022   BILITOT 0.2 (L) 10/04/2022   Lab Results  Component Value Date   CHOL 176 11/18/2009   HDL 37 (L) 11/18/2009   LDLCALC 127 (H) 11/18/2009   TRIG 62 11/18/2009   CHOLHDL 4.8 Ratio 11/18/2009    Lab Results  Component Value Date   HGBA1C 4.8 12/17/2019   Lab Results  Component Value Date   TSH 0.107 (L) 05/16/2022       Assessment & Plan    1.  ***  Nicolasa Ducking, NP 01/22/2024, 9:05  AM

## 2024-01-23 ENCOUNTER — Other Ambulatory Visit: Payer: No Typology Code available for payment source

## 2024-01-28 ENCOUNTER — Ambulatory Visit: Payer: No Typology Code available for payment source | Admitting: "Endocrinology

## 2024-04-08 ENCOUNTER — Ambulatory Visit: Admitting: Internal Medicine

## 2024-04-29 ENCOUNTER — Encounter: Payer: Self-pay | Admitting: Nurse Practitioner

## 2024-04-29 ENCOUNTER — Ambulatory Visit: Attending: Nurse Practitioner | Admitting: Nurse Practitioner

## 2024-04-29 NOTE — Progress Notes (Deleted)
 Office Visit    Patient Name: Michelle Osborn Date of Encounter: 04/29/2024  Primary Care Provider:  Tann, Samandra, NP Primary Cardiologist:  Belva Boyden, MD  Chief Complaint    38 y.o. female with a history of CAD, hypertension, hyperlipidemia, diabetes, tobacco abuse, PCOS, obesity, Graves' disease, and anxiety, who presents for follow-up related to ***  Past Medical History  Subjective   Past Medical History:  Diagnosis Date   Anemia    Anxiety    Asthma    "grew out' not since teenager   Bipolar 1 disorder (HCC)    CAD (coronary artery disease)    a. 03/2016 NSTEMI/PCI: DES to LAD and D1.   Depression    Diabetes mellitus    not currently on meds, normal A1c   Discoloration of skin of foot 02/2020   pt wakes up with purple feet every am. it dissipates as day goes on.   Graves disease    History of echocardiogram    a. 08/2019 Echo: EF 60-65%, no signif valvular dzs; b. 10/2022 Echo: EF 60-65%, no rwma, nl RV fxn, no significant valvular disease.   Hydradenitis    Hyperlipidemia LDL goal <70    Hypertension    Hyperthyroidism    MI (myocardial infarction) (HCC) 2017   Neuropathy    Syncope    a. 11/2020 Zio: Predominantly sinus rhythm, average heart rate 92, 5 brief SVT runs.  Triggered events not associated with significant arrhythmias; b. 09/2022 Zio: Predominantly sinus rhythm, average 87 (50-211).  8 SVT runs (fastest 211, longest 16 beats).  Rare PACs/PVCs.  Triggered events (23) associate with sinus rhythm, 1 episode associated short run SVT, PACs.   Tachycardia    Past Surgical History:  Procedure Laterality Date   ADENOIDECTOMY  2003   age 56   APPENDECTOMY  15   CESAREAN SECTION  2018   2020. 2021   CESAREAN SECTION N/A 05/05/2019   Procedure: REPEAT CESAREAN SECTION;  Surgeon: Zenobia Hila, MD;  Location: [facility];  Service: Obstetrics;  Laterality: N/A;   CESAREAN SECTION WITH BILATERAL TUBAL LIGATION Bilateral 03/25/2020   Procedure:  CESAREAN SECTION WITH BILATERAL TUBAL LIGATION;  Surgeon: Teresa Fender, MD;  Location: [facility];  Service: Obstetrics;  Laterality: Bilateral;  Repeat   CHOLECYSTECTOMY N/A 11/06/2022   Procedure: LAPAROSCOPIC CHOLECYSTECTOMY;  Surgeon: Junie Olds, MD;  Location: [facility];  Service: General;  Laterality: N/A;   CORONARY ANGIOPLASTY WITH STENT PLACEMENT  03/2016   2 stents   DILATION AND CURETTAGE OF UTERUS  2014   EXCISION OF HYDRADENITIS  2009, 2012   REMOVAL OF SWEAT GLANDS   TONSILLECTOMY     2003    Allergies  Allergies  Allergen Reactions   Darvocet [Propoxyphene N-Acetaminophen ] Anaphylaxis    Also makes her stomach hurt   Peanut-Containing Drug Products Shortness Of Breath, Swelling and Itching   Penicillins Shortness Of Breath, Swelling and Anaphylaxis    Did it involve swelling of the face/tongue/throat, SOB, or low BP? Yes Did it involve sudden or severe rash/hives, skin peeling, or any reaction on the inside of your mouth or nose? No Did you need to seek medical attention at a hospital or doctor's office? Yes When did it last happen?      2007 If all above answers are "NO", may proceed with cephalosporin use.  Did it involve swelling of the face/tongue/throat, SOB, or low BP? Yes Did it involve sudden or severe rash/hives, skin peeling, or any  reaction on the inside of your mouth or nose? No Did you need to seek medical attention at a hospital or doctor's office? Yes When did it last happen?      2007 If all above answers are "NO", may proceed with cephalosporin use.  throat   Tomato Itching   Keflex [Cephalexin] Hives and Itching   Sulfa  Antibiotics Other (See Comments)   Imitrex [Sumatriptan] Nausea And Vomiting   Toradol [Ketorolac Tromethamine ] Rash    Red rash with bumps   Tramadol  Nausea And Vomiting      History of Present Illness      38 y.o. y/o female  with a history of CAD, hypertension, hyperlipidemia, diabetes, tobacco abuse, PCOS, obesity,  Graves' disease, and anxiety.  In April 2017, she suffered a non-STEMI and was treated at [facility] with drug-eluting stent to the LAD and first diagonal. She has since been followed by Dr. Gollan. 2D echocardiogram in August 2020, showed an EF of 60 to 65% without any significant valvular disease. In late 2020, she reported episodic syncope and a ZIO monitor was ordered. This never resulted as it was apparently lost in the mail. Zio was reordered in 11/2020, and showed predominantly sinus rhythm with 5 brief SVT runs.  Triggered events were not associated with any significant arrhythmias.    In October 2023, she reported dyspnea, palpitations, and syncope.  Event monitor showed predominantly sinus rhythm with late brief SVT runs (fastest 211, longest 16), and rare PVCs/PACs.  Triggered events were associated with sinus rhythm, 1 short run of SVT, and PACs.  Echo in November 2023 showed an EF of 60 to 65% without regional wall motion abnormalities, normal RV function, and no significant valvular disease.    Objective  Home Medications    Current Outpatient Medications  Medication Sig Dispense Refill   aspirin  EC 81 MG tablet Take 1 tablet (81 mg total) by mouth daily. Swallow whole. 90 tablet 3   clindamycin  (CLEOCIN  T) 1 % external solution Apply topically 2 (two) times daily. STOP DOXYCYCLINE  60 mL 0   escitalopram (LEXAPRO) 5 MG tablet Take 5 mg by mouth at bedtime.     hydrocortisone (ANUSOL-HC) 2.5 % rectal cream Place 1 Application rectally 2 (two) times daily as needed for hemorrhoids or anal itching.     ibuprofen  (ADVIL ) 200 MG tablet Take 200-400 mg by mouth every 8 (eight) hours as needed (pain.).     linaclotide (LINZESS) 72 MCG capsule Take 72 mcg by mouth at bedtime.     pantoprazole  (PROTONIX ) 40 MG tablet Take 40 mg by mouth at bedtime.     QUEtiapine (SEROQUEL) 100 MG tablet Take 300 mg by mouth at bedtime.     rosuvastatin (CRESTOR) 10 MG tablet Take 10 mg by mouth at  bedtime.     sucralfate  (CARAFATE ) 1 GM/10ML suspension Take 10 mLs (1 g total) by mouth 3 (three) times daily with meals. (Patient taking differently: Take 1 g by mouth 3 (three) times daily as needed (swelling).) 420 mL 0   No current facility-administered medications for this visit.     Physical Exam    VS:  There were no vitals taken for this visit. , BMI There is no height or weight on file to calculate BMI.       GEN: Well nourished, well developed, in no acute distress. HEENT: normal. Neck: Supple, no JVD, carotid bruits, or masses. Cardiac: RRR, no murmurs, rubs, or gallops. No clubbing, cyanosis, edema.  Radials  2+/PT 2+ and equal bilaterally.  Respiratory:  Respirations regular and unlabored, clear to auscultation bilaterally. GI: Soft, nontender, nondistended, BS + x 4. MS: no deformity or atrophy. Skin: warm and dry, no rash. Neuro:  Strength and sensation are intact. Psych: Normal affect.  Accessory Clinical Findings    ECG personally reviewed by me today -    *** - no acute changes.  Lab Results  Component Value Date   WBC 7.9 11/05/2022   HGB 12.2 11/05/2022   HCT 38.0 11/05/2022   MCV 85.2 11/05/2022   PLT 285 11/05/2022   Lab Results  Component Value Date   CREATININE 0.70 11/05/2022   BUN 8 11/05/2022   NA 138 11/05/2022   K 4.2 11/05/2022   CL 109 11/05/2022   CO2 25 11/05/2022   Lab Results  Component Value Date   ALT 16 10/04/2022   AST 17 10/04/2022   ALKPHOS 124 10/04/2022   BILITOT 0.2 (L) 10/04/2022   Lab Results  Component Value Date   CHOL 176 11/18/2009   HDL 37 (L) 11/18/2009   LDLCALC 127 (H) 11/18/2009   TRIG 62 11/18/2009   CHOLHDL 4.8 Ratio 11/18/2009    Lab Results  Component Value Date   HGBA1C 4.8 12/17/2019   Lab Results  Component Value Date   TSH 0.107 (L) 05/16/2022       Assessment & Plan    1.  ***  Laneta Pintos, NP 04/29/2024, 8:12 AM

## 2024-06-03 ENCOUNTER — Ambulatory Visit: Admitting: "Endocrinology

## 2024-09-14 ENCOUNTER — Ambulatory Visit: Admitting: "Endocrinology

## 2024-10-07 ENCOUNTER — Telehealth: Payer: Self-pay

## 2024-10-07 NOTE — Telephone Encounter (Signed)
 Received referral from [facility] [phone]) for OSA - eval of sleep apnea, snoring, apnea, daytime fatigue. Placed in sleep mailbox

## 2024-11-03 ENCOUNTER — Other Ambulatory Visit: Payer: Self-pay | Admitting: Physician Assistant

## 2024-11-03 DIAGNOSIS — N921 Excessive and frequent menstruation with irregular cycle: Secondary | ICD-10-CM

## 2024-12-08 ENCOUNTER — Ambulatory Visit: Admitting: "Endocrinology
